# Patient Record
Sex: Female | Born: 1960
Health system: Southern US, Community
[De-identification: ages and names within clinical notes are randomized; demographics above are authoritative.]

## PROBLEM LIST (undated history)

## (undated) DIAGNOSIS — Z800 Family history of malignant neoplasm of digestive organs: Secondary | ICD-10-CM

## (undated) DIAGNOSIS — Z86018 Personal history of other benign neoplasm: Secondary | ICD-10-CM

## (undated) DIAGNOSIS — Z860100 Personal history of colon polyps, unspecified: Secondary | ICD-10-CM

## (undated) DIAGNOSIS — Z803 Family history of malignant neoplasm of breast: Secondary | ICD-10-CM

## (undated) DIAGNOSIS — E039 Hypothyroidism, unspecified: Secondary | ICD-10-CM

## (undated) DIAGNOSIS — E042 Nontoxic multinodular goiter: Secondary | ICD-10-CM

## (undated) DIAGNOSIS — S83206A Unspecified tear of unspecified meniscus, current injury, right knee, initial encounter: Secondary | ICD-10-CM

## (undated) DIAGNOSIS — Z9289 Personal history of other medical treatment: Secondary | ICD-10-CM

## (undated) DIAGNOSIS — E785 Hyperlipidemia, unspecified: Secondary | ICD-10-CM

## (undated) DIAGNOSIS — Z872 Personal history of diseases of the skin and subcutaneous tissue: Secondary | ICD-10-CM

## (undated) DIAGNOSIS — M1990 Unspecified osteoarthritis, unspecified site: Secondary | ICD-10-CM

## (undated) DIAGNOSIS — Z889 Allergy status to unspecified drugs, medicaments and biological substances status: Secondary | ICD-10-CM

## (undated) HISTORY — DX: Family history of malignant neoplasm of digestive organs: Z80.0

## (undated) HISTORY — DX: Unspecified osteoarthritis, unspecified site: M19.90

## (undated) HISTORY — PX: TONSILLECTOMY: SUR1361

## (undated) HISTORY — DX: Personal history of colonic polyps: Z86.010

## (undated) HISTORY — PX: KNEE SURGERY: SHX244

## (undated) HISTORY — PX: WISDOM TOOTH EXTRACTION: SHX21

## (undated) HISTORY — PX: KNEE ARTHROSCOPY: SUR90

## (undated) HISTORY — DX: Personal history of other medical treatment: Z92.89

## (undated) HISTORY — DX: Family history of malignant neoplasm of breast: Z80.3

## (undated) HISTORY — PX: COLONOSCOPY: SHX5424

## (undated) HISTORY — DX: Personal history of colon polyps, unspecified: Z86.0100

## (undated) HISTORY — DX: Nontoxic multinodular goiter: E04.2

---

## 1998-10-19 ENCOUNTER — Ambulatory Visit (HOSPITAL_COMMUNITY): Admission: RE | Admit: 1998-10-19 | Discharge: 1998-10-19 | Payer: Self-pay | Admitting: Gastroenterology

## 1998-12-24 ENCOUNTER — Ambulatory Visit (HOSPITAL_BASED_OUTPATIENT_CLINIC_OR_DEPARTMENT_OTHER): Admission: RE | Admit: 1998-12-24 | Discharge: 1998-12-24 | Payer: Self-pay | Admitting: Orthopedic Surgery

## 1999-11-04 ENCOUNTER — Other Ambulatory Visit: Admission: RE | Admit: 1999-11-04 | Discharge: 1999-11-04 | Payer: Self-pay | Admitting: Family Medicine

## 1999-11-12 ENCOUNTER — Encounter: Payer: Self-pay | Admitting: Family Medicine

## 1999-11-12 ENCOUNTER — Ambulatory Visit (HOSPITAL_COMMUNITY): Admission: RE | Admit: 1999-11-12 | Discharge: 1999-11-12 | Payer: Self-pay | Admitting: Family Medicine

## 2000-09-12 ENCOUNTER — Ambulatory Visit (HOSPITAL_COMMUNITY): Admission: RE | Admit: 2000-09-12 | Discharge: 2000-09-12 | Payer: Self-pay | Admitting: Endocrinology

## 2000-09-12 ENCOUNTER — Encounter: Payer: Self-pay | Admitting: Endocrinology

## 2000-11-03 ENCOUNTER — Other Ambulatory Visit: Admission: RE | Admit: 2000-11-03 | Discharge: 2000-11-03 | Payer: Self-pay | Admitting: Family Medicine

## 2000-12-02 ENCOUNTER — Inpatient Hospital Stay (HOSPITAL_COMMUNITY): Admission: AD | Admit: 2000-12-02 | Discharge: 2000-12-07 | Payer: Self-pay | Admitting: Thoracic Surgery

## 2000-12-04 HISTORY — PX: OTHER SURGICAL HISTORY: SHX169

## 2000-12-19 ENCOUNTER — Ambulatory Visit (HOSPITAL_COMMUNITY): Admission: RE | Admit: 2000-12-19 | Discharge: 2000-12-19 | Payer: Self-pay | Admitting: Gastroenterology

## 2001-11-08 ENCOUNTER — Encounter: Payer: Self-pay | Admitting: Endocrinology

## 2001-11-08 ENCOUNTER — Ambulatory Visit (HOSPITAL_COMMUNITY): Admission: RE | Admit: 2001-11-08 | Discharge: 2001-11-08 | Payer: Self-pay | Admitting: Endocrinology

## 2001-11-13 ENCOUNTER — Other Ambulatory Visit: Admission: RE | Admit: 2001-11-13 | Discharge: 2001-11-13 | Payer: Self-pay | Admitting: Family Medicine

## 2002-07-10 ENCOUNTER — Encounter: Payer: Self-pay | Admitting: Endocrinology

## 2002-07-10 ENCOUNTER — Ambulatory Visit (HOSPITAL_COMMUNITY): Admission: RE | Admit: 2002-07-10 | Discharge: 2002-07-10 | Payer: Self-pay | Admitting: Endocrinology

## 2002-11-15 ENCOUNTER — Other Ambulatory Visit: Admission: RE | Admit: 2002-11-15 | Discharge: 2002-11-15 | Payer: Self-pay | Admitting: Family Medicine

## 2002-12-23 ENCOUNTER — Ambulatory Visit (HOSPITAL_COMMUNITY): Admission: RE | Admit: 2002-12-23 | Discharge: 2002-12-23 | Payer: Self-pay | Admitting: Gastroenterology

## 2003-11-17 ENCOUNTER — Other Ambulatory Visit: Admission: RE | Admit: 2003-11-17 | Discharge: 2003-11-17 | Payer: Self-pay | Admitting: Family Medicine

## 2004-07-05 ENCOUNTER — Ambulatory Visit (HOSPITAL_COMMUNITY): Admission: RE | Admit: 2004-07-05 | Discharge: 2004-07-05 | Payer: Self-pay | Admitting: Endocrinology

## 2005-03-04 ENCOUNTER — Other Ambulatory Visit: Admission: RE | Admit: 2005-03-04 | Discharge: 2005-03-04 | Payer: Self-pay | Admitting: Family Medicine

## 2005-08-19 ENCOUNTER — Ambulatory Visit (HOSPITAL_COMMUNITY): Admission: RE | Admit: 2005-08-19 | Discharge: 2005-08-19 | Payer: Self-pay | Admitting: Gastroenterology

## 2005-09-09 ENCOUNTER — Ambulatory Visit (HOSPITAL_COMMUNITY): Admission: RE | Admit: 2005-09-09 | Discharge: 2005-09-09 | Payer: Self-pay | Admitting: Endocrinology

## 2005-10-27 ENCOUNTER — Ambulatory Visit (HOSPITAL_COMMUNITY): Admission: RE | Admit: 2005-10-27 | Discharge: 2005-10-27 | Payer: Self-pay | Admitting: Family Medicine

## 2006-02-02 ENCOUNTER — Encounter: Payer: Self-pay | Admitting: Orthopedic Surgery

## 2006-02-02 ENCOUNTER — Ambulatory Visit (HOSPITAL_COMMUNITY): Admission: RE | Admit: 2006-02-02 | Discharge: 2006-02-02 | Payer: Self-pay | Admitting: Orthopedic Surgery

## 2006-03-09 ENCOUNTER — Other Ambulatory Visit: Admission: RE | Admit: 2006-03-09 | Discharge: 2006-03-09 | Payer: Self-pay | Admitting: Family Medicine

## 2006-03-14 ENCOUNTER — Ambulatory Visit: Payer: Self-pay | Admitting: Sports Medicine

## 2006-04-06 ENCOUNTER — Ambulatory Visit: Payer: Self-pay | Admitting: Sports Medicine

## 2006-06-20 ENCOUNTER — Ambulatory Visit: Payer: Self-pay | Admitting: Family Medicine

## 2006-06-28 ENCOUNTER — Ambulatory Visit: Payer: Self-pay | Admitting: Sports Medicine

## 2007-02-08 ENCOUNTER — Encounter (INDEPENDENT_AMBULATORY_CARE_PROVIDER_SITE_OTHER): Payer: Self-pay | Admitting: Plastic Surgery

## 2007-02-08 ENCOUNTER — Ambulatory Visit (HOSPITAL_BASED_OUTPATIENT_CLINIC_OR_DEPARTMENT_OTHER): Admission: RE | Admit: 2007-02-08 | Discharge: 2007-02-09 | Payer: Self-pay | Admitting: Plastic Surgery

## 2007-02-08 HISTORY — PX: BREAST REDUCTION SURGERY: SHX8

## 2007-10-16 ENCOUNTER — Ambulatory Visit (HOSPITAL_COMMUNITY): Admission: RE | Admit: 2007-10-16 | Discharge: 2007-10-16 | Payer: Self-pay | Admitting: Gastroenterology

## 2007-10-18 ENCOUNTER — Other Ambulatory Visit: Admission: RE | Admit: 2007-10-18 | Discharge: 2007-10-18 | Payer: Self-pay | Admitting: Family Medicine

## 2007-11-28 ENCOUNTER — Ambulatory Visit (HOSPITAL_COMMUNITY): Admission: RE | Admit: 2007-11-28 | Discharge: 2007-11-28 | Payer: Self-pay | Admitting: Endocrinology

## 2008-01-15 ENCOUNTER — Emergency Department (HOSPITAL_COMMUNITY): Admission: EM | Admit: 2008-01-15 | Discharge: 2008-01-15 | Payer: Self-pay | Admitting: Family Medicine

## 2008-01-31 ENCOUNTER — Emergency Department (HOSPITAL_COMMUNITY): Admission: EM | Admit: 2008-01-31 | Discharge: 2008-01-31 | Payer: Self-pay | Admitting: Family Medicine

## 2008-04-16 ENCOUNTER — Ambulatory Visit: Payer: Self-pay | Admitting: Vascular Surgery

## 2008-12-10 ENCOUNTER — Encounter: Admission: RE | Admit: 2008-12-10 | Discharge: 2008-12-10 | Payer: Self-pay | Admitting: Endocrinology

## 2009-10-13 ENCOUNTER — Ambulatory Visit (HOSPITAL_COMMUNITY): Admission: RE | Admit: 2009-10-13 | Discharge: 2009-10-13 | Payer: Self-pay | Admitting: Orthopedic Surgery

## 2010-09-12 ENCOUNTER — Encounter: Payer: Self-pay | Admitting: Orthopedic Surgery

## 2010-12-31 ENCOUNTER — Other Ambulatory Visit: Payer: Self-pay | Admitting: Obstetrics and Gynecology

## 2011-01-04 NOTE — Op Note (Signed)
Erin [name], Erin [name]                ACCOUNT NO.:  [id number]   MEDICAL RECORD NO.:  [id number]          PATIENT TYPE:  AMB   LOCATION:  DSC                          FACILITY:  [facility]   PHYSICIAN:  Etter Sjogren, M.D.     DATE OF BIRTH:  09/20/1960   DATE OF PROCEDURE:  02/08/2007  DATE OF DISCHARGE:                               OPERATIVE REPORT   PREOPERATIVE DIAGNOSIS:  Bilateral macromastia with upper back, neck,  and shoulder pain.   POSTOPERATIVE DIAGNOSIS:  Bilateral macromastia with upper back, neck,  and shoulder pain.   PROCEDURE PERFORMED:  Bilateral breast reduction surgery.   SURGEON:  Etter Sjogren, M.D.   ASSISTANT SURGEON:  Pleas Patricia, M.D.   ANESTHESIA:  General.   ESTIMATED BLOOD LOSS:  225 mL.   CLINICAL NOTE:  A 50 year old woman who desires breast reduction.  She  complains of upper back, neck, and shoulder pain, breast and shoulder  grooving.  The procedure was discussed with her in detail.  She wished  to be a C cup but it was doubtful we could measure quite that small.  However, she was told that we would make her as small as we could.  Her  left breast was slightly larger than the right.  She understood the  initial procedure and the risks.  The informed consent form was  discussed with her in detail.  She understood the risks and possible  complications and wished to proceed.   DESCRIPTION OF PROCEDURE:  The patient was placed in a full standing  position in the holding area and marked for the breast reduction.  She  was taken to the operating room and placed supine after successful  induction of general anesthesia, prepped with Betadine and draped with  sterile drapes.  The 8-cm wide inferior nipple-areolar pedicles were  designed.  A 33-mm marker marked the new nipple-areolar complex.  An  incision was made.  The inferior nipple-areolar pedicle was de-  epithelialized.  The pedicles were then isolated from the surrounding  tissue, beveling in  an outward direction medially and laterally in order  to insure a broader detachment at the level of the chest wall that was  present at the level of the skin.  Resections were performed from medial  to central to lateral.  A total of 654 grams from the smaller right side  and 692 grams from the larger left side.  Thorough irrigation with  saline and meticulous hemostasis with electrocautery.  Excellent  hemostasis having been confirmed at the closure with a 2-0 Vicryl  inverted deep suture in the inverted T position, a 3-0 Monocryl  interrupted inverted deep dermal sutures, and running 4-0 Monocryl  subcuticular suture.  A measurement was then taken 5-cm up from the  inframammary crease.  A 38 mL marker used to mark the nipple-areolar  site.  The tissue was resected.  The nipple-areolar complex was brought  through this opening.  They were inspected.  They had an excellent color  and bright red bleeding around the periphery, consistent with viability.  Nipple-areolar end-settings 3-0 Monocryl interrupted  inverted deep  sutures and 4-0 Monocryl running subcuticular suture.  Steri-Strips, a  dry sterile dressing and circumferential splint applied.   She was transferred to the recovery room stable, having tolerated the  procedure well.   DISPOSITION:  She will be observed in the RCC overnight.      Etter Sjogren, M.D.  Electronically Signed     DB/MEDQ  D:  02/08/2007  T:  02/09/2007  Job:  [id number]

## 2011-01-04 NOTE — Op Note (Signed)
[name], [name]                ACCOUNT NO.:  [id number]   MEDICAL RECORD NO.:  [id number]          PATIENT TYPE:  AMB   LOCATION:  ENDO                         FACILITY:  [facility]   PHYSICIAN:  John C. Madilyn Fireman, M.D.    DATE OF BIRTH:  04/07/1961   DATE OF PROCEDURE:  10/16/2007  DATE OF DISCHARGE:                               OPERATIVE REPORT   PROCEDURE:  Colonoscopy.   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   PROCEDURE:  The patient was placed in the left lateral decubitus  position and placed on pulse monitor, with continuous low-flow oxygen  delivered by nasal cannula.  She was sedated with 100 mcg IV fentanyl  and 8 mg IV Versed.  The Olympus video colonoscope was inserted into the  rectum and advanced to the cecum, confirmed by transillumination at  McBurney's point visualization of the ileocecal valve and the  appendiceal orifice.  The prep was excellent.  The cecum, ascending,  transverse, descending, sigmoid, and rectum all appeared normal, with no  masses, polyps, diverticula, or other mucosal abnormalities.  The scope  was then withdrawn and the patient returned to the recovery room in  stable condition.  She tolerated the procedure well, and there were no  immediate complications.   IMPRESSION:  Normal study.   PLAN:  Will proceed with repeat colonoscopy in 2 years.           ______________________________  Everardo All Madilyn Fireman, M.D.     JCH/MEDQ  D:  10/16/2007  T:  10/16/2007  Job:  [id number]   cc:   Caryn Bee L. Little, M.D.  Fax: [phone]

## 2011-01-04 NOTE — Procedures (Signed)
DUPLEX DEEP VENOUS EXAM - LOWER EXTREMITY   INDICATION:  Left leg varicose veins.   HISTORY:  Edema:  Swelling since recent trauma.  Trauma/Surgery:  Trauma to the left leg in May, 2009.  The patient had  two left knee arthroscopies over five years ago.  Pain:  Ache in left leg.  PE:  No.  Previous DVT:  No.  Anticoagulants:  No.  Other:  Small spider veins in left leg.   DUPLEX EXAM:                CFV   SFV   PopV  PTV    GSV                R  L  R  L  R  L  R   L  R  L  Thrombosis    o  o     o     o      o     o  Spontaneous   +  +     +     +      +     +  Phasic        +  +     +     +      +     +  Augmentation  +  +     +     +      +     +  Compressible  +  +     +     +      +     +  Competent     +  +     +     +      +     +   Legend:  + - yes  o - no  p - partial  D - decreased   IMPRESSION:  No evidence of left leg deep venous thrombosis, superficial  vein thrombus, baker's cyst, or venous reflux.    _____________________________  Larina Earthly, M.D.   MC/MEDQ  D:  04/16/2008  T:  04/16/2008  Job:  [id number]

## 2011-01-04 NOTE — Consult Note (Signed)
NEW PATIENT CONSULTATION   [name], Erin  DOB:  1961-06-20                                       04/16/2008  CHART#:[id number]   The patient presents today for evaluation of left leg discomfort.  She  reports striking her pretibial area on a tube in May and since that time  has had difficulty with pain and swelling over this area.  She describes  this as a generalized ache over the pretibial area.  She does not have  any open wounds at this area.  She has had no fracture from x-ray.  She  has had prior surgeries, arthroscopies to her left knee and also at age  31 had growth plate surgery to her left knee.  She has not been  experiencing significant swelling.  She does have concern regarding mild  spider vein telangiectasia.   PHYSICAL EXAMINATION:  On physical exam a well-developed, well-nourished  female appearing stated age of 50.  Her right dorsalis pedis pulse is 2+  as is her popliteal pulse and femoral pulse.  On the left she has a  palpable femoral pulse and I do not palpate popliteal or pedal pulses.  The foot is well-perfused and warm.  She does not have any swelling and  no varicose veins.  She does have some mild scattered telangiectasia.   She underwent noninvasive vascular laboratory studies in our office.  Her venous study revealed no evidence of deep venous thrombosis and no  evidence of valvular reflux with totally normal venous study.  On her  arterial exam she did have slightly lower pulses in her left leg than  right but her ankle arm index was normal at 1.0.  She did have biphasic  and triphasic signals at the posterior tibia and dorsalis pedis  respectfully.  I discussed this with the patient.  I am unclear as to  the etiology of her leg discomfort but do not see any evidence of  significant arterial or venous pathology.  She was reassured with this  discussion and will see [place] again on an as-needed basis.   Larina Earthly, M.D.  Electronically Signed   TFE/MEDQ  D:  04/16/2008  T:  04/17/2008  Job:  [id number]   cc:   Caryn Bee L. Little, M.D.

## 2011-01-07 NOTE — Procedures (Signed)
[facility]  Patient:    Erin Hall, Erin Hall                         MRN: [id number] Proc. Date: 12/19/00 Adm. Date:  [id number] Attending:  Louie Bun CC:         Anna Genre. Little, M.D.   Procedure Report  PROCEDURE:  Colonoscopy.  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  DESCRIPTION OF PROCEDURE:  The patient was placed in the left lateral decubitus position and placed on the pulse monitor with continuous low-flow oxygen delivered by nasal cannula.  She was sedated with 70 mg IV Demerol and 9 mg IV Versed.  The Olympus video colonoscope was inserted into the rectum and advanced to the cecum, confirmed by transillumination at McBurneys point and visualization of the ileocecal valve and appendiceal orifice.  The prep was fairly good.  There was a moderate amount of semisolid and liquid stool, but this was adequately lavaged and suctioned until an adequate view could be achieved.  The cecum, ascending, transverse, descending, sigmoid, and rectum all appeared normal with no masses, polyps, diverticula, or other mucosal abnormalities.  Retroflex view of the anus did reveal some small internal hemorrhoids with no stigma of hemorrhage.  The colonoscope was then withdrawn and the patient returned to the recovery room in stable condition.  She tolerated the procedure well, and there were no immediate complications.  IMPRESSION:  Small internal hemorrhoids, otherwise normal colonoscopy.  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

## 2011-01-07 NOTE — Op Note (Signed)
   NAMERAGHAD, Erin                            ACCOUNT NO.:  [id number]   MEDICAL RECORD NO.:  [id number]                   PATIENT TYPE:  AMB   LOCATION:  ENDO                                 FACILITY:  [facility]   PHYSICIAN:  John C. Madilyn Fireman, M.D.                 DATE OF BIRTH:  Jan 14, 1961   DATE OF PROCEDURE:  12/23/2002  DATE OF DISCHARGE:                                 OPERATIVE REPORT   PROCEDURE:  Colonoscopy.   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   DESCRIPTION OF PROCEDURE:  The patient was placed in the left lateral  decubitus position then placed on the pulse monitor with continuous low flow  oxygen delivered by nasal cannula. She was sedated with 100 mcg IV fentanyl  and 8 mg IV Versed. The Olympus video colonoscope was inserted into the  rectum and advanced to the cecum, confirmed by transillumination at  McBurney's point and visualization of the ileocecal valve and appendiceal  orifice. The prep was excellent. The cecum, ascending, transverse,  descending and sigmoid colon all appeared normal with no masses, polyps,  diverticula or other mucosal abnormalities. The rectum likewise appeared  normal and retroflexed view of the anus revealed no obvious internal  hemorrhoids. The colonoscope was then withdrawn and the patient returned to  the recovery room in stable condition. The patient tolerated the procedure  well and there were no immediate complications.   IMPRESSION:  Normal colonoscopy.   PLAN:  Based on her family history will repeat colonoscopy in two years.                                               John C. Madilyn Fireman, M.D.    JCH/MEDQ  D:  12/23/2002  T:  12/23/2002  Job:  [id number]   cc:   Caryn Bee L. Little, M.D.  [address]  [place]  [place] [address]  Fax: [phone]

## 2011-01-07 NOTE — Op Note (Signed)
Erin Hall, Erin Hall                ACCOUNT NO.:  [id number]   MEDICAL RECORD NO.:  [id number]          PATIENT TYPE:  AMB   LOCATION:  ENDO                         FACILITY:  [facility]   PHYSICIAN:  John C. Madilyn Fireman, M.D.    DATE OF BIRTH:  [birth year]   DATE OF PROCEDURE:  08/19/2005  DATE OF DISCHARGE:                                 OPERATIVE REPORT   PROCEDURE:  Colonoscopy.   INDICATIONS FOR PROCEDURE:  Family history of colon cancer meeting [place]  criteria for Lynch syndrome.   DESCRIPTION OF PROCEDURE:  The patient was placed in the left lateral  decubitus position and placed on the pulse monitor with continuous low-flow  oxygen delivered by nasal cannula. She was sedated with 100 mcg IV fentanyl  and 10 mg IV Versed. The Olympus video colonoscope was inserted into the  rectum and advanced to the cecum, confirmed by transillumination at  McBurney's point and visualization of the ileocecal valve and appendiceal  orifice. The prep was excellent. The cecum, ascending, transverse descending  and sigmoid colon all appeared normal with  no masses, polyps, diverticula  or other mucosal abnormalities. The rectum likewise appeared normal and  retroflexed view of the anus revealed small internal hemorrhoids. The scope  was then withdrawn and the patient returned to the recovery room in stable  condition. She tolerated the procedure well. There were no immediate  complications.   IMPRESSION:  Normal colonoscopy.   PLAN:  Repeat study in 2 years.           ______________________________  Everardo All Madilyn Fireman, M.D.     JCH/MEDQ  D:  08/19/2005  T:  08/19/2005  Job:  [id number]   cc:   Caryn Bee L. Little, M.D.  Fax: [phone]

## 2011-01-07 NOTE — H&P (Signed)
[place]. [facility]  Patient:    Erin Hall, Erin Hall                         MRN: [id number] Adm. Date:  [id number] Attending:  Cameron Proud CC:         Anna Genre. Little, M.D.   History and Physical  PREOPERATIVE DIAGNOSIS:  Right thigh abscess secondary to dog bite.  POSTOPERATIVE DIAGNOSIS:  Right thigh abscess secondary to dog bite.  OPERATION:  Incision and drainage of right thigh abscess.  SURGEON:  D. Karle Plumber, M.D.  ANESTHESIA:  General.  DESCRIPTION OF PROCEDURE:  After general anesthesia, the right medial thigh was prepped and draped in the usual sterile manner.  The patient sustained a dog bite three weeks ago and, despite antibiotics, it had started increasing in size and was now abscessed.  It was prepped and draped in the usual sterile manner, and then an incision was made over the abscess of approximately 2 cm and dissection was carried down in the subcutaneous tissue. Approximately 20-30 cc of pus was removed.  This was sent for aerobic and anaerobic cultures.  The wound was then packed with iodoform, and then a dry and sterile dressing was applied.  The patient returned to the recovery room in stable condition. DD:  12/04/00 TD:  12/04/00 Job: [id number]

## 2011-01-07 NOTE — Discharge Summary (Signed)
[facility]  Patient:    Erin Hall, Erin Hall                         MRN: [id number] Adm. Date:  [id number] Disc. Date: [id number] Attending:  Cameron Proud Dictator:   Sherrie George, P.A. CC:         Fransisco Hertz, M.D.  Lacretia Leigh. Ninetta Lights, M.D.  Anna Genre Little, M.D.   Discharge Summary  DATE OF BIRTH:  1960-10-18  ADMISSION DIAGNOSES: 1. Cellulitis right thigh secondary to dog bite. 2. Hypothyroidism. 3. History of osteomalacia of the knees.  DISCHARGE DIAGNOSES: 1. Cellulitis right thigh secondary to dog bite. 2. Hypothyroidism. 3. History of osteomalacia of the knees.  PROCEDURES:  Incision and drainage right thigh abscess December 08, 2000.  HISTORY OF PRESENT ILLNESS:  The patient is a 50 year old black female, otherwise healthy, who presented with a three-week history of a dog bite of the right thigh.  At the time the patient had not been on oral antibiotics; however, she cleaned the area and noticed a day later that the area was slightly raised.  It became more erythematous and developed more edema.  She was evaluated by Dr. Edwyna Shell and started on Augmentin and was subsequently admitted for IV antibiotics on December 02, 2000.  PAST MEDICAL HISTORY: 1. Osteomalacia of the left knee. 2. Hypothyroidism. 3. Past history of arthroscopic knee surgery of the left leg by Dr. Madelon Lips.  ADMISSION MEDICATIONS: 1. Synthroid 80 mcg q.d. 2. Ortho-Novum birth control pills 1 q.d. 3. Multivitamin 1 q.d.  ALLERGIES:  None known.  For further history and physical, please see the dictated note.  HOSPITAL COURSE:  The patient was admitted.  She had a rounded area in the midportion of the right thigh which was raised and discolored.  It also felt warmer.  There was no drainage in the area.  The patient was placed on IV Zosyn 4.5 g IV q.6h.  It did not become any worse, but by December 05, 2000, it was Dr. Remo Lipps opinion that it would  require incision and drainage.  The patient was also seen in consultation by Dr. Darlina Sicilian of the infectious disease service.  He was concerned that it could be a ______ , and he followed the patient during the hospital course and agreed with the current treatment.  The patient underwent I&D of the incision on December 04, 2000.  The patient tolerated the procedure well and returned to the floor.  The area was packed open.  Cultures obtained at the time of the I&D have shown no growth so far.  The wounds continue to do well, and infectious disease saw the patient again on December 06, 2000.  It was Dr. Tyrone Apple opinion that she could go home in the a.m. on Augmentin for two weeks.  This was discussed with Dr. Edwyna Shell, who agreed, and the patient was subsequently scheduled for discharge home in the a.m. on December 07, 2000.  DISCHARGE MEDICATIONS:  Will be her preadmission medicines which included the Synthroid, Ortho-Novum, and a multivitamin.  She will also be given: 1. Percocet 1-2 p.o. q.4h. p.r.n. 2. Augmentin 875 mg p.o. q.12h.  FOLLOW-UP:  She will return to see Dr. Edwyna Shell on Tuesday, December 12, 2000, at 3:30 p.m., with further workup and treatment as indicated. DD:  12/06/00 TD:  12/07/00 Job: [id number]

## 2011-01-07 NOTE — H&P (Signed)
[place]. [facility]  Patient:    Erin Hall, Erin Hall                         MRN: [id number] Adm. Date:  12/02/00 Attending:  D. Karle Plumber, M.D. Dictator:   Durenda Age, P.A.-C. CC:         Anna Genre. Little, M.D.   History and Physical  DATE OF BIRTH:  May 25, 1961  CHIEF COMPLAINT:  Right thigh cellulitis.  HISTORY OF PRESENT ILLNESS:  [name] is a pleasant 50 year old black female, otherwise healthy, who presented with a three-week history of dog bite in the right thigh.  At the time, the patient did not take any oral antibiotics. However, she cleaned the area and noticed a day later that that area was slightly raised.  She scratched that area with her nails.  Shortly after that, that area became more erythematous, and was accompanied by edema.  Symptoms did not resolve and the patient sought medical attention.  She was started on Augmentin on November 27, 2000, but apparently, this medication did not produce any significant response.  This area became more raised, and had a surrounding border, erythematous, as well.  Because of these clinical signs, Dr. Edwyna Shell upon evaluation recommended that she be admitted for IV antibiotics and IV fluids in order to resolve the symptoms.  The patient agreed.  PAST MEDICAL HISTORY: 1. Previous history of osteomalacia in the left knee. 2. Hypothyroidism.  PAST SURGICAL HISTORY:  Status post arthroscopic surgery in the left leg secondary to osteomalacia by Dr. Madelon Lips.  MEDICATIONS: 1. Synthroid 80 mcg q.d. 2. Ortho-Novum birth control pills q.d. 3. Multivitamins q.d.  ALLERGIES:  No known drug allergies.  REVIEW OF SYSTEMS:  See HPI and past medical history for significant positives.  No diabetes, kidney disease, asthma, COPD, TIA, CVA, syncope, presyncope, amaurosis fugax, CAD, angina, arrhythmia, MI, PE, DVT, GI bleed, dysuria, or hematuria.  No GERD symptoms.  No CHF or hypertension.  She denies any  fever, chills.  She occasionally experiences nausea, but no vomiting.  No diarrhea.  No GI bleed.  FAMILY HISTORY:  Mother alive, breast cancer.  Father alive, colon cancer.  SOCIAL HISTORY:  Single, no children.  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  She denies any tobacco or alcohol intake.  PHYSICAL EXAMINATION:  GENERAL:  A well-developed, well-nourished 50 year old black female in no acute distress, alert and oriented x 3.  VITAL SIGNS:  Blood pressure 110/80, pulse 88, respirations 18.  HEENT:  Normocephalic, atraumatic.  PERRLA, EOMI.  Funduscopic exam within normal limits.  NECK:  Supple, no JVD, bruits, thyromegaly, or lymphadenopathy.  CHEST:  Symmetrical on inspirations.  No wheezes, rhonchi, or rales. No axillary or supraclavicular lymphadenopathy.  Respirations nonlabored.  CARDIOVASCULAR:  Regular rate and rhythm.  No murmurs, rubs, or gallops.  ABDOMEN:  Soft, nontender.  Bowel sounds x 4.  No hepatosplenomegaly, masses palpable, or abdominal bruits.  GU/RECTAL:  Deferred.  EXTREMITIES:  No clubbing, cyanosis, or edema.  SKIN:  No ulcerations, but in the right thigh, there is a 3 to 4 cm raised cellulitic area, tender to palpation, with elevated temperature, no drainage, and with "target-like" surrounding borders.  These borders extend about 7 to 8 cm from the centrally raised cellulitic area.  Peripheral pulses:  Carotid 2+ bilaterally, femoral, popliteal, dorsalis pedis, and posterior tibialis 2+ bilaterally.  Neurologic grossly normal.  Normal gait.  DTRs 2+ bilaterally. Muscle strength 5/5.  ASSESSMENT AND PLAN:  A 50 year old black female with cellulitic right thigh after a dog bite, admitted for IV Zosyn 4.5 g q.4-6h., and IV fluids. Dr. Edwyna Shell has seen and evaluated this patient prior to the admission and has explained the risks and benefits involving the procedure and the patient has agreed to continue. DD:  12/01/00 TD:   12/01/00 Job: [id number]

## 2011-01-24 ENCOUNTER — Other Ambulatory Visit: Payer: Self-pay | Admitting: Endocrinology

## 2011-01-24 DIAGNOSIS — E049 Nontoxic goiter, unspecified: Secondary | ICD-10-CM

## 2011-01-26 ENCOUNTER — Other Ambulatory Visit (HOSPITAL_COMMUNITY): Payer: Self-pay | Admitting: Endocrinology

## 2011-01-26 DIAGNOSIS — E049 Nontoxic goiter, unspecified: Secondary | ICD-10-CM

## 2011-01-31 ENCOUNTER — Ambulatory Visit (HOSPITAL_COMMUNITY)
Admission: RE | Admit: 2011-01-31 | Discharge: 2011-01-31 | Disposition: A | Discharge status: Home / Self Care | Payer: Commercial Managed Care - PPO | Source: Ambulatory Visit | Attending: Endocrinology | Admitting: Endocrinology

## 2011-01-31 DIAGNOSIS — E042 Nontoxic multinodular goiter: Secondary | ICD-10-CM | POA: Insufficient documentation

## 2011-01-31 DIAGNOSIS — E049 Nontoxic goiter, unspecified: Secondary | ICD-10-CM

## 2011-02-01 ENCOUNTER — Other Ambulatory Visit (HOSPITAL_COMMUNITY): Payer: Self-pay

## 2011-02-02 ENCOUNTER — Other Ambulatory Visit (HOSPITAL_COMMUNITY): Payer: Self-pay

## 2011-02-11 ENCOUNTER — Other Ambulatory Visit (HOSPITAL_COMMUNITY): Payer: Self-pay | Admitting: Endocrinology

## 2011-02-11 ENCOUNTER — Other Ambulatory Visit: Payer: Self-pay | Admitting: Endocrinology

## 2011-02-11 DIAGNOSIS — E041 Nontoxic single thyroid nodule: Secondary | ICD-10-CM

## 2011-02-18 ENCOUNTER — Other Ambulatory Visit: Payer: Self-pay | Admitting: Diagnostic Radiology

## 2011-02-18 ENCOUNTER — Ambulatory Visit (HOSPITAL_COMMUNITY)
Admission: RE | Admit: 2011-02-18 | Discharge: 2011-02-18 | Disposition: A | Discharge status: Home / Self Care | Payer: 59 | Source: Ambulatory Visit | Attending: Endocrinology | Admitting: Endocrinology

## 2011-02-18 DIAGNOSIS — E042 Nontoxic multinodular goiter: Secondary | ICD-10-CM | POA: Insufficient documentation

## 2011-02-18 DIAGNOSIS — E041 Nontoxic single thyroid nodule: Secondary | ICD-10-CM

## 2011-05-19 LAB — POCT RAPID STREP A: Streptococcus, Group A Screen (Direct): NEGATIVE

## 2011-09-27 ENCOUNTER — Other Ambulatory Visit: Payer: Self-pay | Admitting: Endodontics

## 2011-12-19 ENCOUNTER — Other Ambulatory Visit (HOSPITAL_COMMUNITY): Payer: Self-pay | Admitting: Orthopedic Surgery

## 2011-12-19 DIAGNOSIS — M25562 Pain in left knee: Secondary | ICD-10-CM

## 2011-12-22 ENCOUNTER — Ambulatory Visit (HOSPITAL_COMMUNITY)
Admission: RE | Admit: 2011-12-22 | Discharge: 2011-12-22 | Disposition: A | Discharge status: Home / Self Care | Payer: 59 | Source: Ambulatory Visit | Attending: Orthopedic Surgery | Admitting: Orthopedic Surgery

## 2011-12-22 DIAGNOSIS — M7120 Synovial cyst of popliteal space [Baker], unspecified knee: Secondary | ICD-10-CM | POA: Insufficient documentation

## 2011-12-22 DIAGNOSIS — M23329 Other meniscus derangements, posterior horn of medial meniscus, unspecified knee: Secondary | ICD-10-CM | POA: Insufficient documentation

## 2011-12-22 DIAGNOSIS — M1710 Unilateral primary osteoarthritis, unspecified knee: Secondary | ICD-10-CM | POA: Insufficient documentation

## 2011-12-22 DIAGNOSIS — M25562 Pain in left knee: Secondary | ICD-10-CM

## 2012-01-12 ENCOUNTER — Other Ambulatory Visit (HOSPITAL_COMMUNITY): Payer: Self-pay | Admitting: Obstetrics and Gynecology

## 2012-01-12 DIAGNOSIS — D259 Leiomyoma of uterus, unspecified: Secondary | ICD-10-CM

## 2012-01-12 DIAGNOSIS — R140 Abdominal distension (gaseous): Secondary | ICD-10-CM

## 2012-01-18 ENCOUNTER — Ambulatory Visit (HOSPITAL_COMMUNITY)
Admission: RE | Admit: 2012-01-18 | Discharge: 2012-01-18 | Disposition: A | Discharge status: Home / Self Care | Payer: 59 | Source: Ambulatory Visit | Attending: Obstetrics and Gynecology | Admitting: Obstetrics and Gynecology

## 2012-01-18 DIAGNOSIS — R141 Gas pain: Secondary | ICD-10-CM | POA: Insufficient documentation

## 2012-01-18 DIAGNOSIS — R142 Eructation: Secondary | ICD-10-CM | POA: Insufficient documentation

## 2012-01-18 DIAGNOSIS — R140 Abdominal distension (gaseous): Secondary | ICD-10-CM

## 2012-01-18 DIAGNOSIS — D259 Leiomyoma of uterus, unspecified: Secondary | ICD-10-CM | POA: Insufficient documentation

## 2012-01-18 MED ORDER — GADOBENATE DIMEGLUMINE 529 MG/ML IV SOLN
15.0000 mL | Freq: Once | INTRAVENOUS | Status: AC | PRN
  Administered 2012-01-18: 15 mL via INTRAVENOUS

## 2012-01-26 ENCOUNTER — Other Ambulatory Visit (HOSPITAL_COMMUNITY): Payer: Self-pay | Admitting: Endocrinology

## 2012-01-26 ENCOUNTER — Other Ambulatory Visit: Payer: Self-pay | Admitting: Obstetrics and Gynecology

## 2012-01-26 DIAGNOSIS — D219 Benign neoplasm of connective and other soft tissue, unspecified: Secondary | ICD-10-CM

## 2012-01-26 DIAGNOSIS — E049 Nontoxic goiter, unspecified: Secondary | ICD-10-CM

## 2012-02-02 ENCOUNTER — Other Ambulatory Visit (HOSPITAL_COMMUNITY): Payer: 59

## 2012-02-03 ENCOUNTER — Other Ambulatory Visit (HOSPITAL_COMMUNITY): Payer: 59

## 2012-02-15 ENCOUNTER — Ambulatory Visit (HOSPITAL_COMMUNITY)
Admission: RE | Admit: 2012-02-15 | Discharge: 2012-02-15 | Disposition: A | Discharge status: Home / Self Care | Payer: 59 | Source: Ambulatory Visit | Attending: Endocrinology | Admitting: Endocrinology

## 2012-02-15 DIAGNOSIS — E049 Nontoxic goiter, unspecified: Secondary | ICD-10-CM | POA: Insufficient documentation

## 2012-02-22 ENCOUNTER — Ambulatory Visit
Admission: RE | Admit: 2012-02-22 | Discharge: 2012-02-22 | Disposition: A | Discharge status: Home / Self Care | Payer: 59 | Source: Ambulatory Visit | Attending: Obstetrics and Gynecology | Admitting: Obstetrics and Gynecology

## 2012-02-22 DIAGNOSIS — D219 Benign neoplasm of connective and other soft tissue, unspecified: Secondary | ICD-10-CM

## 2012-03-08 ENCOUNTER — Other Ambulatory Visit (HOSPITAL_COMMUNITY): Payer: Self-pay | Admitting: Interventional Radiology

## 2012-03-08 DIAGNOSIS — D219 Benign neoplasm of connective and other soft tissue, unspecified: Secondary | ICD-10-CM

## 2012-04-02 ENCOUNTER — Encounter (HOSPITAL_COMMUNITY): Payer: Self-pay | Admitting: Pharmacy Technician

## 2012-04-13 ENCOUNTER — Other Ambulatory Visit: Payer: Self-pay | Admitting: Radiology

## 2012-04-17 ENCOUNTER — Ambulatory Visit (HOSPITAL_COMMUNITY)
Admission: RE | Admit: 2012-04-17 | Discharge: 2012-04-18 | Disposition: A | Discharge status: Home / Self Care | Payer: 59 | Source: Ambulatory Visit | Attending: Interventional Radiology | Admitting: Interventional Radiology

## 2012-04-17 ENCOUNTER — Encounter (HOSPITAL_COMMUNITY): Payer: Self-pay

## 2012-04-17 VITALS — BP 93/47 | HR 111 | Temp 99.3°F | Resp 17 | Ht 63.0 in | Wt 173.7 lb

## 2012-04-17 DIAGNOSIS — D219 Benign neoplasm of connective and other soft tissue, unspecified: Secondary | ICD-10-CM

## 2012-04-17 DIAGNOSIS — R112 Nausea with vomiting, unspecified: Secondary | ICD-10-CM | POA: Insufficient documentation

## 2012-04-17 DIAGNOSIS — Z79899 Other long term (current) drug therapy: Secondary | ICD-10-CM | POA: Insufficient documentation

## 2012-04-17 DIAGNOSIS — E785 Hyperlipidemia, unspecified: Secondary | ICD-10-CM | POA: Insufficient documentation

## 2012-04-17 DIAGNOSIS — D259 Leiomyoma of uterus, unspecified: Secondary | ICD-10-CM | POA: Diagnosis present

## 2012-04-17 DIAGNOSIS — E039 Hypothyroidism, unspecified: Secondary | ICD-10-CM | POA: Insufficient documentation

## 2012-04-17 HISTORY — DX: Allergy status to unspecified drugs, medicaments and biological substances: Z88.9

## 2012-04-17 HISTORY — DX: Hypothyroidism, unspecified: E03.9

## 2012-04-17 HISTORY — DX: Hyperlipidemia, unspecified: E78.5

## 2012-04-17 HISTORY — PX: UTERINE ARTERY EMBOLIZATION: SHX2629

## 2012-04-17 LAB — CBC WITH DIFFERENTIAL/PLATELET
Basophils Absolute: 0 10*3/uL (ref 0.0–0.1)
Basophils Relative: 0 % (ref 0–1)
Eosinophils Absolute: 0.2 10*3/uL (ref 0.0–0.7)
Eosinophils Relative: 3 % (ref 0–5)
HCT: 42.3 % (ref 36.0–46.0)
MCH: 29.5 pg (ref 26.0–34.0)
MCHC: 35 g/dL (ref 30.0–36.0)
Monocytes Absolute: 0.4 10*3/uL (ref 0.1–1.0)
Neutro Abs: 2.5 10*3/uL (ref 1.7–7.7)
RDW: 12.5 % (ref 11.5–15.5)

## 2012-04-17 LAB — BASIC METABOLIC PANEL
BUN: 10 mg/dL (ref 6–23)
Chloride: 100 mEq/L (ref 96–112)
Creatinine, Ser: 0.69 mg/dL (ref 0.50–1.10)
GFR calc Af Amer: >90 mL/min (ref >90)

## 2012-04-17 MED ORDER — SODIUM CHLORIDE 0.9 % IV SOLN
Freq: Once | INTRAVENOUS | Status: AC
  Administered 2012-04-17: 10:00:00 via INTRAVENOUS

## 2012-04-17 MED ORDER — ONDANSETRON HCL 4 MG/2ML IJ SOLN
4.0000 mg | INTRAMUSCULAR | Status: DC | PRN
  Administered 2012-04-17: 4 mg via INTRAVENOUS
  Filled 2012-04-17: qty 2

## 2012-04-17 MED ORDER — FENTANYL CITRATE 0.05 MG/ML IJ SOLN
INTRAMUSCULAR | Status: AC
  Administered 2012-04-17: 10:00:00
  Filled 2012-04-17: qty 8

## 2012-04-17 MED ORDER — ONDANSETRON HCL 4 MG/2ML IJ SOLN: 4.0000 mg | Freq: Four times a day (QID) | INTRAMUSCULAR | Status: DC | PRN

## 2012-04-17 MED ORDER — SODIUM CHLORIDE 0.9 % IV SOLN: 250.0000 mL | INTRAVENOUS | Status: DC | PRN

## 2012-04-17 MED ORDER — MIDAZOLAM HCL 2 MG/2ML IJ SOLN
INTRAMUSCULAR | Status: AC
  Administered 2012-04-17: 10:00:00
  Filled 2012-04-17: qty 8

## 2012-04-17 MED ORDER — HYDROMORPHONE HCL PF 2 MG/ML IJ SOLN
INTRAMUSCULAR | Status: AC
  Administered 2012-04-17: 10:00:00
  Filled 2012-04-17: qty 1

## 2012-04-17 MED ORDER — PROMETHAZINE HCL 25 MG PO TABS
25.0000 mg | ORAL_TABLET | Freq: Three times a day (TID) | ORAL | Status: DC | PRN
  Administered 2012-04-17: 25 mg via ORAL
  Filled 2012-04-17: qty 1

## 2012-04-17 MED ORDER — DIPHENHYDRAMINE HCL 50 MG/ML IJ SOLN
12.5000 mg | Freq: Four times a day (QID) | INTRAMUSCULAR | Status: DC | PRN
  Administered 2012-04-17: 12.5 mg via INTRAVENOUS
  Filled 2012-04-17: qty 1

## 2012-04-17 MED ORDER — MIDAZOLAM HCL 5 MG/5ML IJ SOLN
INTRAMUSCULAR | Status: AC | PRN
Start: 2012-04-17 — End: 2012-04-17
  Administered 2012-04-17 (×4): 2 mg via INTRAVENOUS

## 2012-04-17 MED ORDER — CEFAZOLIN SODIUM 1-5 GM-% IV SOLN
1.0000 g | Freq: Once | INTRAVENOUS | Status: AC
  Administered 2012-04-17: 1 g via INTRAVENOUS
  Filled 2012-04-17: qty 50

## 2012-04-17 MED ORDER — SODIUM CHLORIDE 0.9 % IJ SOLN: 3.0000 mL | INTRAMUSCULAR | Status: DC | PRN

## 2012-04-17 MED ORDER — FENTANYL CITRATE 0.05 MG/ML IJ SOLN
INTRAMUSCULAR | Status: AC | PRN
  Administered 2012-04-17 (×3): 100 ug via INTRAVENOUS

## 2012-04-17 MED ORDER — KETOROLAC TROMETHAMINE 30 MG/ML IJ SOLN
30.0000 mg | INTRAMUSCULAR | Status: AC
  Administered 2012-04-17: 30 mg via INTRAVENOUS
  Filled 2012-04-17: qty 1

## 2012-04-17 MED ORDER — HYDROCODONE-ACETAMINOPHEN 5-325 MG PO TABS
1.0000 | ORAL_TABLET | ORAL | Status: DC | PRN
  Administered 2012-04-18: 2 via ORAL
  Filled 2012-04-17: qty 2

## 2012-04-17 MED ORDER — SODIUM CHLORIDE 0.9 % IJ SOLN: 9.0000 mL | INTRAMUSCULAR | Status: DC | PRN

## 2012-04-17 MED ORDER — ONDANSETRON HCL 4 MG/2ML IJ SOLN
4.0000 mg | Freq: Four times a day (QID) | INTRAMUSCULAR | Status: DC | PRN
  Administered 2012-04-17: 4 mg via INTRAVENOUS
  Filled 2012-04-17: qty 2

## 2012-04-17 MED ORDER — NALOXONE HCL 0.4 MG/ML IJ SOLN: 0.4000 mg | INTRAMUSCULAR | Status: DC | PRN

## 2012-04-17 MED ORDER — LIDOCAINE HCL 1 % IJ SOLN
INTRAMUSCULAR | Status: AC
  Administered 2012-04-17: 10:00:00
  Filled 2012-04-17: qty 20

## 2012-04-17 MED ORDER — HYDROMORPHONE HCL PF 1 MG/ML IJ SOLN
INTRAMUSCULAR | Status: AC | PRN
  Administered 2012-04-17: 2 mg via INTRAVENOUS

## 2012-04-17 MED ORDER — KETOROLAC TROMETHAMINE 30 MG/ML IJ SOLN
30.0000 mg | Freq: Four times a day (QID) | INTRAMUSCULAR | Status: DC
  Administered 2012-04-17 – 2012-04-18 (×5): 30 mg via INTRAVENOUS
  Filled 2012-04-17 (×8): qty 1

## 2012-04-17 MED ORDER — IOHEXOL 300 MG/ML  SOLN
150.0000 mL | Freq: Once | INTRAMUSCULAR | Status: AC | PRN
  Administered 2012-04-17: 100 mL via INTRA_ARTERIAL

## 2012-04-17 MED ORDER — DIPHENHYDRAMINE HCL 12.5 MG/5ML PO ELIX
12.5000 mg | ORAL_SOLUTION | Freq: Four times a day (QID) | ORAL | Status: DC | PRN
  Filled 2012-04-17: qty 5

## 2012-04-17 MED ORDER — DOCUSATE SODIUM 100 MG PO CAPS
100.0000 mg | ORAL_CAPSULE | Freq: Two times a day (BID) | ORAL | Status: DC
  Administered 2012-04-17 – 2012-04-18 (×3): 100 mg via ORAL
  Filled 2012-04-17 (×4): qty 1

## 2012-04-17 MED ORDER — NITROGLYCERIN 1 MG/10 ML FOR IR/CATH LAB
300.0000 ug | INTRA_ARTERIAL | Status: AC
  Administered 2012-04-17: 300 ug via INTRA_ARTERIAL
  Filled 2012-04-17: qty 10

## 2012-04-17 MED ORDER — ONDANSETRON HCL 4 MG/2ML IJ SOLN: 4.0000 mg | INTRAMUSCULAR | Status: DC | PRN

## 2012-04-17 MED ORDER — PROMETHAZINE HCL 25 MG RE SUPP: 25.0000 mg | Freq: Three times a day (TID) | RECTAL | Status: DC | PRN

## 2012-04-17 MED ORDER — SODIUM CHLORIDE 0.9 % IJ SOLN: 3.0000 mL | Freq: Two times a day (BID) | INTRAMUSCULAR | Status: DC

## 2012-04-17 MED ORDER — HYDROMORPHONE 0.3 MG/ML IV SOLN
INTRAVENOUS | Status: DC
  Administered 2012-04-17: 4.2 mg via INTRAVENOUS
  Administered 2012-04-17: 3 mg via INTRAVENOUS
  Administered 2012-04-17: 0.6 mg via INTRAVENOUS
  Administered 2012-04-17: 11:00:00 via INTRAVENOUS
  Administered 2012-04-18: 2.1 mg via INTRAVENOUS
  Administered 2012-04-18: 1.5 mg via INTRAVENOUS
  Filled 2012-04-17 (×2): qty 25

## 2012-04-17 NOTE — Procedures (Signed)
Successful bilateral [place]  No comp Stable Overnight recovery Full report in pacs

## 2012-04-17 NOTE — H&P (Signed)
Chief Complaint: Uterine fibroids  HPI: Erin Hall is an 51 y.o. female who was seen in consultation by Dr. Miles Costain for symptomatic uterine fibroids. After long discussion, they have agreed to schedule uterine fibroid embolization as treatment. She has been well and no new changes to her medical history or medications since being seen in clinic.  Past Medical History:  Past Medical History  Diagnosis Date  . Hypothyroidism   . Hyperlipidemia   . History of seasonal allergies     Past Surgical History:  Past Surgical History  Procedure Date  . Breast reduction surgery   . Knee surgery     Family History: History reviewed. No pertinent family history.  Social History:  reports that she has never smoked. She has never used smokeless tobacco. She reports that she does not drink alcohol or use illicit drugs.  Allergies: No Known Allergies  Medications: celecoxib (CELEBREX) 200 MG capsule (Taking) Sig - Route: Take 200 mg by mouth every evening. - Oral Class: Historical Med cetirizine (ZYRTEC) 10 MG tablet (Taking) Sig - Route: Take 10 mg by mouth every evening. - Oral Class: Historical Med cholecalciferol (VITAMIN D) 1000 UNITS tablet (Taking) Sig - Route: Take 1,000 Units by mouth every evening. - Oral Class: Historical Med fish oil-omega-3 fatty acids 1000 MG capsule (Taking) Sig - Route: Take 1 g by mouth every evening. - Oral Class: Historical Med levothyroxine (SYNTHROID, LEVOTHROID) 88 MCG tablet (Taking) Sig - Route: Take 88 mcg by mouth daily before breakfast. - Oral Class: Historical Med Multiple Vitamin (MULTIVITAMIN WITH MINERALS) TABS (Taking) Sig - Route: Take 1 tablet by mouth every evening. - Oral Class: Historical Med simvastatin (ZOCOR) 40 MG tablet (Taking) Sig - Route: Take 40 mg by mouth every evening. - Oral Class: Historical Med vitamin C (ASCORBIC ACID) 500 MG tablet (Taking) Sig - Route: Take 500 mg by mouth every evening   Please HPI for pertinent positives,  otherwise complete 10 system ROS negative.  Physical Exam: Last menstrual period 03/17/2012. There is no height or weight on file to calculate BMI.   General Appearance:  Alert, cooperative, no distress, appears stated age  Head:  Normocephalic, without obvious abnormality, atraumatic  ENT: Unremarkable  Neck: Supple, symmetrical, trachea midline, no adenopathy, thyroid: not enlarged, symmetric, no tenderness/mass/nodules  Lungs:   Clear to auscultation bilaterally, no w/r/r, respirations unlabored without use of accessory muscles.  Chest Wall:  No tenderness or deformity  Heart:  Regular rate and rhythm, S1, S2 normal, no murmur, rub or gallop. Carotids 2+ without bruit.  Abdomen:   Soft, non-tender, non distended. Bowel sounds active all four quadrants,  no masses, no organomegaly.  Pulses: 2+ and symmetric femoral and pedal.  Neurologic: Normal affect, no gross deficits.   Results for orders placed during the hospital encounter of 04/17/12 (from the past 48 hour(s))  CBC WITH DIFFERENTIAL     Status: Normal   Collection Time   04/17/12  8:20 AM      Component Value Range Comment   WBC 5.3  4.0 - 10.5 K/uL    RBC 5.01  3.87 - 5.11 MIL/uL    Hemoglobin 14.8  12.0 - 15.0 g/dL    HCT 16.1  09.6 - 04.5 %    MCV 84.4  78.0 - 100.0 fL    MCH 29.5  26.0 - 34.0 pg    MCHC 35.0  30.0 - 36.0 g/dL    RDW 40.9  81.1 - 91.4 %    Platelets  300  150 - 400 K/uL    Neutrophils Relative 48  43 - 77 %    Neutro Abs 2.5  1.7 - 7.7 K/uL    Lymphocytes Relative 41  12 - 46 %    Lymphs Abs 2.1  0.7 - 4.0 K/uL    Monocytes Relative 8  3 - 12 %    Monocytes Absolute 0.4  0.1 - 1.0 K/uL    Eosinophils Relative 3  0 - 5 %    Eosinophils Absolute 0.2  0.0 - 0.7 K/uL    Basophils Relative 0  0 - 1 %    Basophils Absolute 0.0  0.0 - 0.1 K/uL    No results found.  Assessment/Plan Symptomatic uterine fibroids. For UFE today, reviewed procedure with pt including risks and complications. Labs  reviewed/pending Plan for 23hr observation admission. Consent signed in chart  Brayton El PA-C 04/17/2012, 8:59 AM

## 2012-04-17 NOTE — Progress Notes (Signed)
Patient ID: Erin Hall, female   DOB: 07/29/1961, 51 y.o.   MRN: [id number]  Doing well this afternoon, post successful UFE. Mild expected pelvic cramping Min vaginal spotting noted this afternoon Femoral sites look good and pedal pulses intact. No comp Expect DC in AM

## 2012-04-18 DIAGNOSIS — D259 Leiomyoma of uterus, unspecified: Secondary | ICD-10-CM | POA: Diagnosis present

## 2012-04-18 MED ORDER — DSS 100 MG PO CAPS: 100.0000 mg | ORAL_CAPSULE | Freq: Two times a day (BID) | ORAL | Status: AC

## 2012-04-18 MED ORDER — IBUPROFEN 200 MG PO TABS: 600.0000 mg | ORAL_TABLET | Freq: Three times a day (TID) | ORAL | Status: AC

## 2012-04-18 MED ORDER — PROMETHAZINE HCL 25 MG PO TABS: 25.0000 mg | ORAL_TABLET | Freq: Three times a day (TID) | ORAL | Status: DC | PRN

## 2012-04-18 MED ORDER — HYDROCODONE-ACETAMINOPHEN 5-325 MG PO TABS: 1.0000 | ORAL_TABLET | ORAL | Status: AC | PRN

## 2012-04-18 NOTE — Progress Notes (Signed)
Subjective: Pt reports N/V last pm. Not able to tolerate reg diet. Also had to have I/O cath x 1 as could not void. Has now voided on own, but only 30cc. Reports crampy pain is stable and tolerable. Has been OOB walking halls  Objective: Physical Exam: BP 93/47  Pulse 111  Temp 99.3 F (37.4 C) (Oral)  Resp 17  Ht 5\' 3"  (1.6 m)  Wt 173 lb 11.6 oz (78.8 kg)  BMI 30.77 kg/m2  SpO2 97%  LMP 03/17/2012 (B)femoral groin stick sites clean, no hematoma. Excellent pulses Legs warm, normal distal cap refill. Lungs: CTA Heart: REg Abdomen: soft, ND, mildly tender suprapubic region.  Labs: CBC  Basename 04/17/12 0820  WBC 5.3  HGB 14.8  HCT 42.3  PLT 300   BMET  Basename 04/17/12 0820  NA 137  K 3.6  CL 100  CO2 26  GLUCOSE 90  BUN 10  CREATININE 0.69  CALCIUM 10.3   LFT No results found for this basename: PROT,ALBUMIN,AST,ALT,ALKPHOS,BILITOT,BILIDIR,IBILI,LIPASE in the last 72 hours PT/INR No results found for this basename: LABPROT:2,INR:2 in the last 72 hours   Studies/Results: Ir Uterine Fibroid Embolization Mod Sed  04/17/2012  *RADIOLOGY REPORT*  Clinical Data: Uterine fibroids, pelvic pain, bloating, urinary frequency  UTERINE FIBROID EMBOLIZATION  Date:  04/17/2012 09:30:00  Radiologist:  [facility]  Medications:  1 gram ancefadminstered within one hour of the procedure,500 mcg intra-arterial nitroglycerin, 30 mg Toradol IV, Versed Fentanyl for conscious sedation  Guidance:  Ultrasound fluoroscopic  Fluoroscopy time:  13.7 minutes  Sedation time:  120 minutes  Contrast volume:  100 ml Omnipaque-300  Complications:  No immediate  PROCEDURE/FINDINGS:  Informed consent was obtained from the patient following explanation of the procedure, risks, benefits and alternatives. The patient understands, agrees and consents for the procedure. All questions were addressed.  A time out was performed.  Maximal barrier sterile technique utilized including caps, mask, sterile gowns,  sterile gloves, large sterile drape, hand hygiene, and betadine prep.  Under sterile conditions and local anesthesia, rightcommon femoral artery access was performed with a micropuncture needle. Ultrasound was utilized for access.  Images were obtained for documentation.  A guide wire was advanced followed by a 5-French sheath.  A 5-French C2 catheter was utilized to select the contralateral left internal iliac artery.  Selective left internal iliac angiogram was performed.  The tortuous left uterine artery was identified.  Selective catheterization was performed of the left uterine artery with a microcatheter and micro guide wire.  A selective left uterine angiogram was performed.  This demonstrated patency of the left uterine artery.  Mild diffuse hypervascularity of the enlarged fibroid uterus.  Access was adequate for embolization.  In a similar fashion under sterile conditions and local anesthesia, left common femoral artery access was performed with a micropuncture needle.  Ultrasound was utilized for access.  Images obtained for documentation.  Guide wire advanced followed by a 5- French sheath.  A second 5-French C2 catheter was utilized to select the contralateral right internal iliac artery.  Selective right internal iliac angiogram performed.  Tortuous right uterine artery identified.  Micro catheterization performed of the right uterine artery as well.  Selective right uterine angiogram demonstrates wide patency of the right uterine artery with diffuse hypervascularity of the enlarged fibroid uterus.  Access was adequate for embolization.   For bilateral embolization, 1.5 vials of 500 - Embospheres and one half of a vialof 700-900 micron Embospheres were injected into each uterine artery.  Post embolization  angiogram confirms complete stasis of the bilateral uterine vascular territory.  Microcatheters and C2 catheters were removed.  Injection of the rightcommon femoral artery sheath confirms  access is adequate for Exoseal . Normal femoral anatomy.  Hemostasis was obtained with a 6-French the Exoseal device.  No immediate complication.  Peripheral pedal pulses are normal +2.  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  SFA remains patent proximally.  Internal iliac obturator collateral vessels are noted in the proximal thigh communicating with the deep femoral branches.  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 This correlates with the pretreatment MRI.  Negative for vascular complication, dissection, or occlusive process.  No significant stenosis.  Because of these findings, hemostasis was obtained with 15 minutes manual compression without difficulty.  Normal peripheral pedal pulses preserved.  The patient tolerated the procedure well.  No immediate complication.  IMPRESSION: Successful bilateral uterine artery embolization (U F E)   Original Report Authenticated By: Judie Petit. Ruel Favors, M.D.     Assessment/Plan: Symptomatic uterine fibroids S/p [place] 8/27 DC PCA Plan for DC later today if pt tol diet and voiding well.    LOS: 1 day    Brayton El PA-C 04/18/2012 9:13 AM

## 2012-04-18 NOTE — Progress Notes (Signed)
Attempting to void, unable to. Ambulated in hall with assistance. Using PCA for discomfort. Will cont to monitor.

## 2012-04-18 NOTE — Progress Notes (Signed)
Pt has been discharged home. Discharge instructions have been reviewed with patient and all questions have been answered. Pt refused wheelchair assistance to go downstairs and prefers to ambulate.

## 2012-04-18 NOTE — Discharge Summary (Signed)
Physician Discharge Summary  Patient ID: Erin Hall MRN: [id number] DOB/AGE: 11-12-1960 51 y.o.  Admit date: 04/17/2012 Discharge date: 04/18/2012  Admission Diagnoses: Principal Problem:  *Leiomyoma of body of uterus  Discharge Diagnoses:  Principal Problem:  *Leiomyoma of body of uterus    Procedures: Uterine artery embolization 8/27  Discharged Condition: good  Hospital Course: HPI: Erin Hall is an 51 y.o. female who was seen in consultation by Dr. Miles Costain for symptomatic uterine fibroids. After long discussion, they have agreed to schedule uterine fibroid embolization as treatment.  She was brought to the IR suite and underwent successful uterine artery embolization with good result. She had no immediate intra-op complications. She was admitted to the floor in stable condition. She did have some N/V that subsided once Dilaudid PCA was removed. She has been able to void on her own. Her pain is controlled with po meds. Dr. Lowella Dandy has seen the pt and feels she is stable for discharge today. We have reviewed all instructions and follow up plans as well as her discharge medications. We stressed the importance of taking the Ibuprofen as the majority of her discomfort will come from the post procedure inflammatory response. She is will follow up in approximately 3 weeks with Dr. Miles Costain.  Consults: None  Discharge Exam: Blood pressure 93/47, pulse 111, temperature 99.3 F (37.4 C), temperature source Oral, resp. rate 17, height 5\' 3"  (1.6 m), weight 173 lb 11.6 oz (78.8 kg), last menstrual period 03/17/2012, SpO2 97.00%. (B)femoral groin stick sites clean, no hematoma. Excellent pulses  Legs warm, normal distal cap refill.  Lungs: CTA  Heart: REg Abdomen: soft, ND, mildly tender suprapubic region.   Disposition:   Discharge to Home  Discharge Orders    Future Orders Please Complete By Expires   Diet general      Increase activity slowly      May shower / Bathe      Lifting  restrictions      Comments:   Avoid heavy lifting for 7-10 days.   Remove dressing in 24 hours      Call MD for:  temperature >100.4      Call MD for:  persistant nausea and vomiting      Call MD for:  severe uncontrolled pain      Call MD for:  redness, tenderness, or signs of infection (pain, swelling, redness, odor or green/yellow discharge around incision site)        Medication List  As of 04/18/2012 11:10 AM   TAKE these medications         celecoxib 200 MG capsule   Commonly known as: CELEBREX   Take 200 mg by mouth every evening.      cetirizine 10 MG tablet   Commonly known as: ZYRTEC   Take 10 mg by mouth every evening.      cholecalciferol 1000 UNITS tablet   Commonly known as: VITAMIN D   Take 1,000 Units by mouth every evening.      DSS 100 MG Caps   Take 100 mg by mouth 2 (two) times daily.      fish oil-omega-3 fatty acids 1000 MG capsule   Take 1 g by mouth every evening.      HYDROcodone-acetaminophen 5-325 MG per tablet   Commonly known as: NORCO/VICODIN   Take 1-2 tablets by mouth every 4 (four) hours as needed.      ibuprofen 200 MG tablet   Commonly known as: ADVIL,MOTRIN   Take 3  tablets (600 mg total) by mouth 3 (three) times daily.      levothyroxine 88 MCG tablet   Commonly known as: SYNTHROID, LEVOTHROID   Take 88 mcg by mouth daily before breakfast.      multivitamin with minerals Tabs   Take 1 tablet by mouth every evening.      promethazine 25 MG tablet   Commonly known as: PHENERGAN   Take 1 tablet (25 mg total) by mouth every 8 (eight) hours as needed for nausea.      simvastatin 40 MG tablet   Commonly known as: ZOCOR   Take 40 mg by mouth every evening.      vitamin C 500 MG tablet   Commonly known as: ASCORBIC ACID   Take 500 mg by mouth every evening.           Follow-up Information    Follow up with Sigurd Sos., MD. (Office will call with appointment date/time, about 3 weeks)    Contact information:   [facility]  Hospital-radiology [address], [address] [place] [address] [phone]          Signed: Brayton El PA-C 04/18/2012, 11:10 AM

## 2012-04-18 NOTE — Progress Notes (Signed)
Pt's bladder scanned, 185 ml noted. Pt was in and out cathed with 300 mls obtained.

## 2012-04-19 ENCOUNTER — Other Ambulatory Visit: Payer: Self-pay | Admitting: Interventional Radiology

## 2012-04-19 DIAGNOSIS — D259 Leiomyoma of uterus, unspecified: Secondary | ICD-10-CM

## 2012-04-20 ENCOUNTER — Telehealth: Payer: Self-pay | Admitting: Emergency Medicine

## 2012-04-20 NOTE — Telephone Encounter (Signed)
PT. CALLED W/ C/C OF BAD CRAMPS, BACK ACHE, HUGE AIR BUBBLE IN ABD REGION- SHE IS TAKING HER COLACE AND PASSING GAS.  SHE STARTED HER PERIOD.  TOLD PT THIS WAS NORMAL, BUT  WILL HAVE PA-C OR DR SHICK TO S/W PT.  PAGED, KEVIN ALLRED- 9:41AM KEVIN TO CALL PT.

## 2012-05-17 ENCOUNTER — Ambulatory Visit
Admission: RE | Admit: 2012-05-17 | Discharge: 2012-05-17 | Disposition: A | Discharge status: Home / Self Care | Payer: 59 | Source: Ambulatory Visit | Attending: Interventional Radiology | Admitting: Interventional Radiology

## 2012-05-17 DIAGNOSIS — D259 Leiomyoma of uterus, unspecified: Secondary | ICD-10-CM

## 2012-05-17 NOTE — Progress Notes (Signed)
Patient states that she is experiencing occasional cramping, and occasional spotting.    Afebrile.   No foul odor w/ discharge.    Energy improving.  Symptoms improving:  Decreased bloating.  Improvement re: urinary urgency, urinary frequency.

## 2012-07-25 ENCOUNTER — Telehealth: Payer: Self-pay | Admitting: Radiology

## 2012-07-25 NOTE — Telephone Encounter (Signed)
Left message:  Requested patient call with update.

## 2012-09-19 ENCOUNTER — Other Ambulatory Visit: Payer: Self-pay | Admitting: Interventional Radiology

## 2012-09-19 DIAGNOSIS — D219 Benign neoplasm of connective and other soft tissue, unspecified: Secondary | ICD-10-CM

## 2012-10-09 ENCOUNTER — Ambulatory Visit (HOSPITAL_COMMUNITY)
Admission: RE | Admit: 2012-10-09 | Discharge: 2012-10-09 | Disposition: A | Discharge status: Home / Self Care | Payer: 59 | Source: Ambulatory Visit | Attending: Interventional Radiology | Admitting: Interventional Radiology

## 2012-10-09 ENCOUNTER — Ambulatory Visit
Admission: RE | Admit: 2012-10-09 | Discharge: 2012-10-09 | Disposition: A | Discharge status: Home / Self Care | Payer: 59 | Source: Ambulatory Visit | Attending: Interventional Radiology | Admitting: Interventional Radiology

## 2012-10-09 DIAGNOSIS — D259 Leiomyoma of uterus, unspecified: Secondary | ICD-10-CM | POA: Insufficient documentation

## 2012-10-09 DIAGNOSIS — D219 Benign neoplasm of connective and other soft tissue, unspecified: Secondary | ICD-10-CM

## 2012-10-09 MED ORDER — GADOBENATE DIMEGLUMINE 529 MG/ML IV SOLN
15.0000 mL | Freq: Once | INTRAVENOUS | Status: AC | PRN
  Administered 2012-10-09: 14 mL via INTRAVENOUS

## 2012-10-30 ENCOUNTER — Other Ambulatory Visit (HOSPITAL_COMMUNITY): Payer: Self-pay | Admitting: Endocrinology

## 2012-10-30 DIAGNOSIS — E049 Nontoxic goiter, unspecified: Secondary | ICD-10-CM

## 2013-03-03 ENCOUNTER — Emergency Department (HOSPITAL_COMMUNITY)
Admission: EM | Admit: 2013-03-03 | Discharge: 2013-03-03 | Disposition: A | Discharge status: Home / Self Care | Payer: 59 | Source: Home / Self Care

## 2013-03-03 ENCOUNTER — Encounter (HOSPITAL_COMMUNITY): Payer: Self-pay | Admitting: *Deleted

## 2013-03-03 DIAGNOSIS — J309 Allergic rhinitis, unspecified: Secondary | ICD-10-CM

## 2013-03-03 DIAGNOSIS — J029 Acute pharyngitis, unspecified: Secondary | ICD-10-CM

## 2013-03-03 LAB — POCT RAPID STREP A: Streptococcus, Group A Screen (Direct): NEGATIVE

## 2013-03-03 MED ORDER — FLUTICASONE PROPIONATE 50 MCG/ACT NA SUSP: 2.0000 | Freq: Every day | NASAL | Status: DC

## 2013-03-03 MED ORDER — TRAMADOL HCL 50 MG PO TABS: 50.0000 mg | ORAL_TABLET | Freq: Four times a day (QID) | ORAL | Status: DC | PRN

## 2013-03-03 MED ORDER — METHYLPREDNISOLONE 4 MG PO KIT: PACK | ORAL | Status: DC

## 2013-03-03 NOTE — ED Notes (Signed)
Patient complains of sore throat x 3 days; some fever chills; sinus pressure and headache. States some nausea; denies vomiting.

## 2013-03-03 NOTE — ED Notes (Signed)
Throat Culture sent to lab with manual request.  

## 2013-03-03 NOTE — ED Provider Notes (Signed)
History    CSN: [id number] Arrival date & time 03/03/13  1114  First MD Initiated Contact with Patient 03/03/13 1134     Chief Complaint  Patient presents with  . Sore Throat   (Consider location/radiation/quality/duration/timing/severity/associated sxs/prior Treatment) HPI Comments: 52 year old female patient with a history of seasonal allergies takes Zyrtec on a regular basis. She presents today with complaints of sore throat and sinus pain primarily to the fore head and maxillary sinuses. He became worse approximately 4-5 days ago. She also has a runny nose and PND. Denies fevers.  Past Medical History  Diagnosis Date  . Hypothyroidism   . Hyperlipidemia   . History of seasonal allergies    Past Surgical History  Procedure Laterality Date  . Breast reduction surgery    . Knee surgery    . Uterine artery embolization  04/17/2012   No family history on file. History  Substance Use Topics  . Smoking status: Never Smoker   . Smokeless tobacco: Never Used  . Alcohol Use: No   OB History   Grav Para Term Preterm Abortions TAB SAB Ect Mult Living                 Review of Systems  Constitutional: Negative for fever, chills, activity change, appetite change and fatigue.  HENT: Positive for congestion, sore throat, rhinorrhea, postnasal drip and sinus pressure. Negative for facial swelling, neck pain and neck stiffness.   Eyes: Negative.   Respiratory: Negative.  Negative for chest tightness and shortness of breath.   Cardiovascular: Negative.   Gastrointestinal: Negative.   Skin: Negative for pallor and rash.  Neurological: Negative.     Allergies  Review of patient's allergies indicates no known allergies.  Home Medications   Current Outpatient Rx  Name  Route  Sig  Dispense  Refill  . celecoxib (CELEBREX) 200 MG capsule   Oral   Take 200 mg by mouth every evening.         . cetirizine (ZYRTEC) 10 MG tablet   Oral   Take 10 mg by mouth every evening.         . cholecalciferol (VITAMIN D) 1000 UNITS tablet   Oral   Take 1,000 Units by mouth every evening.         . fish oil-omega-3 fatty acids 1000 MG capsule   Oral   Take 1 g by mouth every evening.         Marland Kitchen levothyroxine (SYNTHROID, LEVOTHROID) 88 MCG tablet   Oral   Take 88 mcg by mouth daily before breakfast.         . vitamin C (ASCORBIC ACID) 500 MG tablet   Oral   Take 500 mg by mouth every evening.         . fluticasone (FLONASE) 50 MCG/ACT nasal spray   Nasal   Place 2 sprays into the nose daily.   16 g   2   . methylPREDNISolone (MEDROL DOSEPAK) 4 MG tablet      follow package directions   21 tablet   0   . Multiple Vitamin (MULTIVITAMIN WITH MINERALS) TABS   Oral   Take 1 tablet by mouth every evening.         Marland Kitchen EXPIRED: promethazine (PHENERGAN) 25 MG tablet   Oral   Take 1 tablet (25 mg total) by mouth every 8 (eight) hours as needed for nausea.   30 tablet      . simvastatin (ZOCOR) 40 MG tablet  Oral   Take 40 mg by mouth every evening.         . traMADol (ULTRAM) 50 MG tablet   Oral   Take 1 tablet (50 mg total) by mouth every 6 (six) hours as needed for pain.   15 tablet   0    BP 126/90  Pulse 82  Temp(Src) 98.7 F (37.1 C) (Oral)  Resp 20  SpO2 100%  LMP 03/01/2013 Physical Exam  Nursing note and vitals reviewed. Constitutional: She is oriented to person, place, and time. She appears well-developed and well-nourished. No distress.  HENT:  Bilateral TMs are normal. No retraction, erythema or bulging. Oropharynx with minor erythema, cobblestoning and clear PND. No exudates or tonsillar swelling.  Eyes: Conjunctivae and EOM are normal.  Neck: Normal range of motion. Neck supple.  Cardiovascular: Normal rate, regular rhythm and normal heart sounds.   Pulmonary/Chest: Effort normal and breath sounds normal. No respiratory distress. She has no wheezes. She has no rales.  Musculoskeletal: Normal range of motion. She exhibits  no edema.  Lymphadenopathy:    She has no cervical adenopathy.  Neurological: She is alert and oriented to person, place, and time.  Skin: Skin is warm and dry. No rash noted.  Psychiatric: She has a normal mood and affect.    ED Course  Procedures (including critical care time) Labs Reviewed  CULTURE, GROUP A STREP  POCT RAPID STREP A ([facility]RG CARE ONLY)   No results found. 1. Allergic rhinitis   2. Allergic sinusitis   3. Pharyngitis     MDM  Medrol dose pack Fluticasone NS Start saline nasal spray Continue Mucinex D Continue Zyrtec Tramadol for pain  Culture throat  Hayden Rasmussen, NP 03/03/13 1226

## 2013-03-04 NOTE — ED Provider Notes (Signed)
Medical screening examination/treatment/procedure(s) were performed by resident physician or non-physician practitioner and as supervising physician I was immediately available for consultation/collaboration.   Bettina Warn DOUGLAS MD.   Erin Therien D Adron Geisel, MD 03/04/13 1342 

## 2013-03-05 LAB — CULTURE, GROUP A STREP

## 2013-05-05 ENCOUNTER — Encounter (HOSPITAL_COMMUNITY): Payer: Self-pay | Admitting: Emergency Medicine

## 2013-05-05 ENCOUNTER — Emergency Department (HOSPITAL_COMMUNITY)
Admission: EM | Admit: 2013-05-05 | Discharge: 2013-05-05 | Disposition: A | Discharge status: Home / Self Care | Payer: 59 | Attending: Emergency Medicine | Admitting: Emergency Medicine

## 2013-05-05 DIAGNOSIS — E785 Hyperlipidemia, unspecified: Secondary | ICD-10-CM | POA: Insufficient documentation

## 2013-05-05 DIAGNOSIS — Y92410 Unspecified street and highway as the place of occurrence of the external cause: Secondary | ICD-10-CM | POA: Insufficient documentation

## 2013-05-05 DIAGNOSIS — S298XXA Other specified injuries of thorax, initial encounter: Secondary | ICD-10-CM | POA: Insufficient documentation

## 2013-05-05 DIAGNOSIS — Y9389 Activity, other specified: Secondary | ICD-10-CM | POA: Insufficient documentation

## 2013-05-05 DIAGNOSIS — S3981XA Other specified injuries of abdomen, initial encounter: Secondary | ICD-10-CM | POA: Insufficient documentation

## 2013-05-05 DIAGNOSIS — E039 Hypothyroidism, unspecified: Secondary | ICD-10-CM | POA: Insufficient documentation

## 2013-05-05 DIAGNOSIS — Z79899 Other long term (current) drug therapy: Secondary | ICD-10-CM | POA: Insufficient documentation

## 2013-05-05 MED ORDER — METHOCARBAMOL 500 MG PO TABS: 500.0000 mg | ORAL_TABLET | Freq: Two times a day (BID) | ORAL | Status: DC
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
## 2013-05-05 NOTE — ED Provider Notes (Signed)
Medical screening examination/treatment/procedure(s) were performed by non-physician practitioner and as supervising physician I was immediately available for consultation/collaboration.   Celene Kras, MD 05/05/13 [phone]
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
## 2013-07-22 ENCOUNTER — Ambulatory Visit (HOSPITAL_COMMUNITY): Payer: 59

## 2013-10-28 ENCOUNTER — Other Ambulatory Visit (HOSPITAL_COMMUNITY): Payer: Self-pay | Admitting: Endocrinology

## 2013-10-28 DIAGNOSIS — E049 Nontoxic goiter, unspecified: Secondary | ICD-10-CM

## 2013-11-29 ENCOUNTER — Other Ambulatory Visit: Payer: Self-pay | Admitting: Obstetrics and Gynecology

## 2013-11-29 ENCOUNTER — Encounter (HOSPITAL_COMMUNITY): Payer: Self-pay | Admitting: Pharmacist

## 2013-12-09 ENCOUNTER — Other Ambulatory Visit: Payer: Self-pay | Admitting: Obstetrics and Gynecology

## 2013-12-09 ENCOUNTER — Encounter (HOSPITAL_COMMUNITY): Payer: Self-pay

## 2013-12-09 ENCOUNTER — Encounter (HOSPITAL_COMMUNITY)
Admission: RE | Admit: 2013-12-09 | Discharge: 2013-12-09 | Disposition: A | Discharge status: Home / Self Care | Payer: 59 | Source: Ambulatory Visit | Attending: Obstetrics and Gynecology | Admitting: Obstetrics and Gynecology

## 2013-12-09 LAB — CBC
HCT: 41.2 % (ref 36.0–46.0)
HEMOGLOBIN: 14.4 g/dL (ref 12.0–15.0)
MCH: 29.8 pg (ref 26.0–34.0)
MCHC: 35 g/dL (ref 30.0–36.0)
MCV: 85.3 fL (ref 78.0–100.0)
Platelets: 258 10*3/uL (ref 150–400)
RBC: 4.83 MIL/uL (ref 3.87–5.11)
RDW: 13.1 % (ref 11.5–15.5)
WBC: 5.9 10*3/uL (ref 4.0–10.5)

## 2013-12-09 LAB — BASIC METABOLIC PANEL
BUN: 15 mg/dL (ref 6–23)
CHLORIDE: 101 meq/L (ref 96–112)
CO2: 28 mEq/L (ref 19–32)
Calcium: 9.5 mg/dL (ref 8.4–10.5)
Creatinine, Ser: 0.69 mg/dL (ref 0.50–1.10)
GFR calc Af Amer: >90 mL/min (ref >90)
GLUCOSE: 105 mg/dL — AB (ref 70–99)
POTASSIUM: 4.1 meq/L (ref 3.7–5.3)
SODIUM: 140 meq/L (ref 137–147)

## 2013-12-09 NOTE — Patient Instructions (Signed)
[place]  12/09/2013   Your procedure is scheduled on:  12/12/13  Enter through the Main Entrance of [facility] [place] up the phone at the desk and dial [phone].   Call this number if you have problems the morning of surgery: [phone]   Remember:   Do not eat food:After Midnight.  Do not drink clear liquids: After Midnight.  Take these medicines the morning of surgery with A SIP OF WATER: Synthroid   Do not wear jewelry, make-up or nail polish.  Do not wear lotions, powders, or perfumes. You may wear deodorant.  Do not shave 48 hours prior to surgery.  Do not bring valuables to the hospital.  [facility] is not   responsible for any belongings or valuables brought to the hospital.  Contacts, dentures or bridgework may not be worn into surgery.  Leave suitcase in the car. After surgery it may be brought to your room.  For patients admitted to the hospital, checkout time is 11:00 AM the day of              discharge.   Patients discharged the day of surgery will not be allowed to drive             home.  Name and phone number of your driver: NA  Special Instructions:      Please read over the following fact sheets that you were given:   Surgical Site Infection Prevention

## 2013-12-12 ENCOUNTER — Encounter (HOSPITAL_COMMUNITY): Payer: Self-pay

## 2013-12-12 ENCOUNTER — Ambulatory Visit (HOSPITAL_COMMUNITY): Payer: 59 | Admitting: Anesthesiology

## 2013-12-12 ENCOUNTER — Encounter (HOSPITAL_COMMUNITY): Payer: 59 | Admitting: Anesthesiology

## 2013-12-12 ENCOUNTER — Ambulatory Visit (HOSPITAL_COMMUNITY)
Admission: RE | Admit: 2013-12-12 | Discharge: 2013-12-13 | Disposition: A | Discharge status: Home / Self Care | Payer: 59 | Source: Ambulatory Visit | Attending: Obstetrics and Gynecology | Admitting: Obstetrics and Gynecology

## 2013-12-12 ENCOUNTER — Encounter (HOSPITAL_COMMUNITY)
Admission: RE | Disposition: A | Discharge status: Home / Self Care | Payer: Self-pay | Source: Ambulatory Visit | Attending: Obstetrics and Gynecology

## 2013-12-12 DIAGNOSIS — N949 Unspecified condition associated with female genital organs and menstrual cycle: Secondary | ICD-10-CM | POA: Insufficient documentation

## 2013-12-12 DIAGNOSIS — D250 Submucous leiomyoma of uterus: Secondary | ICD-10-CM | POA: Insufficient documentation

## 2013-12-12 DIAGNOSIS — N8000 Endometriosis of the uterus, unspecified: Secondary | ICD-10-CM | POA: Insufficient documentation

## 2013-12-12 DIAGNOSIS — Z90710 Acquired absence of both cervix and uterus: Secondary | ICD-10-CM | POA: Diagnosis present

## 2013-12-12 DIAGNOSIS — D252 Subserosal leiomyoma of uterus: Secondary | ICD-10-CM | POA: Insufficient documentation

## 2013-12-12 DIAGNOSIS — D251 Intramural leiomyoma of uterus: Secondary | ICD-10-CM | POA: Insufficient documentation

## 2013-12-12 HISTORY — PX: ROBOTIC ASSISTED TOTAL HYSTERECTOMY: SHX6085

## 2013-12-12 HISTORY — PX: BILATERAL SALPINGECTOMY: SHX5743

## 2013-12-12 SURGERY — ROBOTIC ASSISTED TOTAL HYSTERECTOMY
Anesthesia: General | Site: Abdomen

## 2013-12-12 MED ORDER — KETOROLAC TROMETHAMINE 30 MG/ML IJ SOLN
30.0000 mg | Freq: Four times a day (QID) | INTRAMUSCULAR | Status: DC
  Administered 2013-12-12: 30 mg via INTRAVENOUS
  Filled 2013-12-12: qty 1

## 2013-12-12 MED ORDER — METHYLENE BLUE 1 % INJ SOLN
INTRAMUSCULAR | Status: AC
  Filled 2013-12-12: qty 10

## 2013-12-12 MED ORDER — CEFAZOLIN SODIUM-DEXTROSE 2-3 GM-% IV SOLR
INTRAVENOUS | Status: AC
  Filled 2013-12-12: qty 50

## 2013-12-12 MED ORDER — HYDROMORPHONE HCL PF 1 MG/ML IJ SOLN
INTRAMUSCULAR | Status: DC | PRN
Start: 2013-12-12 — End: 2013-12-12
  Administered 2013-12-12: 1 mg via INTRAVENOUS

## 2013-12-12 MED ORDER — DEXAMETHASONE SODIUM PHOSPHATE 10 MG/ML IJ SOLN
INTRAMUSCULAR | Status: AC
  Filled 2013-12-12: qty 1

## 2013-12-12 MED ORDER — ARTIFICIAL TEARS OP OINT
TOPICAL_OINTMENT | OPHTHALMIC | Status: AC
  Filled 2013-12-12: qty 3.5

## 2013-12-12 MED ORDER — LACTATED RINGERS IV SOLN
INTRAVENOUS | Status: DC
  Administered 2013-12-12: 09:00:00 via INTRAVENOUS

## 2013-12-12 MED ORDER — HYDROMORPHONE HCL PF 1 MG/ML IJ SOLN
INTRAMUSCULAR | Status: AC
  Filled 2013-12-12: qty 1

## 2013-12-12 MED ORDER — PROPOFOL 10 MG/ML IV EMUL
INTRAVENOUS | Status: AC
  Filled 2013-12-12: qty 20

## 2013-12-12 MED ORDER — NEOSTIGMINE METHYLSULFATE 1 MG/ML IJ SOLN
INTRAMUSCULAR | Status: DC | PRN
  Administered 2013-12-12: 3 mg via INTRAVENOUS

## 2013-12-12 MED ORDER — SODIUM CHLORIDE 0.9 % IJ SOLN
INTRAMUSCULAR | Status: AC
  Filled 2013-12-12: qty 50

## 2013-12-12 MED ORDER — LACTATED RINGERS IV SOLN
INTRAVENOUS | Status: DC | PRN
  Administered 2013-12-12 (×2): via INTRAVENOUS

## 2013-12-12 MED ORDER — PROPOFOL 10 MG/ML IV BOLUS
INTRAVENOUS | Status: DC | PRN
  Administered 2013-12-12: 50 mg via INTRAVENOUS
  Administered 2013-12-12: 200 mg via INTRAVENOUS
  Administered 2013-12-12: 50 mg via INTRAVENOUS

## 2013-12-12 MED ORDER — LIDOCAINE HCL (CARDIAC) 20 MG/ML IV SOLN
INTRAVENOUS | Status: AC
  Filled 2013-12-12: qty 5

## 2013-12-12 MED ORDER — ONDANSETRON HCL 4 MG PO TABS: 4.0000 mg | ORAL_TABLET | Freq: Four times a day (QID) | ORAL | Status: DC | PRN

## 2013-12-12 MED ORDER — ROCURONIUM BROMIDE 100 MG/10ML IV SOLN
INTRAVENOUS | Status: AC
  Filled 2013-12-12: qty 1

## 2013-12-12 MED ORDER — MENTHOL 3 MG MT LOZG: 1.0000 | LOZENGE | OROMUCOSAL | Status: DC | PRN

## 2013-12-12 MED ORDER — CEFAZOLIN SODIUM-DEXTROSE 2-3 GM-% IV SOLR
2.0000 g | INTRAVENOUS | Status: AC
  Administered 2013-12-12: 2 g via INTRAVENOUS

## 2013-12-12 MED ORDER — ONDANSETRON HCL 4 MG/2ML IJ SOLN
INTRAMUSCULAR | Status: DC | PRN
  Administered 2013-12-12: 4 mg via INTRAVENOUS

## 2013-12-12 MED ORDER — FENTANYL CITRATE 0.05 MG/ML IJ SOLN
INTRAMUSCULAR | Status: DC | PRN
  Administered 2013-12-12: 50 ug via INTRAVENOUS
  Administered 2013-12-12 (×2): 100 ug via INTRAVENOUS

## 2013-12-12 MED ORDER — PANTOPRAZOLE SODIUM 40 MG PO TBEC
40.0000 mg | DELAYED_RELEASE_TABLET | Freq: Every day | ORAL | Status: DC
  Administered 2013-12-12 – 2013-12-13 (×2): 40 mg via ORAL
  Filled 2013-12-12 (×2): qty 1

## 2013-12-12 MED ORDER — LEVOTHYROXINE SODIUM 88 MCG PO TABS
88.0000 ug | ORAL_TABLET | Freq: Every day | ORAL | Status: DC
  Administered 2013-12-13: 88 ug via ORAL
  Filled 2013-12-12: qty 1

## 2013-12-12 MED ORDER — SODIUM CHLORIDE 0.9 % IJ SOLN
INTRAMUSCULAR | Status: AC
Start: 2013-12-12 — End: 2013-12-12
  Filled 2013-12-12: qty 10

## 2013-12-12 MED ORDER — ROPIVACAINE HCL 5 MG/ML IJ SOLN
INTRAMUSCULAR | Status: DC | PRN
  Administered 2013-12-12: 60 mL

## 2013-12-12 MED ORDER — HYDROMORPHONE HCL PF 1 MG/ML IJ SOLN
0.2000 mg | INTRAMUSCULAR | Status: DC | PRN
  Administered 2013-12-12: 0.6 mg via INTRAVENOUS
  Filled 2013-12-12: qty 1

## 2013-12-12 MED ORDER — KETOROLAC TROMETHAMINE 30 MG/ML IJ SOLN: 30.0000 mg | Freq: Four times a day (QID) | INTRAMUSCULAR | Status: DC

## 2013-12-12 MED ORDER — ARTIFICIAL TEARS OP OINT
TOPICAL_OINTMENT | OPHTHALMIC | Status: DC | PRN
  Administered 2013-12-12: 1 via OPHTHALMIC

## 2013-12-12 MED ORDER — ONDANSETRON HCL 4 MG/2ML IJ SOLN: 4.0000 mg | Freq: Four times a day (QID) | INTRAMUSCULAR | Status: DC | PRN

## 2013-12-12 MED ORDER — SODIUM CHLORIDE 0.9 % IJ SOLN
INTRAMUSCULAR | Status: AC
  Filled 2013-12-12: qty 10

## 2013-12-12 MED ORDER — DEXTROSE IN LACTATED RINGERS 5 % IV SOLN
INTRAVENOUS | Status: DC
  Administered 2013-12-12: 20:00:00 via INTRAVENOUS

## 2013-12-12 MED ORDER — BUPIVACAINE HCL (PF) 0.25 % IJ SOLN
INTRAMUSCULAR | Status: AC
  Filled 2013-12-12: qty 30

## 2013-12-12 MED ORDER — FENTANYL CITRATE 0.05 MG/ML IJ SOLN: 25.0000 ug | INTRAMUSCULAR | Status: DC | PRN

## 2013-12-12 MED ORDER — DEXAMETHASONE SODIUM PHOSPHATE 10 MG/ML IJ SOLN
INTRAMUSCULAR | Status: DC | PRN
  Administered 2013-12-12: 5 mg via INTRAVENOUS

## 2013-12-12 MED ORDER — GLYCOPYRROLATE 0.2 MG/ML IJ SOLN
INTRAMUSCULAR | Status: DC | PRN
  Administered 2013-12-12: .4 mg via INTRAVENOUS

## 2013-12-12 MED ORDER — OXYCODONE-ACETAMINOPHEN 5-325 MG PO TABS
1.0000 | ORAL_TABLET | ORAL | Status: DC | PRN
  Administered 2013-12-13 (×2): 2 via ORAL
  Filled 2013-12-12 (×2): qty 2

## 2013-12-12 MED ORDER — MIDAZOLAM HCL 2 MG/2ML IJ SOLN
INTRAMUSCULAR | Status: AC
  Filled 2013-12-12: qty 2

## 2013-12-12 MED ORDER — MIDAZOLAM HCL 2 MG/2ML IJ SOLN: 0.5000 mg | Freq: Once | INTRAMUSCULAR | Status: DC | PRN

## 2013-12-12 MED ORDER — ROCURONIUM BROMIDE 100 MG/10ML IV SOLN
INTRAVENOUS | Status: DC | PRN
  Administered 2013-12-12: 50 mg via INTRAVENOUS
  Administered 2013-12-12 (×3): 10 mg via INTRAVENOUS
  Administered 2013-12-12: 20 mg via INTRAVENOUS

## 2013-12-12 MED ORDER — LIDOCAINE HCL (CARDIAC) 20 MG/ML IV SOLN
INTRAVENOUS | Status: DC | PRN
  Administered 2013-12-12: 80 mg via INTRAVENOUS

## 2013-12-12 MED ORDER — BUPIVACAINE HCL (PF) 0.25 % IJ SOLN
INTRAMUSCULAR | Status: DC | PRN
  Administered 2013-12-12: 8 mL

## 2013-12-12 MED ORDER — KETOROLAC TROMETHAMINE 30 MG/ML IJ SOLN: 15.0000 mg | Freq: Once | INTRAMUSCULAR | Status: DC | PRN

## 2013-12-12 MED ORDER — FENTANYL CITRATE 0.05 MG/ML IJ SOLN
INTRAMUSCULAR | Status: AC
  Filled 2013-12-12: qty 5

## 2013-12-12 MED ORDER — SODIUM CHLORIDE 0.9 % IJ SOLN
INTRAMUSCULAR | Status: DC | PRN
  Administered 2013-12-12: 60 mL via INTRAVENOUS

## 2013-12-12 MED ORDER — GLYCOPYRROLATE 0.2 MG/ML IJ SOLN
INTRAMUSCULAR | Status: AC
  Filled 2013-12-12: qty 2

## 2013-12-12 MED ORDER — MIDAZOLAM HCL 2 MG/2ML IJ SOLN
INTRAMUSCULAR | Status: DC | PRN
  Administered 2013-12-12: 2 mg via INTRAVENOUS

## 2013-12-12 MED ORDER — MEPERIDINE HCL 25 MG/ML IJ SOLN: 6.2500 mg | INTRAMUSCULAR | Status: DC | PRN

## 2013-12-12 MED ORDER — ESTRADIOL 0.1 MG/GM VA CREA
TOPICAL_CREAM | VAGINAL | Status: AC
  Filled 2013-12-12: qty 42.5

## 2013-12-12 MED ORDER — LACTATED RINGERS IR SOLN
Status: DC | PRN
  Administered 2013-12-12: 3000 mL

## 2013-12-12 MED ORDER — NEOSTIGMINE METHYLSULFATE 1 MG/ML IJ SOLN
INTRAMUSCULAR | Status: AC
  Filled 2013-12-12: qty 1

## 2013-12-12 MED ORDER — ROPIVACAINE HCL 5 MG/ML IJ SOLN
INTRAMUSCULAR | Status: AC
  Filled 2013-12-12: qty 60

## 2013-12-12 MED ORDER — PROMETHAZINE HCL 25 MG/ML IJ SOLN: 6.2500 mg | INTRAMUSCULAR | Status: DC | PRN

## 2013-12-12 MED ORDER — IBUPROFEN 800 MG PO TABS
800.0000 mg | ORAL_TABLET | Freq: Three times a day (TID) | ORAL | Status: DC | PRN
Start: 2013-12-12 — End: 2013-12-13

## 2013-12-12 MED ORDER — ONDANSETRON HCL 4 MG/2ML IJ SOLN
INTRAMUSCULAR | Status: AC
  Filled 2013-12-12: qty 2

## 2013-12-12 MED ORDER — KETOROLAC TROMETHAMINE 30 MG/ML IJ SOLN
INTRAMUSCULAR | Status: DC | PRN
  Administered 2013-12-12: 30 mg via INTRAMUSCULAR
  Administered 2013-12-12: 30 mg via INTRAVENOUS

## 2013-12-12 SURGICAL SUPPLY — 87 items
ADH SKN CLS APL DERMABOND .7 (GAUZE/BANDAGES/DRESSINGS) ×2
APPLICATOR COTTON TIP 6IN STRL (MISCELLANEOUS) ×3 IMPLANT
BAG URINE DRAINAGE (UROLOGICAL SUPPLIES) ×3 IMPLANT
BARRIER ADHS 3X4 INTERCEED (GAUZE/BANDAGES/DRESSINGS) ×3 IMPLANT
BRR ADH 4X3 ABS CNTRL BYND (GAUZE/BANDAGES/DRESSINGS) ×2
CABLE HIGH FREQUENCY MONO STRZ (ELECTRODE) IMPLANT
CATH FOLEY 3WAY  5CC 16FR (CATHETERS) ×1
CATH FOLEY 3WAY 5CC 16FR (CATHETERS) ×2 IMPLANT
CATH ROBINSON RED A/P 16FR (CATHETERS) IMPLANT
CHLORAPREP W/TINT 26ML (MISCELLANEOUS) ×3 IMPLANT
CLOTH BEACON ORANGE TIMEOUT ST (SAFETY) ×3 IMPLANT
CONT PATH 16OZ SNAP LID 3702 (MISCELLANEOUS) ×3 IMPLANT
COVER MAYO STAND STRL (DRAPES) ×3 IMPLANT
COVER TABLE BACK 60X90 (DRAPES) ×6 IMPLANT
COVER TIP SHEARS 8 DVNC (MISCELLANEOUS) ×2 IMPLANT
COVER TIP SHEARS 8MM DA VINCI (MISCELLANEOUS) ×1
DECANTER SPIKE VIAL GLASS SM (MISCELLANEOUS) ×3 IMPLANT
DERMABOND ADVANCED (GAUZE/BANDAGES/DRESSINGS) ×1
DERMABOND ADVANCED .7 DNX12 (GAUZE/BANDAGES/DRESSINGS) ×2 IMPLANT
DEVICE TROCAR PUNCTURE CLOSURE (ENDOMECHANICALS) IMPLANT
DRAPE HUG U DISPOSABLE (DRAPE) ×3 IMPLANT
DRAPE LG THREE QUARTER DISP (DRAPES) ×6 IMPLANT
DRAPE WARM FLUID 44X44 (DRAPE) ×3 IMPLANT
ELECT LIGASURE LONG (ELECTRODE) ×3 IMPLANT
ELECT REM PT RETURN 9FT ADLT (ELECTROSURGICAL) ×3
ELECTRODE REM PT RTRN 9FT ADLT (ELECTROSURGICAL) ×2 IMPLANT
EVACUATOR SMOKE 8.L (FILTER) ×3 IMPLANT
FORCEPS CUTTING 33CM 5MM (CUTTING FORCEPS) IMPLANT
GAUZE PACKING 1 X5 YD ST (GAUZE/BANDAGES/DRESSINGS) IMPLANT
GAUZE VASELINE 3X9 (GAUZE/BANDAGES/DRESSINGS) IMPLANT
GLOVE BIOGEL PI IND STRL 6.5 (GLOVE) ×2 IMPLANT
GLOVE BIOGEL PI IND STRL 7.0 (GLOVE) ×4 IMPLANT
GLOVE BIOGEL PI INDICATOR 6.5 (GLOVE) ×1
GLOVE BIOGEL PI INDICATOR 7.0 (GLOVE) ×2
GLOVE ECLIPSE 6.5 STRL STRAW (GLOVE) ×3 IMPLANT
GOWN STRL REUS W/TWL LRG LVL3 (GOWN DISPOSABLE) ×18 IMPLANT
GYRUS RUMI II 2.5CM BLUE (DISPOSABLE) ×3
KIT ACCESSORY DA VINCI DISP (KITS) ×1
KIT ACCESSORY DVNC DISP (KITS) ×2 IMPLANT
LEGGING LITHOTOMY PAIR STRL (DRAPES) ×3 IMPLANT
NEEDLE INSUFFLATION 120MM (ENDOMECHANICALS) ×3 IMPLANT
NEEDLE INSUFFLATION 150MM (ENDOMECHANICALS) ×3 IMPLANT
NS IRRIG 1000ML POUR BTL (IV SOLUTION) ×3 IMPLANT
OCCLUDER COLPOPNEUMO (BALLOONS) ×2 IMPLANT
PACK LAVH (CUSTOM PROCEDURE TRAY) ×3 IMPLANT
PAD PREP 24X48 CUFFED NSTRL (MISCELLANEOUS) ×6 IMPLANT
PLUG CATH AND CAP STER (CATHETERS) ×3 IMPLANT
PROTECTOR NERVE ULNAR (MISCELLANEOUS) ×6 IMPLANT
RUMI II GYRUS 2.5CM BLUE (DISPOSABLE) ×1 IMPLANT
SCISSORS LAP 5X35 DISP (ENDOMECHANICALS) IMPLANT
SET CYSTO W/LG BORE CLAMP LF (SET/KITS/TRAYS/PACK) IMPLANT
SET IRRIG TUBING LAPAROSCOPIC (IRRIGATION / IRRIGATOR) ×3 IMPLANT
SOLUTION ELECTROLUBE (MISCELLANEOUS) ×3 IMPLANT
SUT VIC AB 0 CT1 18XCR BRD8 (SUTURE) ×4 IMPLANT
SUT VIC AB 0 CT1 27 (SUTURE) ×6
SUT VIC AB 0 CT1 27XBRD ANBCTR (SUTURE) ×2 IMPLANT
SUT VIC AB 0 CT1 36 (SUTURE) ×11 IMPLANT
SUT VIC AB 0 CT1 8-18 (SUTURE) ×6
SUT VIC AB 3-0 SH 27 (SUTURE)
SUT VIC AB 3-0 SH 27X BRD (SUTURE) IMPLANT
SUT VICRYL 0 TIES 12 18 (SUTURE) ×3 IMPLANT
SUT VICRYL 0 UR6 27IN ABS (SUTURE) ×3 IMPLANT
SUT VICRYL 4-0 PS2 18IN ABS (SUTURE) ×6 IMPLANT
SUT VLOC 180 0 6IN GS21 (SUTURE) ×2 IMPLANT
SUT VLOC 180 0 9IN  GS21 (SUTURE) ×2
SUT VLOC 180 0 9IN GS21 (SUTURE) ×3 IMPLANT
SYR 50ML LL SCALE MARK (SYRINGE) ×3 IMPLANT
SYRINGE 10CC LL (SYRINGE) ×3 IMPLANT
TIP RUMI ORANGE 6.7MMX12CM (TIP) IMPLANT
TIP UTERINE 5.1X6CM LAV DISP (MISCELLANEOUS) IMPLANT
TIP UTERINE 6.7X10CM GRN DISP (MISCELLANEOUS) ×2 IMPLANT
TIP UTERINE 6.7X6CM WHT DISP (MISCELLANEOUS) IMPLANT
TIP UTERINE 6.7X8CM BLUE DISP (MISCELLANEOUS) ×2 IMPLANT
TOWEL OR 17X24 6PK STRL BLUE (TOWEL DISPOSABLE) ×9 IMPLANT
TRAY FOLEY CATH 14FR (SET/KITS/TRAYS/PACK) ×3 IMPLANT
TROCAR 12M 150ML BLUNT (TROCAR) IMPLANT
TROCAR DISP BLADELESS 8 DVNC (TROCAR) IMPLANT
TROCAR DISP BLADELESS 8MM (TROCAR)
TROCAR OPTI BLUNT TIP 12M 100M (ENDOMECHANICALS) ×2 IMPLANT
TROCAR OPTI TIP 5M 100M (ENDOMECHANICALS) ×6 IMPLANT
TROCAR XCEL 12X100 BLDLESS (ENDOMECHANICALS) ×3 IMPLANT
TROCAR XCEL DIL TIP R 11M (ENDOMECHANICALS) ×3 IMPLANT
TROCAR XCEL NON-BLD 5MMX100MML (ENDOMECHANICALS) ×6 IMPLANT
TROCAR Z-THREAD 12X150 (TROCAR) ×3 IMPLANT
TUBING FILTER THERMOFLATOR (ELECTROSURGICAL) ×3 IMPLANT
WARMER LAPAROSCOPE (MISCELLANEOUS) ×3 IMPLANT
WATER STERILE IRR 1000ML POUR (IV SOLUTION) ×9 IMPLANT

## 2013-12-12 NOTE — Transfer of Care (Signed)
Immediate Anesthesia Transfer of Care Note  Patient: Erin Hall  Procedure(s) Performed: Procedure(s): POSSIBLE ROBOTIC ASSISTED TOTAL HYSTERECTOMY WITH BILATERAL SALPINGECTOMY (N/A)  Patient Location: PACU  Anesthesia Type:General  Level of Consciousness: awake, alert  and oriented  Airway & Oxygen Therapy: Patient Spontanous Breathing and Patient connected to nasal cannula oxygen  Post-op Assessment: Report given to PACU RN and Post -op Vital signs reviewed and stable  Post vital signs: Reviewed and stable  Complications: No apparent anesthesia complications

## 2013-12-12 NOTE — Brief Op Note (Signed)
12/12/2013  2:12 PM  PATIENT:  Erin Hall  53 y.o. female  PRE-OPERATIVE DIAGNOSIS:  Symptomatic Uterine Fibroid  POST-OPERATIVE DIAGNOSIS:  Symptomatic Uterine Fibroid  PROCEDURE:  Procedure(s): ROBOTIC ASSISTED TOTAL HYSTERECTOMY (N/A) BILATERAL SALPINGECTOMY (N/A)  SURGEON:  Surgeon(s) and Role:    * Fedrick Cefalu Clint Bolder, MD - Primary    * Elveria Royals, MD - Assisting  PHYSICIAN ASSISTANT:   ASSISTANTS:  Azucena Fallen, MD   ANESTHESIA:   general Findings: large fibroid uterus, nl tubes and ovaries, some pelvic adhesions EBL:  Total I/O In: 2000 [I.V.:2000] Out: 300 [Urine:225; Blood:75]  BLOOD ADMINISTERED:none  DRAINS: none   LOCAL MEDICATIONS USED:  BUPIVICAINE   SPECIMEN:  Source of Specimen:  uterus w/ cervix, tubes  DISPOSITION OF SPECIMEN:  PATHOLOGY  COUNTS:  YES  TOURNIQUET:  * No tourniquets in log *  DICTATION: .Other Dictation: Dictation Number [id number]  PLAN OF CARE: Admit for overnight observation  PATIENT DISPOSITION:  PACU - hemodynamically stable.   Delay start of Pharmacological VTE agent (>24hrs) due to surgical blood loss or risk of bleeding: no

## 2013-12-12 NOTE — Progress Notes (Signed)
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  She welcomed visit, noting that she is doing well after surgery, has good support from family, and is optimistic that she will need only three weeks for recovery.  She is in good spirits and is aware of ongoing chaplain availability.   [place], [place]

## 2013-12-12 NOTE — Anesthesia Preprocedure Evaluation (Signed)
Anesthesia Evaluation  Patient identified by MRN, date of birth, ID band Patient awake    Reviewed: Allergy & Precautions, H&P , Patient's Chart, lab work & pertinent test results, reviewed documented beta blocker date and time   History of Anesthesia Complications Negative for: history of anesthetic complications  Airway Mallampati: II TM Distance: >3 FB Neck ROM: full    Dental   Pulmonary  breath sounds clear to auscultation        Cardiovascular Exercise Tolerance: Good Rhythm:regular Rate:Normal     Neuro/Psych    GI/Hepatic   Endo/Other  Hypothyroidism   Renal/GU      Musculoskeletal   Abdominal   Peds  Hematology   Anesthesia Other Findings   Reproductive/Obstetrics                           Anesthesia Physical Anesthesia Plan  ASA: II  Anesthesia Plan: General ETT   Post-op Pain Management:    Induction:   Airway Management Planned:   Additional Equipment:   Intra-op Plan:   Post-operative Plan:   Informed Consent: I have reviewed the patients History and Physical, chart, labs and discussed the procedure including the risks, benefits and alternatives for the proposed anesthesia with the patient or authorized representative who has indicated his/her understanding and acceptance.   Dental Advisory Given  Plan Discussed with: CRNA and Surgeon  Anesthesia Plan Comments:         Anesthesia Quick Evaluation

## 2013-12-12 NOTE — Anesthesia Postprocedure Evaluation (Signed)
Anesthesia Post Note  Patient: Erin Hall  Procedure(s) Performed: Procedure(s) (LRB): ROBOTIC ASSISTED TOTAL HYSTERECTOMY (N/A) BILATERAL SALPINGECTOMY (N/A)  Anesthesia type: General  Patient location: PACU  Post pain: Pain level controlled  Post assessment: Post-op Vital signs reviewed  Last Vitals:  Filed Vitals:   12/12/13 1415  BP: 120/69  Pulse: 86  Temp:   Resp: 17    Post vital signs: Reviewed  Level of consciousness: sedated  Complications: No apparent anesthesia complications

## 2013-12-12 NOTE — Addendum Note (Signed)
Addendum created 12/12/13 1423 by Lyn Hollingshead, MD   Modules edited: Notes Section   Notes Section:  File: [id number]; [place]: [phone]

## 2013-12-12 NOTE — Progress Notes (Signed)
Pt. Stated that " I am wet." . Pt. Underwear was saturated. Foley catheter is draining clear amber urine. Three way stop Roni Bread was also drain amber colored urine. Dr Garwin Brothers notified of same. Foley catheter was discontinued. As ordered.

## 2013-12-12 NOTE — Anesthesia Postprocedure Evaluation (Deleted)
Anesthesia Post Note  Patient: Erin Hall  Procedure(s) Performed: Procedure(s) (LRB): ROBOTIC ASSISTED TOTAL HYSTERECTOMY (N/A) BILATERAL SALPINGECTOMY (N/A)  Anesthesia type: Spinal  Patient location: PACU  Post pain: Pain level controlled  Post assessment: Post-op Vital signs reviewed  Last Vitals:  Filed Vitals:   12/12/13 1415  BP: 120/69  Pulse: 86  Temp:   Resp: 17    Post vital signs: Reviewed  Level of consciousness: awake  Complications: No apparent anesthesia complications

## 2013-12-13 ENCOUNTER — Encounter (HOSPITAL_COMMUNITY): Payer: Self-pay | Admitting: Obstetrics and Gynecology

## 2013-12-13 LAB — CBC
HEMATOCRIT: 37.6 % (ref 36.0–46.0)
HEMOGLOBIN: 12.7 g/dL (ref 12.0–15.0)
MCH: 29 pg (ref 26.0–34.0)
MCHC: 33.8 g/dL (ref 30.0–36.0)
MCV: 85.8 fL (ref 78.0–100.0)
Platelets: 228 10*3/uL (ref 150–400)
RBC: 4.38 MIL/uL (ref 3.87–5.11)
RDW: 13.1 % (ref 11.5–15.5)
WBC: 10 10*3/uL (ref 4.0–10.5)

## 2013-12-13 LAB — BASIC METABOLIC PANEL
BUN: 11 mg/dL (ref 6–23)
CO2: 26 mEq/L (ref 19–32)
Calcium: 8.7 mg/dL (ref 8.4–10.5)
Chloride: 104 mEq/L (ref 96–112)
Creatinine, Ser: 0.73 mg/dL (ref 0.50–1.10)
Glucose, Bld: 102 mg/dL — ABNORMAL HIGH (ref 70–99)
POTASSIUM: 4.7 meq/L (ref 3.7–5.3)
SODIUM: 140 meq/L (ref 137–147)

## 2013-12-13 MED ORDER — IBUPROFEN 800 MG PO TABS: 800.0000 mg | ORAL_TABLET | Freq: Three times a day (TID) | ORAL | Status: DC | PRN

## 2013-12-13 MED ORDER — OXYCODONE-ACETAMINOPHEN 5-325 MG PO TABS: 1.0000 | ORAL_TABLET | ORAL | Status: DC | PRN

## 2013-12-13 NOTE — Anesthesia Postprocedure Evaluation (Signed)
  Anesthesia Post-op Note  Patient: Erin Hall  Procedure(s) Performed: Procedure(s): ROBOTIC ASSISTED TOTAL HYSTERECTOMY (N/A) BILATERAL SALPINGECTOMY (N/A)  Patient Location: Women's Unit  Anesthesia Type:General  Level of Consciousness: awake  Airway and Oxygen Therapy: Patient Spontanous Breathing  Post-op Pain: none  Post-op Assessment: Patient's Cardiovascular Status Stable, Respiratory Function Stable, Patent Airway, No signs of Nausea or vomiting, Adequate PO intake and Pain level controlled  Post-op Vital Signs: Reviewed and stable  Last Vitals:  Filed Vitals:   12/13/13 0559  BP: 107/69  Pulse: 88  Temp: 36.8 C  Resp: 18    Complications: No apparent anesthesia complications

## 2013-12-13 NOTE — Discharge Instructions (Signed)
Call if temperature greater than equal to 100.4, nothing per vagina for 4-6 weeks or severe nausea vomiting, increased incisional pain , drainage or redness in the incision site, no straining with bowel movements, showers no bath °

## 2013-12-13 NOTE — Op Note (Signed)
NAMETARON, CONREY                ACCOUNT NO.:  [id number]  MEDICAL RECORD NO.:  [id number]  LOCATION:  2440                          FACILITY:  [facility]  PHYSICIAN:  Servando Salina, M.D.DATE OF BIRTH:  1961-06-03  DATE OF PROCEDURE:  12/12/2013 DATE OF DISCHARGE:                              OPERATIVE REPORT   PREOPERATIVE DIAGNOSES:  Fibroid uterus, pelvic pressure, and pelvic pain.  PROCEDURE:  Da Vinci robotic total hysterectomy, bilateral salpingectomy.  POSTOPERATIVE DIAGNOSES:  Multiple subserosal, intramural, and submucosal fibroids; pelvic pressure, and pelvic pain.  ANESTHESIA:  General.  SURGEON:  Servando Salina, MD.  ASSISTANTVilma Prader, MD  DESCRIPTION OF PROCEDURE:  Under adequate general anesthesia, the patient was placed in the dorsal lithotomy position.  She was positioned for robotic surgery.  The patient was initially consented for laparoscopic-assisted vaginal hysterectomy.  The patient is a nulligravid.  On examination under anesthesia, uterus is about 10-12 week size, markedly irregular with a palpable right posterior fibroid. Vagina was narrow with an elongated cervix.  Based on the lack of descent and the narrowness of the vagina, decision was made to proceed with the robotic approach.  The patient was therefore sterilely prepped and draped in usual fashion.  A 3-way Foley catheter was sterilely placed.  A weighted speculum was placed in the vagina.  Sims retractor was placed anteriorly.  The cervix was slightly friable.  The 0 Vicryl, figure-of-eight suture was placed on the anterior and posterior lip of the cervix.  The uterus was initially sounded to 8 cm after the dilatation.  Using a RUMI manipulator with an #8 uterine manipulator and a small KOH cup, several attempts of inserting the tip of the balloon into the uterine cavity was unsuccessful with and without the retractors.  It was limited by sidewall as well as overall narrowness  of the vagina.  After over a half an hour manipulation, the uterus was re-dilated, sounded to 9, decision was made to use a #10 uterine manipulator as the initial previous RUMI apparatus did not fit while the distal portion of the apparatus would reach the cervix, the remaining cup portion to cover the cervicovaginal junction would not reach.  Again, the trial of the already self made RUMI retractor with a smaller ring, the 2.5 instead of the 3 was attempted in the same process was found. Subsequently, overall success was achieved with a #10 intrauterine balloon applicator and medium-sized RUMI cuff being placed.  Once this was done, attention was then turned to the abdomen.  0.25% Marcaine was injected infraumbilically.  Infraumbilical incision was made.  Bleeding was encountered.  Cauterization was occurred.  Veress needle was inserted in about 2 or 3 occasions and ultimately was positioned, tested with normal saline.  With insufflation, the machine did not appear to be giving any  evidence of insulation of the gas, but the abdomen was being distended.  After  the abdomen appeared to be well distended, the Veress needle was removed.  The #12 mm disposable trocar with sleeve was introduced into the abdomen and the robotic camera port was placed to that site.  On entry, there was no trauma.  Upper abdomen was noted  to have normal liver edge and gallbladder.  There were lots of preperitoneal fat was noted.  The patient's Trendelenburg position was achieved.  The uterus was then noted to be markedly distorted by the fibroids and a large right posterior fibroid was noted.  Manipulation revealed that there were some bowel adhesions in the left pelvic side wall and no evidence of endometriosis, but the posterior cul-de-sac was very limited in visualization.  Decision was continued with respect to high flow of carbon dioxide was achieved.  Right and left lower quadrant incisions were made after  0.25% Marcaine and 8-mm robotic port sites were placed under direct visualization and a 5-mm assistant port was placed between the camera site and the right lower quadrant under direct visualization as well.  The robot was then docked to the patient's bedside on the left and in arm #2, was the PK dissector and arm #1 was the monopolar scissors.  I then went to the surgical console.  At the surgical console, just overall inspection of the pelvis was reviewed. The adhesions of the bowels on the left were removed as they were in the way of the left adnexa.  The fallopian tube was normal.  Ovaries normal. The retroperitoneal space on the left was opened.  Some bleeding was encountered.  The dissection was continued down more distally and ultimately, the ureter which was identified peristalsing and going under the bridge of the left uterine vessel was encountered.  At that point, a window in the posterior leaf was done and the left uteroovarian ligament was serially clamped, cauterized, and then cut.  The posterior leaf was then carried back further, however, there was limited visibility because of the fibroid.  The round ligament was clamped, cauterized, and cut. The anterior vesicouterine peritoneum was opened transversely and the bladder was then sharply dissected off the RUMI cup and displaced inferiorly.  Small bleeders were cauterized in the interim time.  The uterine vessels were noted.  The left fallopian tube which was still attached.  Mesosalpinx was serially clamped, cauterized, and then cut until that the left tube was removed.  On the right, the ureter was identified.  The right retroperitoneal space was opened.  Dissection was carried down, however, the ureter was not initially seen.  The clear window in the retroperitoneal space on the right was done.  The fallopian tube was grasped, cauterized in its mesosalpinx and serially clamped and cut away from the right ovary.  This  allowed further right uteroovarian ligament to be noted.  Both sides were shortened and were clamped, cauterized, and then cut.  The skeletonization on the uterine vessels were done.  Further dissection of the retroperitoneal space again found the ureter peristalsing and actually diving in the tunnel under which the ureter traversed with the right uterine vessels being noted.  At that point, the small portion of the posterior peritoneum was then opened, a little bit on the right and small amount on the left.  The vaginal insufflator was then started.  The circumferential incision of the cervicovaginal junction at the superior aspect of the RUMI cup was done anteriorly and carried down around to the right.  The uterine vessels, which were isolated was easily clamped, cauterized, and then cut.  The circumferential incision was then carried down posteriorly a little bit on the right, carried back to the opposite side.  The posterior aspect of the cervicovaginal junction was opened little bit on the left side, and then isolating the uterine vessels,  which were then serially clamped, cauterized, and then cut.  With shifting, the cervicovaginal junction was completely separated.  At that point, keeping traction on this uterus and having a grounded to the pelvis, the fibroid uterus using a scissors was wedged and bivalved not incompletely, but portion of the fibroid uterus was taken off as specimen allowing for half of it need to be removed vaginally and the other piece that was present was then brought down into the vagina and removed as well as the left free fallopian tube.  The right tube had been still attached to the uterine fundus.  Once the specimen was out from the bleeding, then the vaginal cuff was cauterized.  During the process of separating the mesosalpinx, there was evidence of a small superficial blanching what was considered burn on the bowel from the PK scissors.  The covering  of the robotic instrument was then exchanged out as we were suspicious that there was a rent in it.  The procedure was continued.  The bowel was watched carefully and then subsequently, that area was re-perfused without seeing evidence of any long term issue.  The instruments were then exchanged with a long-tip forceps and a large Mega needle driver. O V Loc 9 inch  Suture was placed and the vaginal cuff was closed partially in a running stitch and then another V-Loc was then done from the opposite side with final closure of the vaginal cuff.  The ureters were once again found and noted to be peristalsing underneath the bridge of the uterine vessels.  The abdomen was then irrigated and suctioned.  Good hemostasis noted.  Both pedicles were inspected and at that point, the procedure was terminated from the robotic standpoint with the instruments being removed.  The robot was undocked.  The needles were removed.  The vaginal cuff was digitally palpated x2 with good approximation noted.  The abdomen was deflated.  Care was taken to bring out the robotic instruments under direct visualization prior to deflating the abdomen.  The incisions were then closed with 4-0 Vicryl subcuticular stitches, the infraumbilical fascial stitch with 0 Vicryl,and the skin approximated with 4-0 Vicryl suture.  SPECIMEN: uterus with cervix and tube weighing over 300 g.  ESTIMATED BLOOD LOSS:  75 mL.  INTRAOPERATIVE FLUID:  2300 mL.  URINE OUTPUT:  225 mL.  Sponge and instrument counts x2 was correct.  COMPLICATIONS:  None.  The patient tolerated the procedure well, was transferred to recovery room in stable condition.     Servando Salina, M.D.     [place]/MEDQ  D:  12/12/2013  T:  12/13/2013  Job:  [id number]

## 2013-12-13 NOTE — Discharge Summary (Signed)
Physician Discharge Summary  Patient ID: Erin Hall MRN: [id number] DOB/AGE: 1961-07-26 53 y.o.  Admit date: 12/12/2013 Discharge date: 12/13/2013  Admission Diagnoses: symptomatic uterine fibroids  Discharge Diagnoses: symptomatic uterine fibroids Active Problems:   S/P hysterectomy hypothyroidism  Discharged Condition: stable  Hospital Course: Pt was admitted to [facility] where she underwent daVinci robotic total hysterectomy, bilateral salpingectomy. Uncomplicated postop course  Consults: None  Significant Diagnostic Studies: labs:  CBC    Component Value Date/Time   WBC 10.0 12/13/2013 0540   RBC 4.38 12/13/2013 0540   HGB 12.7 12/13/2013 0540   HCT 37.6 12/13/2013 0540   PLT 228 12/13/2013 0540   MCV 85.8 12/13/2013 0540   MCH 29.0 12/13/2013 0540   MCHC 33.8 12/13/2013 0540   RDW 13.1 12/13/2013 0540   LYMPHSABS 2.1 04/17/2012 0820   MONOABS 0.4 04/17/2012 0820   EOSABS 0.2 04/17/2012 0820   BASOSABS 0.0 04/17/2012 0820      Treatments: surgery: Da Vinci robotic total hysterectomy, bilateral salpingectomy  Discharge Exam: Blood pressure 107/69, pulse 88, temperature 98.2 F (36.8 C), temperature source Oral, resp. rate 18, height 5\' 4"  (1.626 m), weight 72.576 kg (160 lb), SpO2 98.00%. General appearance: alert, cooperative and no distress Back: no tenderness to percussion or palpation Resp: clear to auscultation bilaterally Breasts: surgical absent Pelvic: deferred Incision/Wound: sl erythema @ umb site, well approximated  Disposition: 01-Home or Self Care  Discharge Orders   Future Appointments Provider Department Dept Phone   06/30/2014 1:30 PM [facility] [phone]   Future Orders Complete By Expires   Diet general  As directed    Increase activity slowly  As directed    May walk up steps  As directed    No wound care  As directed        Medication List    STOP taking these medications       celecoxib 200 MG capsule   Commonly known as:  CELEBREX      TAKE these medications       cetirizine 10 MG tablet  Commonly known as:  ZYRTEC  Take 10 mg by mouth every evening.     cholecalciferol 1000 UNITS tablet  Commonly known as:  VITAMIN D  Take 1,000 Units by mouth every evening.     fish oil-omega-3 fatty acids 1000 MG capsule  Take 1 g by mouth every evening.     ibuprofen 800 MG tablet  Commonly known as:  ADVIL,MOTRIN  Take 1 tablet (800 mg total) by mouth every 8 (eight) hours as needed (mild pain).     levothyroxine 88 MCG tablet  Commonly known as:  SYNTHROID, LEVOTHROID  Take 88 mcg by mouth daily before breakfast.     LOESTRIN 24 FE 1-20 MG-MCG(24) tablet  Generic drug:  Norethindrone Acetate-Ethinyl Estrad-FE  Take 1 tablet by mouth daily.     multivitamin with minerals Tabs tablet  Take 1 tablet by mouth every evening.     oxyCODONE-acetaminophen 5-325 MG per tablet  Commonly known as:  PERCOCET/ROXICET  Take 1-2 tablets by mouth every 4 (four) hours as needed for severe pain (moderate to severe pain (when tolerating fluids)).     vitamin C 500 MG tablet  Commonly known as:  ASCORBIC ACID  Take 500 mg by mouth every evening.           Follow-up Information   Follow up with Cybele Maule A, MD In 2 weeks.   Specialty:  Obstetrics and  Gynecology   Contact information:   [address] [place] [address] [phone]       Signed: Alanda Slim A Rosell Khouri 12/13/2013, 7:45 AM

## 2013-12-13 NOTE — Progress Notes (Signed)
Subjective: Patient reports tolerating PO and no problems voiding.    Objective: I have reviewed patient's vital signs.  vital signs, intake and output and labs. Filed Vitals:   12/13/13 0559  BP: 107/69  Pulse: 88  Temp: 98.2 F (36.8 C)  Resp: 18   I/O last 3 completed shifts: In: 2836.5 [P.O.:240; I.V.:2596.5] Out: 59 [Urine:850; Blood:75]    Lab Results  Component Value Date   WBC 10.0 12/13/2013   HGB 12.7 12/13/2013   HCT 37.6 12/13/2013   MCV 85.8 12/13/2013   PLT 228 12/13/2013   Lab Results  Component Value Date   CREATININE 0.73 12/13/2013    EXAM General: alert, cooperative and no distress Resp: clear to auscultation bilaterally Cardio: regular rate and rhythm, S1, S2 normal, no murmur, click, rub or gallop GI: soft, non-tender; bowel sounds normal; no masses,  no organomegaly and incision: clean, dry and intact Extremities: extremities normal, atraumatic, no cyanosis or edema Vaginal Bleeding: none Back: no CVAT Assessment: s/p Procedure(s): ROBOTIC ASSISTED TOTAL HYSTERECTOMY BILATERAL SALPINGECTOMY: stable, progressing well and tolerating diet  Plan: Advance diet Encourage ambulation Discontinue IV fluids Discharge home  LOS: 1 day    Marvene Staff, MD 12/13/2013 7:45 AM    12/13/2013, 7:45 AM

## 2013-12-13 NOTE — Progress Notes (Signed)
Understands a;; instr Pt is discharged in the care of Mother with [facility]. Denies any pain or discomfort. Spirits Narogood States she understands all instructions well. Questions were asked and answered.. Lapsites are clean and dry no equipment needed for home.

## 2013-12-13 NOTE — Addendum Note (Signed)
Addendum created 12/13/13 0846 by Tobin Chad, CRNA   Modules edited: Notes Section   Notes Section:  File: [id number]

## 2014-06-30 ENCOUNTER — Ambulatory Visit (HOSPITAL_COMMUNITY): Admission: RE | Admit: 2014-06-30 | Payer: 59 | Source: Ambulatory Visit

## 2014-07-30 ENCOUNTER — Emergency Department (HOSPITAL_COMMUNITY): Payer: 59

## 2014-07-30 ENCOUNTER — Emergency Department (HOSPITAL_COMMUNITY)
Admission: EM | Admit: 2014-07-30 | Discharge: 2014-07-30 | Disposition: A | Discharge status: Home / Self Care | Payer: 59 | Attending: Emergency Medicine | Admitting: Emergency Medicine

## 2014-07-30 ENCOUNTER — Encounter (HOSPITAL_COMMUNITY): Payer: Self-pay | Admitting: Adult Health

## 2014-07-30 DIAGNOSIS — Z791 Long term (current) use of non-steroidal anti-inflammatories (NSAID): Secondary | ICD-10-CM | POA: Insufficient documentation

## 2014-07-30 DIAGNOSIS — Z79899 Other long term (current) drug therapy: Secondary | ICD-10-CM | POA: Insufficient documentation

## 2014-07-30 DIAGNOSIS — N83202 Unspecified ovarian cyst, left side: Secondary | ICD-10-CM

## 2014-07-30 DIAGNOSIS — M545 Low back pain: Secondary | ICD-10-CM | POA: Insufficient documentation

## 2014-07-30 DIAGNOSIS — R630 Anorexia: Secondary | ICD-10-CM | POA: Insufficient documentation

## 2014-07-30 DIAGNOSIS — N830 Follicular cyst of ovary: Secondary | ICD-10-CM | POA: Diagnosis not present

## 2014-07-30 DIAGNOSIS — Z90710 Acquired absence of both cervix and uterus: Secondary | ICD-10-CM | POA: Diagnosis not present

## 2014-07-30 DIAGNOSIS — R1032 Left lower quadrant pain: Secondary | ICD-10-CM | POA: Insufficient documentation

## 2014-07-30 DIAGNOSIS — E039 Hypothyroidism, unspecified: Secondary | ICD-10-CM | POA: Insufficient documentation

## 2014-07-30 DIAGNOSIS — R109 Unspecified abdominal pain: Secondary | ICD-10-CM | POA: Diagnosis present

## 2014-07-30 LAB — CBC WITH DIFFERENTIAL/PLATELET
BASOS ABS: 0 10*3/uL (ref 0.0–0.1)
Basophils Relative: 0 % (ref 0–1)
EOS PCT: 4 % (ref 0–5)
Eosinophils Absolute: 0.3 10*3/uL (ref 0.0–0.7)
HEMATOCRIT: 41 % (ref 36.0–46.0)
Hemoglobin: 14 g/dL (ref 12.0–15.0)
LYMPHS ABS: 2.5 10*3/uL (ref 0.7–4.0)
Lymphocytes Relative: 39 % (ref 12–46)
MCH: 28.7 pg (ref 26.0–34.0)
MCHC: 34.1 g/dL (ref 30.0–36.0)
MCV: 84.2 fL (ref 78.0–100.0)
MONO ABS: 0.7 10*3/uL (ref 0.1–1.0)
Monocytes Relative: 10 % (ref 3–12)
Neutro Abs: 3.1 10*3/uL (ref 1.7–7.7)
Neutrophils Relative %: 47 % (ref 43–77)
Platelets: 319 10*3/uL (ref 150–400)
RBC: 4.87 MIL/uL (ref 3.87–5.11)
RDW: 12.8 % (ref 11.5–15.5)
WBC: 6.6 10*3/uL (ref 4.0–10.5)

## 2014-07-30 LAB — URINALYSIS, ROUTINE W REFLEX MICROSCOPIC
Bilirubin Urine: NEGATIVE
GLUCOSE, UA: NEGATIVE mg/dL
HGB URINE DIPSTICK: NEGATIVE
Ketones, ur: NEGATIVE mg/dL
LEUKOCYTES UA: NEGATIVE
Nitrite: NEGATIVE
PH: 6 (ref 5.0–8.0)
Protein, ur: NEGATIVE mg/dL
SPECIFIC GRAVITY, URINE: 1.009 (ref 1.005–1.030)
Urobilinogen, UA: 0.2 mg/dL (ref 0.0–1.0)

## 2014-07-30 LAB — I-STAT CHEM 8, ED
BUN: 13 mg/dL (ref 6–23)
Calcium, Ion: 1.15 mmol/L (ref 1.12–1.23)
Chloride: 102 mEq/L (ref 96–112)
Creatinine, Ser: 0.7 mg/dL (ref 0.50–1.10)
Glucose, Bld: 92 mg/dL (ref 70–99)
HCT: 45 % (ref 36.0–46.0)
Hemoglobin: 15.3 g/dL — ABNORMAL HIGH (ref 12.0–15.0)
Potassium: 3.7 mEq/L (ref 3.7–5.3)
SODIUM: 138 meq/L (ref 137–147)
TCO2: 21 mmol/L (ref 0–100)

## 2014-07-30 MED ORDER — OXYCODONE-ACETAMINOPHEN 5-325 MG PO TABS: 1.0000 | ORAL_TABLET | ORAL | Status: DC | PRN

## 2014-07-30 MED ORDER — SODIUM CHLORIDE 0.9 % IV BOLUS (SEPSIS): 500.0000 mL | Freq: Once | INTRAVENOUS | Status: DC

## 2014-07-30 MED ORDER — IOHEXOL 300 MG/ML  SOLN
100.0000 mL | Freq: Once | INTRAMUSCULAR | Status: AC | PRN
  Administered 2014-07-30: 100 mL via INTRAVENOUS

## 2014-07-30 NOTE — ED Provider Notes (Signed)
CSN: [id number]     Arrival date & time 07/30/14  1859 History   First MD Initiated Contact with Patient 07/30/14 1922     Chief Complaint  Patient presents with  . Abdominal Pain     (Consider location/radiation/quality/duration/timing/severity/associated sxs/prior Treatment) Patient is a 53 y.o. female presenting with abdominal pain. The history is provided by the patient.  Abdominal Pain Associated symptoms: no chest pain, no constipation, no diarrhea, no nausea, no shortness of breath and no vomiting    patient presents with left lower quadrant abdominal pain for the last couple days. It is dull. She states there is no dysuria. She's had a mildly decreased appetite. She has had some feeling of bloating. No fevers. No chills. She's had a previous hysterectomy. No history of diverticulitis. No trauma. The pain is dull and has some radiation to the back.  Past Medical History  Diagnosis Date  . Hypothyroidism   . Hyperlipidemia   . History of seasonal allergies    Past Surgical History  Procedure Laterality Date  . Breast reduction surgery    . Knee surgery    . Uterine artery embolization  04/17/2012  . Robotic assisted total hysterectomy N/A 12/12/2013    Procedure: ROBOTIC ASSISTED TOTAL HYSTERECTOMY;  Surgeon: Marvene Staff, MD;  Location: [facility];  Service: Gynecology;  Laterality: N/A;  . Bilateral salpingectomy N/A 12/12/2013    Procedure: BILATERAL SALPINGECTOMY;  Surgeon: Marvene Staff, MD;  Location: [facility];  Service: Gynecology;  Laterality: N/A;   History reviewed. No pertinent family history. History  Substance Use Topics  . Smoking status: Never Smoker   . Smokeless tobacco: Never Used  . Alcohol Use: No   OB History    No data available     Review of Systems  Constitutional: Positive for appetite change. Negative for activity change.  Eyes: Negative for pain.  Respiratory: Negative for chest tightness and shortness of breath.   Cardiovascular:  Negative for chest pain and leg swelling.  Gastrointestinal: Positive for abdominal pain. Negative for nausea, vomiting, diarrhea and constipation.  Genitourinary: Negative for flank pain.  Musculoskeletal: Positive for back pain. Negative for neck stiffness.  Skin: Negative for rash.  Neurological: Negative for weakness, numbness and headaches.  Psychiatric/Behavioral: Negative for behavioral problems.      Allergies  Review of patient's allergies indicates no known allergies.  Home Medications   Prior to Admission medications   Medication Sig Start Date End Date Taking? Authorizing Provider  celecoxib (CELEBREX) 200 MG capsule Take 200 mg by mouth daily.   Yes Historical Provider, MD  cetirizine (ZYRTEC) 10 MG tablet Take 10 mg by mouth every evening.   Yes Historical Provider, MD  cholecalciferol (VITAMIN D) 1000 UNITS tablet Take 1,000 Units by mouth every evening.   Yes Historical Provider, MD  fish oil-omega-3 fatty acids 1000 MG capsule Take 1 g by mouth every evening.   Yes Historical Provider, MD  levothyroxine (SYNTHROID, LEVOTHROID) 75 MCG tablet Take 75 mcg by mouth daily before breakfast.  05/09/14  Yes Historical Provider, MD  MINIVELLE 0.1 MG/24HR patch Place 1 patch onto the skin 2 (two) times a week.  05/09/14  Yes Historical Provider, MD  Multiple Vitamin (MULTIVITAMIN WITH MINERALS) TABS Take 1 tablet by mouth every evening.   Yes Historical Provider, MD  vitamin C (ASCORBIC ACID) 500 MG tablet Take 500 mg by mouth every evening.   Yes Historical Provider, MD  ibuprofen (ADVIL,MOTRIN) 800 MG tablet Take 1 tablet (800  mg total) by mouth every 8 (eight) hours as needed (mild pain). Patient not taking: Reported on 07/30/2014 12/13/13   Marvene Staff, MD  oxyCODONE-acetaminophen (PERCOCET/ROXICET) 5-325 MG per tablet Take 1-2 tablets by mouth every 4 (four) hours as needed for moderate pain. 07/30/14   Jasper Riling. Lyan Holck, MD   BP 126/65 mmHg  Pulse 78  Temp(Src) 98  F (36.7 C)  Resp 17  Wt 165 lb 5 oz (74.985 kg)  SpO2 100%  LMP 02/27/2014 Physical Exam  Constitutional: She is oriented to person, place, and time. She appears well-developed and well-nourished.  HENT:  Head: Normocephalic and atraumatic.  Eyes: EOM are normal. Pupils are equal, round, and reactive to light.  Neck: Normal range of motion. Neck supple.  Cardiovascular: Normal rate, regular rhythm and normal heart sounds.   No murmur heard. Pulmonary/Chest: Effort normal and breath sounds normal. No respiratory distress. She has no wheezes. She has no rales.  Abdominal: Soft. Bowel sounds are normal. She exhibits no distension. There is tenderness. There is no rebound and no guarding.  Mild left lower quadrant tenderness without rebound or guarding. No rash.  Genitourinary:  No CVA tenderness  Musculoskeletal: Normal range of motion.  Neurological: She is alert and oriented to person, place, and time. No cranial nerve deficit.  Skin: Skin is warm and dry.  Psychiatric: She has a normal mood and affect. Her speech is normal.  Nursing note and vitals reviewed.   ED Course  Procedures (including critical care time) Labs Review Labs Reviewed  URINALYSIS, ROUTINE W REFLEX MICROSCOPIC - Abnormal; Notable for the following:    APPearance CLOUDY (*)    All other components within normal limits  I-STAT CHEM 8, ED - Abnormal; Notable for the following:    Hemoglobin 15.3 (*)    All other components within normal limits  CBC WITH DIFFERENTIAL    Imaging Review No results found.   EKG Interpretation None      MDM   Final diagnoses:  LLQ abdominal pain  Cyst of left ovary     patient with left lower quadrant abdominal pain. Has ovarian cyst. Lab work reassuring. Will discharge home.    Jasper Riling. Alvino Chapel, MD 08/02/14 [phone]

## 2014-07-30 NOTE — ED Notes (Signed)
Resents with left lower abdominal pain began 2 days ago and has progressively worsened-denies vaginal discharge, denies urinary symtpoms-pain is described as dull ache, denies nausea, vomiting-Hysterectomy 6 months ago. Tenderness to touch-endorses abdominal distention and bloating.

## 2014-07-30 NOTE — Discharge Instructions (Signed)
Ovarian Cyst An ovarian cyst is a fluid-filled sac that forms on an ovary. The ovaries are small organs that produce eggs in women. Various types of cysts can form on the ovaries. Most are not cancerous. Many do not cause problems, and they often go away on their own. Some may cause symptoms and require treatment. Common types of ovarian cysts include:  Functional cysts--These cysts may occur every month during the menstrual cycle. This is normal. The cysts usually go away with the next menstrual cycle if the woman does not get pregnant. Usually, there are no symptoms with a functional cyst.  Endometrioma cysts--These cysts form from the tissue that lines the uterus. They are also called "chocolate cysts" because they become filled with blood that turns brown. This type of cyst can cause pain in the lower abdomen during intercourse and with your menstrual period.  Cystadenoma cysts--This type develops from the cells on the outside of the ovary. These cysts can get very big and cause lower abdomen pain and pain with intercourse. This type of cyst can twist on itself, cut off its blood supply, and cause severe pain. It can also easily rupture and cause a lot of pain.  Dermoid cysts--This type of cyst is sometimes found in both ovaries. These cysts may contain different kinds of body tissue, such as skin, teeth, hair, or cartilage. They usually do not cause symptoms unless they get very big.  Theca lutein cysts--These cysts occur when too much of a certain hormone (human chorionic gonadotropin) is produced and overstimulates the ovaries to produce an egg. This is most common after procedures used to assist with the conception of a baby (in vitro fertilization). CAUSES   Fertility drugs can cause a condition in which multiple large cysts are formed on the ovaries. This is called ovarian hyperstimulation syndrome.  A condition called polycystic ovary syndrome can cause hormonal imbalances that can lead to  nonfunctional ovarian cysts. SIGNS AND SYMPTOMS  Many ovarian cysts do not cause symptoms. If symptoms are present, they may include:  Pelvic pain or pressure.  Pain in the lower abdomen.  Pain during sexual intercourse.  Increasing girth (swelling) of the abdomen.  Abnormal menstrual periods.  Increasing pain with menstrual periods.  Stopping having menstrual periods without being pregnant. DIAGNOSIS  These cysts are commonly found during a routine or annual pelvic exam. Tests may be ordered to find out more about the cyst. These tests may include:  Ultrasound.  X-ray of the pelvis.  CT scan.  MRI.  Blood tests. TREATMENT  Many ovarian cysts go away on their own without treatment. Your health care provider may want to check your cyst regularly for 2-3 months to see if it changes. For women in menopause, it is particularly important to monitor a cyst closely because of the higher rate of ovarian cancer in menopausal women. When treatment is needed, it may include any of the following:  A procedure to drain the cyst (aspiration). This may be done using a long needle and ultrasound. It can also be done through a laparoscopic procedure. This involves using a thin, lighted tube with a tiny camera on the end (laparoscope) inserted through a small incision.  Surgery to remove the whole cyst. This may be done using laparoscopic surgery or an open surgery involving a larger incision in the lower abdomen.  Hormone treatment or birth control pills. These methods are sometimes used to help dissolve a cyst. HOME CARE INSTRUCTIONS   Only take over-the-counter   or prescription medicines as directed by your health care provider.  Follow up with your health care provider as directed.  Get regular pelvic exams and Pap tests. SEEK MEDICAL CARE IF:   Your periods are late, irregular, or painful, or they stop.  Your pelvic pain or abdominal pain does not go away.  Your abdomen becomes  larger or swollen.  You have pressure on your bladder or trouble emptying your bladder completely.  You have pain during sexual intercourse.  You have feelings of fullness, pressure, or discomfort in your stomach.  You lose weight for no apparent reason.  You feel generally ill.  You become constipated.  You lose your appetite.  You develop acne.  You have an increase in body and facial hair.  You are gaining weight, without changing your exercise and eating habits.  You think you are pregnant. SEEK IMMEDIATE MEDICAL CARE IF:   You have increasing abdominal pain.  You feel sick to your stomach (nauseous), and you throw up (vomit).  You develop a fever that comes on suddenly.  You have abdominal pain during a bowel movement.  Your menstrual periods become heavier than usual. MAKE SURE YOU:  Understand these instructions.  Will watch your condition.  Will get help right away if you are not doing well or get worse. Document Released: 08/08/2005 Document Revised: 08/13/2013 Document Reviewed: 04/15/2013 Exit[employer] Patient Information 2015 [employer]. This information is not intended to replace advice given to you by your health care provider. Make sure you discuss any questions you have with your health care provider.  

## 2014-11-06 ENCOUNTER — Telehealth: Payer: Self-pay | Admitting: Vascular Surgery

## 2014-11-06 ENCOUNTER — Other Ambulatory Visit (HOSPITAL_COMMUNITY): Payer: Self-pay | Admitting: Vascular Surgery

## 2014-11-06 ENCOUNTER — Ambulatory Visit (HOSPITAL_COMMUNITY)
Admission: RE | Admit: 2014-11-06 | Discharge: 2014-11-06 | Disposition: A | Discharge status: Home / Self Care | Payer: 59 | Source: Ambulatory Visit | Attending: Family Medicine | Admitting: Family Medicine

## 2014-11-06 DIAGNOSIS — M25561 Pain in right knee: Secondary | ICD-10-CM

## 2014-11-06 DIAGNOSIS — I82401 Acute embolism and thrombosis of unspecified deep veins of right lower extremity: Secondary | ICD-10-CM

## 2014-11-06 DIAGNOSIS — M7989 Other specified soft tissue disorders: Secondary | ICD-10-CM | POA: Diagnosis not present

## 2014-11-06 NOTE — Progress Notes (Signed)
VASCULAR LAB PRELIMINARY  PRELIMINARY  PRELIMINARY  PRELIMINARY  Right lower extremity venous duplex completed.    Preliminary report:  Right:  No evidence of DVT, superficial thrombosis, or Baker's cyst.  Erin Hall, RVT 11/06/2014, 1:13 PM

## 2014-11-06 NOTE — Telephone Encounter (Signed)
Patient called this morning regarding right leg swelling. Had seen [place] in the remote past. Works as a Marine scientist at [employer].  Reported right leg swelling and was concerned regarding possibility of DVT with the recent flight. Explained that this is typically taken care of by her primary care physician but that I could coordinate her obtaining a noninvasive vascular lab this morning to rule out DVT. I communicated this with the vascular lab at [facility] a Right Leg Venous Duplex. They Will Notify Me of the Results. If This Is Positive Will Be Directed to Seek Assistance from Her Primary Care Physician.

## 2014-12-18 ENCOUNTER — Other Ambulatory Visit: Payer: Self-pay | Admitting: Endodontics

## 2015-03-19 ENCOUNTER — Other Ambulatory Visit: Payer: Self-pay | Admitting: Physician Assistant

## 2015-03-19 DIAGNOSIS — G8929 Other chronic pain: Secondary | ICD-10-CM

## 2015-03-19 DIAGNOSIS — M25561 Pain in right knee: Principal | ICD-10-CM

## 2015-03-19 DIAGNOSIS — M25562 Pain in left knee: Secondary | ICD-10-CM

## 2015-03-23 ENCOUNTER — Other Ambulatory Visit (HOSPITAL_COMMUNITY): Payer: Self-pay | Admitting: Physician Assistant

## 2015-03-23 ENCOUNTER — Ambulatory Visit (HOSPITAL_COMMUNITY)
Admission: RE | Admit: 2015-03-23 | Discharge: 2015-03-23 | Disposition: A | Discharge status: Home / Self Care | Payer: 59 | Source: Ambulatory Visit | Attending: Physician Assistant | Admitting: Physician Assistant

## 2015-03-23 DIAGNOSIS — M179 Osteoarthritis of knee, unspecified: Secondary | ICD-10-CM | POA: Diagnosis not present

## 2015-03-23 DIAGNOSIS — M7121 Synovial cyst of popliteal space [Baker], right knee: Secondary | ICD-10-CM | POA: Diagnosis not present

## 2015-03-23 DIAGNOSIS — M25561 Pain in right knee: Secondary | ICD-10-CM

## 2015-03-23 DIAGNOSIS — G8929 Other chronic pain: Secondary | ICD-10-CM

## 2015-03-23 DIAGNOSIS — M659 Synovitis and tenosynovitis, unspecified: Secondary | ICD-10-CM | POA: Insufficient documentation

## 2015-03-23 DIAGNOSIS — M25461 Effusion, right knee: Secondary | ICD-10-CM | POA: Diagnosis not present

## 2015-05-07 ENCOUNTER — Ambulatory Visit: Payer: 59 | Attending: Family Medicine | Admitting: Physical Therapy

## 2015-05-07 DIAGNOSIS — M25562 Pain in left knee: Secondary | ICD-10-CM | POA: Insufficient documentation

## 2015-05-07 DIAGNOSIS — M24661 Ankylosis, right knee: Secondary | ICD-10-CM | POA: Insufficient documentation

## 2015-05-07 DIAGNOSIS — M24662 Ankylosis, left knee: Secondary | ICD-10-CM | POA: Diagnosis present

## 2015-05-07 DIAGNOSIS — M25461 Effusion, right knee: Secondary | ICD-10-CM | POA: Diagnosis present

## 2015-05-07 DIAGNOSIS — R269 Unspecified abnormalities of gait and mobility: Secondary | ICD-10-CM | POA: Insufficient documentation

## 2015-05-07 DIAGNOSIS — M25561 Pain in right knee: Secondary | ICD-10-CM | POA: Diagnosis present

## 2015-05-07 NOTE — Patient Instructions (Signed)
   Kristoffer Leamon PT, DPT, LAT, ATC  [facility] Phone: [phone]     

## 2015-05-07 NOTE — Therapy (Signed)
[place], [place], [address] Phone: [phone]   Fax:  [phone]  Physical Therapy Evaluation  Patient Details  Name: Erin Hall MRN: [id number] Date of Birth: [birth year] Referring Provider:  [name], MD  Encounter Date: 05/07/2015      PT End of Session - 05/07/15 1756    Visit Number 1   Number of Visits 12   Date for PT Re-Evaluation 06/18/15   PT Start Time 1500   PT Stop Time 1545   PT Time Calculation (min) 45 min   Activity Tolerance Patient tolerated treatment well   Behavior During Therapy [facility] for tasks assessed/performed      Past Medical History  Diagnosis Date  . Hypothyroidism   . Hyperlipidemia   . History of seasonal allergies     Past Surgical History  Procedure Laterality Date  . Breast reduction surgery    . Knee surgery    . Uterine artery embolization  04/17/2012  . Robotic assisted total hysterectomy N/A 12/12/2013    Procedure: ROBOTIC ASSISTED TOTAL HYSTERECTOMY;  Surgeon: Marvene Staff, MD;  Location: [facility];  Service: Gynecology;  Laterality: N/A;  . Bilateral salpingectomy N/A 12/12/2013    Procedure: BILATERAL SALPINGECTOMY;  Surgeon: Marvene Staff, MD;  Location: [facility];  Service: Gynecology;  Laterality: N/A;    There were no vitals filed for this visit.  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  pt reports that had a lot of fluid removed from her left knee on 04/09/2015 ago and had a hisotry of hylgaln injections which she reports that the hasn't helped.    Limitations Walking;Standing;House hold activities   How long can you sit comfortably? 1 hour   How long can you stand comfortably? unlimited   How long can you walk comfortably? unlimited   Diagnostic tests 04/02/2015 MRI bakers cyst, and meniscus tear   Patient Stated Goals to be pain free   Currently in Pain? Yes   Pain Score 0-No pain  with steps 8/10   Pain Location Knee   Pain Orientation Right;Left   Pain Descriptors / Indicators Aching   Pain Type Chronic pain   Pain Onset More than a month ago   Pain Frequency Constant   Aggravating Factors  stairs, prolonged standing, getting up from sitting   Pain Relieving Factors sitting and resting, celebrex            [facility] PT Assessment - 05/07/15 1506    Assessment   Medical Diagnosis bil knee pain   Onset Date/Surgical Date --  for a long time   Hand Dominance Right   Next MD Visit to make on PRN   Prior Therapy yes  knee   Precautions   Precautions None   Restrictions   Weight Bearing Restrictions No   Balance Screen  Has the patient fallen in the past 6 months No   Has the patient had a decrease in activity level because of a fear of falling?  No   Is the patient reluctant to leave their home because of a fear of falling?  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   Vocation Requirements prolonged standing, lifitng, carrying, walking   Leisure travelling, zumba, going to the movies,  yard work    Cognition   Overall Cognitive Status Within Functional Limits for tasks assessed   Observation/Other Assessments   Focus on Therapeutic Outcomes (FOTO)  58% limited  predicted 42% limited   Posture/Postural Control   Posture/Postural Control Postural limitations   Postural Limitations Rounded Shoulders;Forward head   ROM / Strength   AROM / PROM / Strength AROM;PROM;Strength   AROM   AROM Assessment Site Knee   Right/Left Knee Right;Left   Right Knee Extension -2   Right Knee Flexion 115   Left Knee Extension -2   Left Knee Flexion 116   PROM   PROM Assessment Site Knee   Right/Left Knee Right;Left   Right Knee Extension 0   Right Knee Flexion 122   Left Knee Extension 0   Left Knee Flexion 125  firm endfel   Strength   Strength Assessment Site Knee   Right/Left Knee Right;Left   Right Knee Flexion 4+/5   Right Knee Extension 4+/5   Left Knee Flexion 4+/5   Left Knee Extension 4+/5   Palpation   Patella mobility R patellar hypermobility, and L patellar hypomobility    Palpation comment tendnerness bil peripatellar    Special Tests    Special Tests Knee Special Tests   Knee Special tests  Step-up/Step Down Test;Patellofemoral Grind Test (Clarke's Sign);Patellofemoral Apprehension Test   Patellofemoral Apprehension Test    Findings Negative   Side  --  bil   Step-up/Step Down    Findings Positive   Side  --  bil   Patellofemoral Grind test (Clark's Sign)   Findings Postive   Side  --  bil                           PT Education - 05/07/15 1755    Education provided Yes   Education Details evaluation findings, POC, Goals, HEp, addressed pt questions   Person(s) Educated Patient   Methods Explanation   Comprehension Verbalized understanding          PT Short Term Goals - 05/07/15 1800    PT SHORT TERM GOAL #1   Title pt will be I with inital HEP (05/28/2015)   Time 3   Period Weeks   Status New   PT SHORT TERM GOAL #2    Title pt will be able to verbalize and demonstrate techniques to reduce bil knee swelling and pain via RICE and HEP (05/28/2015)           PT Long Term Goals - 05/07/15 1802    PT LONG TERM GOAL #1   Title pt will be  I with all HEP given throughout therapy (06/18/2015)   Time 6   Period Weeks   Status New   PT LONG TERM GOAL #2   Title pt will increase bil knee AROM to > 120 - 0 degrees to assist with an efficent gait pattern (06/18/2015)   Time 6   Period Weeks   Status New   PT LONG TERM GOAL #3   Title pt will be able to stand/ walk > 30 minutes with < 3/10 pain  to assist wtih prlonged walking and standing activities with her job (06/18/2015)   Time 6   Period Weeks   Status New   PT LONG TERM GOAL #4   Title pt will be bale to navigate up/down > 12 steps with < 3/10 pain to assist with job requirments and community ambulation (06/18/2015)   Time 6   Period Weeks   Status New   PT LONG TERM GOAL #5   Title pt will increase her FOTO score to > 58 to demonstrate improved function at discharge (06/18/2015)   Time [place] - 05/07/15 1756    Clinical Impression Statement Erin Hall presents to OPPT with CC of chronic bil knee pain with recent exacerbation. 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 she demonstrates mild limitation of AROM/PROM in bil knees with pain and tightness at end ranges. MMT was WNL. palaption revealed tenderness peripatellar with incresaed pain in the medail aspect of the R knee. She would benfit from physical therapy to maximze her function and decrease her pain by addressing the impairments listed.    Pt will benefit from skilled therapeutic intervention in order to improve on the following deficits Hypomobility;Pain;Postural dysfunction;Improper body mechanics;Decreased endurance;Decreased range of motion;Decreased balance;Decreased activity  tolerance;Decreased mobility;Increased muscle spasms;Increased edema   Rehab Potential Good   PT Frequency 2x / week   PT Duration 6 weeks   PT Treatment/Interventions ADLs/Self Care Home Management;Cryotherapy;Electrical Stimulation;Iontophoresis 4mg /ml Dexamethasone;Moist Heat;Ultrasound;Therapeutic activities;Therapeutic exercise;Manual techniques;Taping;Vasopneumatic Device;Dry needling;Passive range of motion;Patient/family education   PT Next Visit Plan assess response to HEP, knee moblity, hip strengthening, balance training, Modalities PRN   PT Home Exercise Plan standing hip extension/ abduction, prone knee bend, hamstring stretch, SLR   Consulted and Agree with Plan of Care Patient         Problem List Patient Active Problem List   Diagnosis Date Noted  . S/P hysterectomy 12/12/2013  . Leiomyoma of body of uterus 04/18/2012   Starr [place] PT, DPT, LAT, ATC  05/07/2015  6:08 PM      [facility] [address] [place], [place], [address] Phone: [phone]   Fax:  [phone]

## 2015-05-12 ENCOUNTER — Ambulatory Visit: Payer: 59 | Admitting: Physical Therapy

## 2015-05-26 ENCOUNTER — Ambulatory Visit: Payer: 59 | Admitting: Physical Therapy

## 2015-05-29 ENCOUNTER — Ambulatory Visit: Payer: 59 | Attending: Family Medicine | Admitting: Physical Therapy

## 2015-05-29 DIAGNOSIS — M24662 Ankylosis, left knee: Secondary | ICD-10-CM | POA: Diagnosis present

## 2015-05-29 DIAGNOSIS — M24661 Ankylosis, right knee: Secondary | ICD-10-CM | POA: Insufficient documentation

## 2015-05-29 DIAGNOSIS — R269 Unspecified abnormalities of gait and mobility: Secondary | ICD-10-CM | POA: Diagnosis present

## 2015-05-29 DIAGNOSIS — M25461 Effusion, right knee: Secondary | ICD-10-CM | POA: Insufficient documentation

## 2015-05-29 DIAGNOSIS — M25561 Pain in right knee: Secondary | ICD-10-CM | POA: Diagnosis present

## 2015-05-29 DIAGNOSIS — M25562 Pain in left knee: Secondary | ICD-10-CM | POA: Diagnosis present

## 2015-05-29 NOTE — Therapy (Signed)
[place], [place], [address] Phone: [phone]   Fax:  [phone]  Physical Therapy Treatment  Patient Details  Name: Erin Hall MRN: [id number] Date of Birth: September 07, 1960 Referring Provider:  [name], MD  Encounter Date: 05/29/2015      PT End of Session - 05/29/15 6803    Visit Number 2   Number of Visits 12   Date for PT Re-Evaluation 06/18/15   PT Start Time 0805   PT Stop Time 0900   PT Time Calculation (min) 55 min      Past Medical History  Diagnosis Date  . Hypothyroidism   . Hyperlipidemia   . History of seasonal allergies     Past Surgical History  Procedure Laterality Date  . Breast reduction surgery    . Knee surgery    . Uterine artery embolization  04/17/2012  . Robotic assisted total hysterectomy N/A 12/12/2013    Procedure: ROBOTIC ASSISTED TOTAL HYSTERECTOMY;  Surgeon: Marvene Staff, MD;  Location: [facility];  Service: Gynecology;  Laterality: N/A;  . Bilateral salpingectomy N/A 12/12/2013    Procedure: BILATERAL SALPINGECTOMY;  Surgeon: Marvene Staff, MD;  Location: [facility];  Service: Gynecology;  Laterality: N/A;    There were no vitals filed for this visit.  Visit Diagnosis:  Bilateral knee pain  Decreased range of knee movement, right  Decreased range of motion of knee, left  Abnormality of gait  Knee swelling, right      Subjective Assessment - 05/29/15 0807    Subjective The right knee is hurting more today and feels more tight, maybe swollen.    Currently in Pain? Yes   Pain Score 3    Pain Location Knee   Pain Orientation Right;Left   Aggravating Factors  stairs, prolonged standing, getting up from sitting prolonged   Pain Relieving Factors sitting, resting, celebrex            [facility] PT Assessment - 05/29/15 0814    AROM   Right Knee Flexion 118   Left Knee Flexion 118                     [facility] Adult PT  Treatment/Exercise - 05/29/15 0817    Knee/Hip Exercises: Stretches   Active Hamstring Stretch 3 reps;30 seconds   Knee: Self-Stretch to increase Flexion 3 reps;30 seconds   Knee: Self-Stretch Limitations prone with strap   Knee/Hip Exercises: Aerobic   Recumbent Bike L1 x 5 min   Knee/Hip Exercises: Seated   Long Arc Quad Strengthening;Both;2 sets;10 reps;Weights   Long Arc Quad Weight 3 lbs.   Knee/Hip Exercises: Supine   Straight Leg Raises Strengthening;Both;2 sets;10 reps   Straight Leg Raises Limitations 1 set with 2 # and cues to keep knee straight    Knee/Hip Exercises: Sidelying   Hip ABduction 2 sets;10 reps   Hip ADduction 2 sets;10 reps   Knee/Hip Exercises: Prone   Hip Extension 2 sets;10 reps   Hip Extension Limitations weakness   Modalities   Modalities Cryotherapy   Cryotherapy   Number Minutes Cryotherapy 15 Minutes   Cryotherapy Location Knee  bilateral   Type of Cryotherapy Ice pack                PT Education - 05/29/15 0902    Education provided Yes   Education Details 4 way SLR supine/sidelying   Methods Explanation;Handout   Comprehension Verbalized understanding  PT Short Term Goals - 05/29/15 0906    PT SHORT TERM GOAL #1   Title pt will be I with inital HEP (05/28/2015)   Time 3   Period Weeks   Status Achieved   PT SHORT TERM GOAL #2   Title pt will be able to verbalize and demonstrate techniques to reduce bil knee swelling and pain via RICE and HEP (05/28/2015)   Time 3   Period Weeks   Status On-going           PT Long Term Goals - 05/07/15 1802    PT LONG TERM GOAL #1   Title pt will be I with all HEP given throughout therapy (06/18/2015)   Time 6   Period Weeks   Status New   PT LONG TERM GOAL #2   Title pt will increase bil knee AROM to > 120 - 0 degrees to assist with an efficent gait pattern (06/18/2015)   Time 6   Period Weeks   Status New   PT LONG TERM GOAL #3   Title pt will be able to stand/ walk  > 30 minutes with < 3/10 pain  to assist wtih prlonged walking and standing activities with her job (06/18/2015)   Time 6   Period Weeks   Status New   PT LONG TERM GOAL #4   Title pt will be bale to navigate up/down > 12 steps with < 3/10 pain to assist with job requirments and community ambulation (06/18/2015)   Time 6   Period Weeks   Status New   PT LONG TERM GOAL #5   Title pt will increase her FOTO score to > 58 to demonstrate improved function at discharge (06/18/2015)   Time 6   Period Weeks   Status New               Plan - 05/29/15 0902    Clinical Impression Statement Review of HEP-pt is independent. SHe has been more sore since beginning HEP. Instructed pt in supine 4 way SLR with pt demonstrating hip weakness and difficulty completing 2 sets of 10 as well as minimal lift in hip extension bilateral. Pt with many questions about gym equipment and icing.    PT Next Visit Plan progress to closed chain as tolerated- 4 way hip with theraband, knee moblity, hip strengthening, balance training, Modalities PRN        Problem List Patient Active Problem List   Diagnosis Date Noted  . S/P hysterectomy 12/12/2013  . Leiomyoma of body of uterus 04/18/2012    Dorene Ar, PTA 05/29/2015, 9:07 AM  [place], [place], [address] Phone: [phone]   Fax:  [phone]

## 2015-05-29 NOTE — Patient Instructions (Signed)
Hip Extension (Prone)   Lift left leg __6__ inches from floor, keeping knee locked. Repeat __10__ times per set. Do _2-3___ sets per session. Do __2__ sessions per day.  http://orth.exer.us/98   Copyright  VHI. All rights reserved.  Hip Adduction: Leg Lift (Eccentric) - Side-Lying   Lie on side with top leg bent, foot flat behind lower leg. Quickly lift lower leg. Slowly lower for 3-5 seconds. __10_ reps per set, _2-3__ sets per session, 2 sessions per day Add ___ lbs when you achieve ___ repetitions.  Copyright  VHI. All rights reserved.  Abduction: Side Leg Lift (Eccentric) - Side-Lying   Lie on side. Lift top leg slightly higher than shoulder level. Keep top leg straight with body, toes pointing forward. Slowly lower for 3-5 seconds. _10__ reps per set, _2-3__ sets per day, __2 sessions per day.   Copyright  VHI. All rights reserved.

## 2015-06-02 ENCOUNTER — Ambulatory Visit: Payer: 59 | Admitting: Physical Therapy

## 2015-06-02 ENCOUNTER — Encounter: Payer: 59 | Admitting: Physical Therapy

## 2015-06-02 DIAGNOSIS — R269 Unspecified abnormalities of gait and mobility: Secondary | ICD-10-CM

## 2015-06-02 DIAGNOSIS — M25561 Pain in right knee: Secondary | ICD-10-CM | POA: Diagnosis not present

## 2015-06-02 DIAGNOSIS — M25562 Pain in left knee: Secondary | ICD-10-CM

## 2015-06-02 DIAGNOSIS — M25461 Effusion, right knee: Secondary | ICD-10-CM

## 2015-06-02 DIAGNOSIS — M24662 Ankylosis, left knee: Secondary | ICD-10-CM

## 2015-06-02 DIAGNOSIS — M24661 Ankylosis, right knee: Secondary | ICD-10-CM

## 2015-06-02 NOTE — Therapy (Signed)
[place], [place], [address] Phone: [phone]   Fax:  [phone]  Physical Therapy Treatment  Patient Details  Name: Erin Hall MRN: [id number] Date of Birth: [birth year] Referring Provider:  [name], MD  Encounter Date: 06/02/2015      PT End of Session - 06/02/15 1021    Visit Number 3   Number of Visits 12   Date for PT Re-Evaluation 06/18/15   PT Start Time 9532   PT Stop Time 1102   PT Time Calculation (min) 47 min      Past Medical History  Diagnosis Date  . Hypothyroidism   . Hyperlipidemia   . History of seasonal allergies     Past Surgical History  Procedure Laterality Date  . Breast reduction surgery    . Knee surgery    . Uterine artery embolization  04/17/2012  . Robotic assisted total hysterectomy N/A 12/12/2013    Procedure: ROBOTIC ASSISTED TOTAL HYSTERECTOMY;  Surgeon: Marvene Staff, MD;  Location: [facility];  Service: Gynecology;  Laterality: N/A;  . Bilateral salpingectomy N/A 12/12/2013    Procedure: BILATERAL SALPINGECTOMY;  Surgeon: Marvene Staff, MD;  Location: [facility];  Service: Gynecology;  Laterality: N/A;    There were no vitals filed for this visit.  Visit Diagnosis:  Decreased range of knee movement, right  Decreased range of motion of knee, left  Abnormality of gait  Knee swelling, right  Bilateral knee pain      Subjective Assessment - 06/02/15 1021    Subjective The right knee is hurting a little more, medial. The right is always hurting some. Left no pain now.    Currently in Pain? Yes   Pain Score 4    Pain Location Knee   Pain Orientation Right   Pain Descriptors / Indicators Aching  sometimes sharp    Pain Frequency Constant            [facility] PT Assessment - 06/02/15 1039    AROM   Right Knee Extension -2   Right Knee Flexion 121   Left Knee Extension -2   Left Knee Flexion 121                      [facility] Adult PT Treatment/Exercise - 06/02/15 1032    Knee/Hip Exercises: Stretches   Active Hamstring Stretch 3 reps;30 seconds   Knee: Self-Stretch to increase Flexion 3 reps;30 seconds   Knee: Self-Stretch Limitations prone with strap   Knee/Hip Exercises: Aerobic   Recumbent Bike L2 x 5 min   Knee/Hip Exercises: Machines for Strengthening   Cybex Knee Extension 1 plate 10x2   Cybex Knee Flexion 3 plates 10 x 2   Knee/Hip Exercises: Standing   Forward Step Up 20 reps;Hand Hold: 0;Step Height: 6"   Other Standing Knee Exercises standing 4 way    Knee/Hip Exercises: Supine   Bridges with Clamshell 10 reps  green band x 3   Modalities   Modalities Iontophoresis   Iontophoresis   Type of Iontophoresis Dexamethasone   Location right medial knee   Dose 1 ml   Time 6 hours                  PT Short Term Goals - 05/29/15 0906    PT SHORT TERM GOAL #1   Title pt will be I with inital HEP (05/28/2015)   Time 3   Period Weeks   Status Achieved  PT SHORT TERM GOAL #2   Title pt will be able to verbalize and demonstrate techniques to reduce bil knee swelling and pain via RICE and HEP (05/28/2015)   Time 3   Period Weeks   Status On-going           PT Long Term Goals - 06/02/15 1450    PT LONG TERM GOAL #1   Title pt will be I with all HEP given throughout therapy (06/18/2015)   Time 6   Period Weeks   Status On-going   PT LONG TERM GOAL #2   Title pt will increase bil knee AROM to > 120 - 0 degrees to assist with an efficent gait pattern (06/18/2015)   Time 6   Period Weeks   Status Partially Met   PT LONG TERM GOAL #3   Title pt will be able to stand/ walk > 30 minutes with < 3/10 pain  to assist wtih prlonged walking and standing activities with her job (06/18/2015)   Time 6   Period Weeks   Status On-going   PT LONG TERM GOAL #4   Title pt will be bale to navigate up/down > 12 steps with < 3/10 pain to assist with job requirments and community ambulation  (06/18/2015)   Time 6   Period Weeks   Status On-going   PT LONG TERM GOAL #5   Title pt will increase her FOTO score to > 58 to demonstrate improved function at discharge (06/18/2015)   Time 6   Period Weeks   Status On-going               Plan - 06/02/15 1420    Clinical Impression Statement Instructed pt in knee strengtheing with gym equipment and progressed hip strengthening to standing with theraband. Iontophoresis to right medial knee today.    PT Next Visit Plan continue ionto, knee and hip strength, ice PRN        Problem List Patient Active Problem List   Diagnosis Date Noted  . S/P hysterectomy 12/12/2013  . Leiomyoma of body of uterus 04/18/2012    Dorene Ar, PTA 06/02/2015, 2:51 PM  [facility] [address] [place], [place], [address] Phone: [phone]   Fax:  [phone]

## 2015-06-02 NOTE — Patient Instructions (Addendum)
  Bridge [employer] small of back into mat, maintain pelvic tilt, roll up one vertebrae at a time. Focus on engaging posterior hip muscles. Hold for _1___ breaths. Repeat _10___ times.  Copyright  VHI. All rights reserved.   Abductor Strength: Bridge Pose (Strap)   Make strap wide enough to brace knees at hip width. Pull strap open x 10   Repeat __3__ times.  Copyright  VHI. All rights reserved.     Bridging: with Straight Leg Raise   With legs bent, lift buttocks __12__ inches from floor. Then slowly extend right knee, keeping stomach tight. Alternate 10 times Repeat __3__ times per set. Do __1__ sets per session. Do _2___ sessions per day.  http://orth.exer.us/1104   Copyright  VHI. All rights reserved.   Knee High  USE THERABAND RED Holding stable object, raise knee to hip level, then lower knee. Repeat with other knee. Complete __10_ repetitions. Do __2__ sessions per day.  ABDUCTION: Standing (Active)   Stand, feet flat. Lift right leg out to side. Use _0__ lbs. Complete __10_ repetitions. Perform __2_ sessions per day.  ADDUCTION: Standing - Stable (Active)   Stand, right leg out to side as far as possible. Draw leg in across midline. Use _0__ lbs. Complete 10_ repetitions. Perform _2__ sessions per day.       EXTENSION: Standing (Active)  Stand, both feet flat. Draw right leg behind body as far as possible. Use 0___ lbs. Complete 10 repetitions. Perform __2_ sessions per day.  Copyright  VHI. All rights reserved.

## 2015-06-04 ENCOUNTER — Ambulatory Visit: Payer: 59 | Admitting: Physical Therapy

## 2015-06-04 DIAGNOSIS — M25461 Effusion, right knee: Secondary | ICD-10-CM

## 2015-06-04 DIAGNOSIS — M25561 Pain in right knee: Secondary | ICD-10-CM | POA: Diagnosis not present

## 2015-06-04 DIAGNOSIS — R269 Unspecified abnormalities of gait and mobility: Secondary | ICD-10-CM

## 2015-06-04 DIAGNOSIS — M24662 Ankylosis, left knee: Secondary | ICD-10-CM

## 2015-06-04 DIAGNOSIS — M24661 Ankylosis, right knee: Secondary | ICD-10-CM

## 2015-06-04 DIAGNOSIS — M25562 Pain in left knee: Secondary | ICD-10-CM

## 2015-06-04 NOTE — Therapy (Signed)
[place], [place], [address] Phone: [phone]   Fax:  [phone]  Physical Therapy Treatment  Patient Details  Name: Erin Hall MRN: [id number] Date of Birth: [birth year] Referring Provider:  [name], MD  Encounter Date: 06/04/2015      PT End of Session - 06/04/15 0857    Visit Number 4   Number of Visits 12   Date for PT Re-Evaluation 06/18/15   PT Start Time 0800   PT Stop Time 0845   PT Time Calculation (min) 45 min   Activity Tolerance Patient tolerated treatment well   Behavior During Therapy [facility] for tasks assessed/performed      Past Medical History  Diagnosis Date  . Hypothyroidism   . Hyperlipidemia   . History of seasonal allergies     Past Surgical History  Procedure Laterality Date  . Breast reduction surgery    . Knee surgery    . Uterine artery embolization  04/17/2012  . Robotic assisted total hysterectomy N/A 12/12/2013    Procedure: ROBOTIC ASSISTED TOTAL HYSTERECTOMY;  Surgeon: Marvene Staff, MD;  Location: [facility];  Service: Gynecology;  Laterality: N/A;  . Bilateral salpingectomy N/A 12/12/2013    Procedure: BILATERAL SALPINGECTOMY;  Surgeon: Marvene Staff, MD;  Location: [facility];  Service: Gynecology;  Laterality: N/A;    There were no vitals filed for this visit.  Visit Diagnosis:  Decreased range of knee movement, right  Decreased range of motion of knee, left  Abnormality of gait  Knee swelling, right  Bilateral knee pain      Subjective Assessment - 06/04/15 0813    Subjective "i am feeling more sore in the inside of my knee, and have been having more soreness at night"    Currently in Pain? Yes   Pain Score 4    Pain Location Knee   Pain Orientation Right   Pain Descriptors / Indicators Aching   Pain Type Chronic pain   Pain Onset More than a month ago   Pain Frequency Constant   Aggravating Factors  stairs, prolonged standing,  getting up from sitting   Pain Relieving Factors sitting, resting, celebrex                         [facility] Adult PT Treatment/Exercise - 06/04/15 0816    Knee/Hip Exercises: Stretches   Active Hamstring Stretch 2 reps;30 seconds   Quad Stretch 2 reps;30 seconds  prone   Other Knee/Hip Stretches butterfly stretch 2 x 30 sec   Knee/Hip Exercises: Aerobic   Recumbent Bike L 2 x 5 min   Knee/Hip Exercises: Machines for Strengthening   Cybex Knee Extension 1 plate 10x2   Cybex Knee Flexion 3 plates 10 x 2   Knee/Hip Exercises: Supine   Straight Leg Raises Both;AROM;2 sets;15 reps   Iontophoresis   Type of Iontophoresis Dexamethasone   Location right medial knee   Dose 1 ml   Time 6 hours   Manual Therapy   Manual Therapy Myofascial release;Soft tissue mobilization   Soft tissue mobilization attempted instrument assisted STM but pt didn't tolerate so halted use.  STM with hands which she reported some relief in the adductors, antegrade/retrograde massage for swelling reduction   Myofascial Release manual trigger point release of L adductors x 2 spots  reported some relief of tightness in the thigh.  PT Education - 06/04/15 0857    Education provided Yes   Education Details antegrade and retrograde massage with the knee elevated   Person(s) Educated Patient   Methods Explanation   Comprehension Verbalized understanding          PT Short Term Goals - 05/29/15 0906    PT SHORT TERM GOAL #1   Title pt will be I with inital HEP (05/28/2015)   Time 3   Period Weeks   Status Achieved   PT SHORT TERM GOAL #2   Title pt will be able to verbalize and demonstrate techniques to reduce bil knee swelling and pain via RICE and HEP (05/28/2015)   Time 3   Period Weeks   Status On-going           PT Long Term Goals - 06/02/15 1450    PT LONG TERM GOAL #1   Title pt will be I with all HEP given throughout therapy (06/18/2015)   Time 6    Period Weeks   Status On-going   PT LONG TERM GOAL #2   Title pt will increase bil knee AROM to > 120 - 0 degrees to assist with an efficent gait pattern (06/18/2015)   Time 6   Period Weeks   Status Partially Met   PT LONG TERM GOAL #3   Title pt will be able to stand/ walk > 30 minutes with < 3/10 pain  to assist wtih prlonged walking and standing activities with her job (06/18/2015)   Time 6   Period Weeks   Status On-going   PT LONG TERM GOAL #4   Title pt will be bale to navigate up/down > 12 steps with < 3/10 pain to assist with job requirments and community ambulation (06/18/2015)   Time 6   Period Weeks   Status On-going   PT LONG TERM GOAL #5   Title pt will increase her FOTO score to > 58 to demonstrate improved function at discharge (06/18/2015)   Time 6   Period Weeks   Status On-going               Plan - 06/04/15 0858    Clinical Impression Statement Erin Hall reports some incresaed soreness with constant swelling in the front of her knee and intemrittnelty in the back with reported tightness. Focused todays treatment on STM over the quads and adductors, attempted to used instrument assisted STM but she reported immediate tenderness over the adductors halted use of tool and was able to finish. discussed with pt antegrade/ retrograde massage for swelling which she said she would try to do at home, as well as ice which she states she hasn't done.    PT Next Visit Plan continue ionto, knee and hip strength, ice PRN, manual for quad/adductor tightness        Problem List Patient Active Problem List   Diagnosis Date Noted  . S/P hysterectomy 12/12/2013  . Leiomyoma of body of uterus 04/18/2012   Starr Lake PT, DPT, LAT, ATC  06/04/2015  9:12 AM      [facility] [address] [place], [place], [address] Phone: [phone]   Fax:  [phone]

## 2015-06-09 ENCOUNTER — Ambulatory Visit: Payer: 59 | Admitting: Physical Therapy

## 2015-06-09 DIAGNOSIS — R269 Unspecified abnormalities of gait and mobility: Secondary | ICD-10-CM

## 2015-06-09 DIAGNOSIS — M25561 Pain in right knee: Secondary | ICD-10-CM

## 2015-06-09 DIAGNOSIS — M25461 Effusion, right knee: Secondary | ICD-10-CM

## 2015-06-09 DIAGNOSIS — M25562 Pain in left knee: Secondary | ICD-10-CM

## 2015-06-09 DIAGNOSIS — M24662 Ankylosis, left knee: Secondary | ICD-10-CM

## 2015-06-09 DIAGNOSIS — M24661 Ankylosis, right knee: Secondary | ICD-10-CM

## 2015-06-09 NOTE — Therapy (Signed)
[place], [place], [address] Phone: [phone]   Fax:  [phone]  Physical Therapy Treatment  Patient Details  Name: Erin Hall MRN: [id number] Date of Birth: 1960-12-12 No Data Recorded  Encounter Date: 06/09/2015      PT End of Session - 06/09/15 0939    Visit Number 5   Number of Visits 12   Date for PT Re-Evaluation 06/18/15   PT Start Time 0845   PT Stop Time 0935   PT Time Calculation (min) 50 min   Activity Tolerance Patient tolerated treatment well   Behavior During Therapy [facility] for tasks assessed/performed      Past Medical History  Diagnosis Date  . Hypothyroidism   . Hyperlipidemia   . History of seasonal allergies     Past Surgical History  Procedure Laterality Date  . Breast reduction surgery    . Knee surgery    . Uterine artery embolization  04/17/2012  . Robotic assisted total hysterectomy N/A 12/12/2013    Procedure: ROBOTIC ASSISTED TOTAL HYSTERECTOMY;  Surgeon: Marvene Staff, MD;  Location: [facility] [place] [facility];  Service: Gynecology;  Laterality: N/A;  . Bilateral salpingectomy N/A 12/12/2013    Procedure: BILATERAL SALPINGECTOMY;  Surgeon: Marvene Staff, MD;  Location: [facility] [facility];  Service: Gynecology;  Laterality: N/A;    There were no vitals filed for this visit.  Visit Diagnosis:  Decreased range of knee movement, right  Decreased range of motion of knee, left  Abnormality of gait  Knee swelling, right  Bilateral knee pain      Subjective Assessment - 06/09/15 0854    Subjective "I was feeling sore after the last visit but it calm down some"   Currently in Pain? Yes   Pain Score 4    Pain Location Knee   Pain Orientation Right   Pain Descriptors / Indicators Aching   Pain Type Chronic pain   Pain Onset More than a month ago   Pain Frequency Constant                         [facility] Adult PT Treatment/Exercise - 06/09/15 0001    Knee/Hip  Exercises: Stretches   Active Hamstring Stretch 2 reps;30 seconds   Quad Stretch 2 reps;30 seconds   Other Knee/Hip Stretches butterfly stretch 2 x 30 sec   Knee/Hip Exercises: Aerobic   Recumbent Bike L 2 x 5 min   Knee/Hip Exercises: Machines for Strengthening   Cybex Knee Extension 1 plate 10x2   Cybex Knee Flexion 3 plates 10 x 2  down with both, up with RLE only   Hip Cybex hip extension 2 x 10 50# performed bil   Knee/Hip Exercises: Supine   Bridges with Clamshell 10 reps   Straight Leg Raises Both;AROM;2 sets;15 reps   Knee/Hip Exercises: Sidelying   Hip ABduction AROM;Strengthening;2 sets;15 reps   Modalities   Modalities Ultrasound   Ultrasound   Ultrasound Location Medial R knee   Ultrasound Parameters 1 w/cm2 x 8 min, 100%   Ultrasound Goals Pain   Iontophoresis   Type of Iontophoresis Dexamethasone   Location right medial knee   Dose 1 ml   Time 6 hours   Manual Therapy   Soft tissue mobilization instrument assisted STM over the R adductors                PT Education - 06/09/15 0939    Education provided Yes  Education Details ultrasound education and discussed exercise progression   Person(s) Educated Patient   Methods Explanation   Comprehension Verbalized understanding          PT Short Term Goals - 06/09/15 0941    PT SHORT TERM GOAL #1   Title pt will be I with inital HEP (05/28/2015)   Time 3   Period Weeks   Status Achieved   PT SHORT TERM GOAL #2   Title pt will be able to verbalize and demonstrate techniques to reduce bil knee swelling and pain via RICE and HEP (05/28/2015)   Time 3   Period Weeks   Status Achieved           PT Long Term Goals - 06/09/15 0941    PT LONG TERM GOAL #1   Title pt will be I with all HEP given throughout therapy (06/18/2015)   Time 6   Period Weeks   Status On-going   PT LONG TERM GOAL #2   Title pt will increase bil knee AROM to > 120 - 0 degrees to assist with an efficent gait pattern  (06/18/2015)   Time 6   Period Weeks   Status Partially Met   PT LONG TERM GOAL #3   Title pt will be able to stand/ walk > 30 minutes with < 3/10 pain  to assist wtih prlonged walking and standing activities with her job (06/18/2015)   Time 6   Period Weeks   Status On-going   PT LONG TERM GOAL #4   Title pt will be bale to navigate up/down > 12 steps with < 3/10 pain to assist with job requirments and community ambulation (06/18/2015)   Time 6   Period Weeks   Status On-going   PT LONG TERM GOAL #5   Title pt will increase her FOTO score to > 58 to demonstrate improved function at discharge (06/18/2015)   Time 6   Period Weeks   Status On-going               Plan - 06/09/15 0940    Clinical Impression Statement Erin Hall states she felt sore after the last session due to the manual TPR. Today she reported some sorness but can tell she is getting stronger. educated about ultrasound and performed on meidal aspect of the knee, which she stated it felt alright after. she was able to complete strengthening exercises without complaint.    PT Next Visit Plan assess response to [place], continue ionto, knee and hip strength, ice PRN, manual for quad/adductor tightness   PT Home Exercise Plan no new HEP given   Consulted and Agree with Plan of Care Patient        Problem List Patient Active Problem List   Diagnosis Date Noted  . S/P hysterectomy 12/12/2013  . Leiomyoma of body of uterus 04/18/2012   Erin Hall PT, DPT, LAT, ATC  06/09/2015  9:46 AM   [place], [place], [address] Phone: [phone]   Fax:  [phone]  Name: Erin Hall MRN: [id number] Date of Birth: [birth year]

## 2015-06-11 ENCOUNTER — Ambulatory Visit: Payer: 59 | Admitting: Physical Therapy

## 2015-06-11 DIAGNOSIS — R269 Unspecified abnormalities of gait and mobility: Secondary | ICD-10-CM

## 2015-06-11 DIAGNOSIS — M25562 Pain in left knee: Secondary | ICD-10-CM

## 2015-06-11 DIAGNOSIS — M25561 Pain in right knee: Secondary | ICD-10-CM | POA: Diagnosis not present

## 2015-06-11 DIAGNOSIS — M25461 Effusion, right knee: Secondary | ICD-10-CM

## 2015-06-11 DIAGNOSIS — M24662 Ankylosis, left knee: Secondary | ICD-10-CM

## 2015-06-11 DIAGNOSIS — M24661 Ankylosis, right knee: Secondary | ICD-10-CM

## 2015-06-11 NOTE — Therapy (Signed)
[place], [place], [address] Phone: [phone]   Fax:  [phone]  Physical Therapy Treatment  Patient Details  Name: Erin Hall MRN: [id number] Date of Birth: 06-06-1961 No Data Recorded  Encounter Date: 06/11/2015      PT End of Session - 06/11/15 0858    Visit Number 6   Number of Visits 12   Date for PT Re-Evaluation 06/18/15   PT Start Time 0810  pt arrived 10 minutes late   PT Stop Time 0852   PT Time Calculation (min) 42 min   Activity Tolerance Patient tolerated treatment well   Behavior During Therapy [facility] for tasks assessed/performed      Past Medical History  Diagnosis Date  . Hypothyroidism   . Hyperlipidemia   . History of seasonal allergies     Past Surgical History  Procedure Laterality Date  . Breast reduction surgery    . Knee surgery    . Uterine artery embolization  04/17/2012  . Robotic assisted total hysterectomy N/A 12/12/2013    Procedure: ROBOTIC ASSISTED TOTAL HYSTERECTOMY;  Surgeon: Marvene Staff, MD;  Location: [facility];  Service: Gynecology;  Laterality: N/A;  . Bilateral salpingectomy N/A 12/12/2013    Procedure: BILATERAL SALPINGECTOMY;  Surgeon: Marvene Staff, MD;  Location: [facility];  Service: Gynecology;  Laterality: N/A;    There were no vitals filed for this visit.  Visit Diagnosis:  Decreased range of knee movement, right  Decreased range of motion of knee, left  Abnormality of gait  Knee swelling, right  Bilateral knee pain      Subjective Assessment - 06/11/15 0809    Subjective "Im not feeling to bad this morining"    Currently in Pain? Yes   Pain Score 3    Pain Location Knee   Pain Orientation Right   Pain Descriptors / Indicators Aching   Pain Type Chronic pain   Pain Onset More than a month ago   Pain Frequency Constant   Aggravating Factors  stairs, prolonged standing, walking and getting up from sitting   Pain  Relieving Factors sitting, resting, celebrex                         [facility] Adult PT Treatment/Exercise - 06/11/15 0814    Knee/Hip Exercises: Stretches   Active Hamstring Stretch 2 reps;30 seconds   Quad Stretch 2 reps;30 seconds   Hip Flexor Stretch 2 reps;30 seconds   Other Knee/Hip Stretches butterfly stretch 2 x 30 sec   Knee/Hip Exercises: Aerobic   Elliptical L1 x 5 min  Level 3 incline   Knee/Hip Exercises: Machines for Strengthening   Cybex Knee Extension 1 plate 10x2   Cybex Knee Flexion 3 plates 10 x 2   Total Gym Leg Press 3 plates 2 x 10 abduction   VC for slow and controlled movement   Hip Cybex hip extension 2 x 10 50# performed bil   Knee/Hip Exercises: Supine   Bridges with Clamshell 10 reps   Straight Leg Raises Both;AROM;2 sets;15 reps   Ultrasound   Ultrasound Location R medial knee   Ultrasound Parameters 1.0 w/cm2 x 8 min 100%   Ultrasound Goals Pain   Iontophoresis   Type of Iontophoresis Dexamethasone   Location right medial knee   Dose 1 ml   Time 6 hours   Manual Therapy   Soft tissue mobilization instrument assisted STM over the R adductors  Myofascial Release manual trigger point release of L adductors x 2 spots                PT Education - 06/11/15 0858    Education provided Yes   Education Details discussed next visit being reveal   Person(s) Educated Patient   Methods Explanation   Comprehension Verbalized understanding          PT Short Term Goals - 06/09/15 0941    PT SHORT TERM GOAL #1   Title pt will be I with inital HEP (05/28/2015)   Time 3   Period Weeks   Status Achieved   PT SHORT TERM GOAL #2   Title pt will be able to verbalize and demonstrate techniques to reduce bil knee swelling and pain via RICE and HEP (05/28/2015)   Time 3   Period Weeks   Status Achieved           PT Long Term Goals - 06/09/15 0941    PT LONG TERM GOAL #1   Title pt will be I with all HEP given throughout therapy  (06/18/2015)   Time 6   Period Weeks   Status On-going   PT LONG TERM GOAL #2   Title pt will increase bil knee AROM to > 120 - 0 degrees to assist with an efficent gait pattern (06/18/2015)   Time 6   Period Weeks   Status Partially Met   PT LONG TERM GOAL #3   Title pt will be able to stand/ walk > 30 minutes with < 3/10 pain  to assist wtih prlonged walking and standing activities with her job (06/18/2015)   Time 6   Period Weeks   Status On-going   PT LONG TERM GOAL #4   Title pt will be bale to navigate up/down > 12 steps with < 3/10 pain to assist with job requirments and community ambulation (06/18/2015)   Time 6   Period Weeks   Status On-going   PT LONG TERM GOAL #5   Title pt will increase her FOTO score to > 58 to demonstrate improved function at discharge (06/18/2015)   Time 6   Period Weeks   Status On-going               Plan - 06/11/15 1025    Clinical Impression Statement Erin Hall reports having some pain yesterday but states she is doing better today. some soreness was noted during instrument assisted STM of the R adductors. She was able to complete the the exercises given today and with no report of pain or  difficulty. provided her with information for a personal trainer so she can work on strengthening following physical therapy.    PT Next Visit Plan assess response to [place], continue ionto, knee and hip strength, ice PRN, manual for quad/adductor tightness, Renewal, assess goals, FOTO   PT Home Exercise Plan HEP review   Consulted and Agree with Plan of Care Patient        Problem List Patient Active Problem List   Diagnosis Date Noted  . S/P hysterectomy 12/12/2013  . Leiomyoma of body of uterus 04/18/2012   Starr Lake PT, DPT, LAT, ATC  06/11/2015  10:28 AM      [facility] [address] [place], [place], [address] Phone: [phone]   Fax:  [phone]  Name: Erin Hall MRN:  [id number] Date of Birth: 10/06/1960

## 2015-06-17 ENCOUNTER — Ambulatory Visit: Payer: 59 | Admitting: Physical Therapy

## 2015-06-17 DIAGNOSIS — M25461 Effusion, right knee: Secondary | ICD-10-CM

## 2015-06-17 DIAGNOSIS — M24661 Ankylosis, right knee: Secondary | ICD-10-CM

## 2015-06-17 DIAGNOSIS — M25561 Pain in right knee: Secondary | ICD-10-CM | POA: Diagnosis not present

## 2015-06-17 DIAGNOSIS — M25562 Pain in left knee: Secondary | ICD-10-CM

## 2015-06-17 DIAGNOSIS — R269 Unspecified abnormalities of gait and mobility: Secondary | ICD-10-CM

## 2015-06-17 DIAGNOSIS — M24662 Ankylosis, left knee: Secondary | ICD-10-CM

## 2015-06-17 NOTE — Therapy (Addendum)
[place], [place], [address] Phone: [phone]   Fax:  [phone]  Physical Therapy Treatment / Re certification  Patient Details  Name: Erin Hall MRN: [id number] Date of Birth: 01-16-1961 No Data Recorded  Encounter Date: 06/17/2015      PT End of Session - 06/17/15 1114    Visit Number 7   Number of Visits 12   Date for PT Re-Evaluation 07/15/15   PT Start Time 0810  pt arrived 10 minutes late   PT Stop Time 0850   PT Time Calculation (min) 40 min   Activity Tolerance Patient tolerated treatment well   Behavior During Therapy [facility] for tasks assessed/performed      Past Medical History  Diagnosis Date  . Hypothyroidism   . Hyperlipidemia   . History of seasonal allergies     Past Surgical History  Procedure Laterality Date  . Breast reduction surgery    . Knee surgery    . Uterine artery embolization  04/17/2012  . Robotic assisted total hysterectomy N/A 12/12/2013    Procedure: ROBOTIC ASSISTED TOTAL HYSTERECTOMY;  Surgeon: Marvene Staff, MD;  Location: [facility];  Service: Gynecology;  Laterality: N/A;  . Bilateral salpingectomy N/A 12/12/2013    Procedure: BILATERAL SALPINGECTOMY;  Surgeon: Marvene Staff, MD;  Location: [facility];  Service: Gynecology;  Laterality: N/A;    There were no vitals filed for this visit.  Visit Diagnosis:  Decreased range of knee movement, right - Plan: PT plan of care cert/re-cert  Decreased range of motion of knee, left - Plan: PT plan of care cert/re-cert  Abnormality of gait - Plan: PT plan of care cert/re-cert  Knee swelling, right - Plan: PT plan of care cert/re-cert  Bilateral knee pain - Plan: PT plan of care cert/re-cert      Subjective Assessment - 06/17/15 0811    Subjective "I am doing well this morning, I've been doing more exercise at the gym" The other night pt reports that she had a bad cramp on the inside of my R thigh that  took her breath.    Currently in Pain? Yes   Pain Score 2    Pain Location Knee   Pain Orientation Right   Pain Descriptors / Indicators Aching   Pain Type Chronic pain   Pain Onset More than a month ago   Pain Frequency Constant   Aggravating Factors  stairs, prolonged standing, walking   Pain Relieving Factors sitting, resting, celebrex            [facility] PT Assessment - 06/17/15 0825    Observation/Other Assessments   Focus on Therapeutic Outcomes (FOTO)  64% limited   AROM   Right Knee Extension -2   Right Knee Flexion 122   Left Knee Extension -2   Left Knee Flexion 121   Strength   Right Knee Flexion 4+/5  pain noted during testing   Right Knee Extension 4+/5  pain noted during testing   Left Knee Flexion 5/5   Left Knee Extension 5/5                     [facility] Adult PT Treatment/Exercise - 06/17/15 0820    Knee/Hip Exercises: Aerobic   Elliptical L1 x 5 min   Knee/Hip Exercises: Machines for Strengthening   Cybex Knee Extension 1 plate 10x2   Cybex Knee Flexion 3 plates 10 x 2   Total Gym Leg Press 3 plates  2 x 10 abduction    Hip Cybex hip extension 2 x 10 50# performed bil   Knee/Hip Exercises: Standing   Wall Squat 1 set;5 reps                PT Education - 06/17/15 1114    Education provided Yes   Education Details updated POC to 1 x a week and progress to home exercises   Person(s) Educated Patient   Methods Explanation   Comprehension Verbalized understanding          PT Short Term Goals - 06/09/15 0941    PT SHORT TERM GOAL #1   Title pt will be I with inital HEP (05/28/2015)   Time 3   Period Weeks   Status Achieved   PT SHORT TERM GOAL #2   Title pt will be able to verbalize and demonstrate techniques to reduce bil knee swelling and pain via RICE and HEP (05/28/2015)   Time 3   Period Weeks   Status Achieved           PT Long Term Goals - 06/17/15 1118    PT LONG TERM GOAL #1   Title pt will be I with all HEP  given throughout therapy (06/18/2015)   Time 6   Period Weeks   Status On-going   PT LONG TERM GOAL #2   Title pt will increase bil knee AROM to > 120 - 0 degrees to assist with an efficent gait pattern (06/18/2015)   Time 6   Period Weeks   Status Partially Met   PT LONG TERM GOAL #3   Title pt will be able to stand/ walk > 30 minutes with < 3/10 pain  to assist wtih prlonged walking and standing activities with her job (06/18/2015)   Time 6   Period Weeks   Status On-going   PT LONG TERM GOAL #4   Title pt will be bale to navigate up/down > 12 steps with < 3/10 pain to assist with job requirments and community ambulation (06/18/2015)   Time 6   Period Weeks   Status On-going   PT LONG TERM GOAL #5   Title pt will increase her FOTO score to > 58 to demonstrate improved function at discharge (06/18/2015)   Time 6   Period Weeks   Status On-going               Plan - 06/17/15 1115    Clinical Impression Statement Erin Hall states she feels like the therapy is helping because she can tell she is getting stronger and the pain hasn't been as intense as it has been. Attempted mcConnel taping to assess if it would benefit the knee. She has demonstated increased strength and decreased muscle tenderness but continues to report stifness in the knee. Discussed with pt about strengthening at the gym will help but need to give some rest days to allow for healing. plan to continue with current POC 1 x week to progrss to independent exercise.    Pt will benefit from skilled therapeutic intervention in order to improve on the following deficits Hypomobility;Pain;Postural dysfunction;Improper body mechanics;Decreased endurance;Decreased range of motion;Decreased balance;Decreased activity tolerance;Decreased mobility;Increased muscle spasms;Increased edema   Rehab Potential Good   PT Frequency 1x / week   PT Duration 4 weeks   PT Treatment/Interventions ADLs/Self Care Home  Management;Cryotherapy;Electrical Stimulation;Iontophoresis 79m/ml Dexamethasone;Moist Heat;Ultrasound;Therapeutic activities;Therapeutic exercise;Manual techniques;Taping;Vasopneumatic Device;Dry needling;Passive range of motion;Patient/family education   PT Next Visit Plan knee and hip strength,  ice PRN, manual for quad/adductor tightness   PT Home Exercise Plan HEP review, and home exercise progression   Consulted and Agree with Plan of Care Patient        Problem List Patient Active Problem List   Diagnosis Date Noted  . S/P hysterectomy 12/12/2013  . Leiomyoma of body of uterus 04/18/2012   Starr Lake PT, DPT, LAT, ATC  06/17/2015  11:22 AM   [facility] [address] [place], [place], [address] Phone: [phone]   Fax:  [phone]  Name: Erin Hall MRN: [id number] Date of Birth: 05/08/1961

## 2015-06-19 ENCOUNTER — Encounter: Payer: 59 | Admitting: Physical Therapy

## 2015-06-24 ENCOUNTER — Ambulatory Visit: Payer: 59 | Attending: Family Medicine | Admitting: Physical Therapy

## 2015-06-24 DIAGNOSIS — M25562 Pain in left knee: Secondary | ICD-10-CM | POA: Insufficient documentation

## 2015-06-24 DIAGNOSIS — M24662 Ankylosis, left knee: Secondary | ICD-10-CM | POA: Diagnosis present

## 2015-06-24 DIAGNOSIS — M25561 Pain in right knee: Secondary | ICD-10-CM | POA: Insufficient documentation

## 2015-06-24 DIAGNOSIS — M24661 Ankylosis, right knee: Secondary | ICD-10-CM | POA: Diagnosis present

## 2015-06-24 DIAGNOSIS — R269 Unspecified abnormalities of gait and mobility: Secondary | ICD-10-CM | POA: Insufficient documentation

## 2015-06-24 DIAGNOSIS — M25461 Effusion, right knee: Secondary | ICD-10-CM | POA: Diagnosis present

## 2015-06-24 NOTE — Therapy (Signed)
[place], [place], [address] Phone: [phone]   Fax:  [phone]  Physical Therapy Treatment  Patient Details  Name: Erin Hall MRN: [id number] Date of Birth: [birth year] No Data Recorded  Encounter Date: 06/24/2015      PT End of Session - 06/24/15 1044    Visit Number 8   Number of Visits 12   Date for PT Re-Evaluation 07/15/15   PT Start Time 0810  pt arrived 10 minutes late   PT Stop Time 0850   PT Time Calculation (min) 40 min   Activity Tolerance Patient tolerated treatment well   Behavior During Therapy [facility] for tasks assessed/performed      Past Medical History  Diagnosis Date  . Hypothyroidism   . Hyperlipidemia   . History of seasonal allergies     Past Surgical History  Procedure Laterality Date  . Breast reduction surgery    . Knee surgery    . Uterine artery embolization  04/17/2012  . Robotic assisted total hysterectomy N/A 12/12/2013    Procedure: ROBOTIC ASSISTED TOTAL HYSTERECTOMY;  Surgeon: Marvene Staff, MD;  Location: [facility];  Service: Gynecology;  Laterality: N/A;  . Bilateral salpingectomy N/A 12/12/2013    Procedure: BILATERAL SALPINGECTOMY;  Surgeon: Marvene Staff, MD;  Location: [facility];  Service: Gynecology;  Laterality: N/A;    There were no vitals filed for this visit.  Visit Diagnosis:  Decreased range of knee movement, right  Decreased range of motion of knee, left  Abnormality of gait  Knee swelling, right  Bilateral knee pain      Subjective Assessment - 06/24/15 0815    Subjective "I did alot of walking the other day and the inside of my knee got swollen but otherwise the knee has felt pretty good"    Currently in Pain? Yes   Pain Score 3    Pain Location Knee   Pain Orientation Right   Pain Descriptors / Indicators Aching   Pain Type Chronic pain   Pain Onset More than a month ago   Pain Frequency Constant   Aggravating  Factors  stairs, prolonged standing, walking   Pain Relieving Factors sitting, resting, celebrex                         [facility] Adult PT Treatment/Exercise - 06/24/15 0817    Knee/Hip Exercises: Stretches   Active Hamstring Stretch 2 reps;30 seconds   Quad Stretch 2 reps;30 seconds   Hip Flexor Stretch 2 reps;30 seconds   Other Knee/Hip Stretches butterfly stretch 2 x 30 sec  pnf contract relax x 10 sec hold   Knee/Hip Exercises: Aerobic   Elliptical L1 x 5 min   Knee/Hip Exercises: Machines for Strengthening   Cybex Knee Extension 1 plate x 10 up with both down with RLE, 2 plates x 10 with bil LE   Knee/Hip Exercises: Supine   Bridges with Clamshell 10 reps;2 sets  with feet on physioball, 1 set with alt kickouts   Single Leg Bridge 1 set;10 reps   Straight Leg Raises Both;AROM;2 sets;15 reps   Other Supine Knee/Hip Exercises hamstring curls x 10   with feet on ball rolling toward bottom                 PT Education - 06/24/15 1044    Education provided Yes   Education Details educated about KT tape for swelling    Person(s)  Educated Patient   Methods Explanation   Comprehension Verbalized understanding          PT Short Term Goals - 06/09/15 0941    PT SHORT TERM GOAL #1   Title pt will be I with inital HEP (05/28/2015)   Time 3   Period Weeks   Status Achieved   PT SHORT TERM GOAL #2   Title pt will be able to verbalize and demonstrate techniques to reduce bil knee swelling and pain via RICE and HEP (05/28/2015)   Time 3   Period Weeks   Status Achieved           PT Long Term Goals - 06/17/15 1118    PT LONG TERM GOAL #1   Title pt will be I with all HEP given throughout therapy (06/18/2015)   Time 6   Period Weeks   Status On-going   PT LONG TERM GOAL #2   Title pt will increase bil knee AROM to > 120 - 0 degrees to assist with an efficent gait pattern (06/18/2015)   Time 6   Period Weeks   Status Partially Met   PT LONG TERM  GOAL #3   Title pt will be able to stand/ walk > 30 minutes with < 3/10 pain  to assist wtih prlonged walking and standing activities with her job (06/18/2015)   Time 6   Period Weeks   Status On-going   PT LONG TERM GOAL #4   Title pt will be bale to navigate up/down > 12 steps with < 3/10 pain to assist with job requirments and community ambulation (06/18/2015)   Time 6   Period Weeks   Status On-going   PT LONG TERM GOAL #5   Title pt will increase her FOTO score to > 58 to demonstrate improved function at discharge (06/18/2015)   Time 6   Period Weeks   Status On-going               Plan - 06/24/15 1044    Clinical Impression Statement Aliena reported about the same soreness in the knees today with increased fluctuating swelling limited her mobility. Focused todays treatment on strengthening of the knee and hip in combination with edema reduction massage. she was able to finish all exercises with report of only some musle burning that was fleeting following todays session . Educated about KT tape but pt wasn't interested in trying it. She reports she may call the docotor to set up an appointment for further evaluation.    PT Next Visit Plan knee and hip strength, ice PRN, manual for quad/adductor tightness   PT Home Exercise Plan no new HEP given   Consulted and Agree with Plan of Care Patient        Problem List Patient Active Problem List   Diagnosis Date Noted  . S/P hysterectomy 12/12/2013  . Leiomyoma of body of uterus 04/18/2012   Starr Lake PT, DPT, LAT, ATC  06/24/2015  10:49 AM    [facility] [address] [place], [place], [address] Phone: [phone]   Fax:  [phone]  Name: Cayce Paschal MRN: [id number] Date of Birth: 1961/06/01

## 2015-06-30 ENCOUNTER — Encounter: Payer: 59 | Admitting: Physical Therapy

## 2015-07-01 ENCOUNTER — Ambulatory Visit: Payer: 59 | Admitting: Physical Therapy

## 2015-07-01 DIAGNOSIS — M25562 Pain in left knee: Secondary | ICD-10-CM

## 2015-07-01 DIAGNOSIS — M24661 Ankylosis, right knee: Secondary | ICD-10-CM

## 2015-07-01 DIAGNOSIS — M24662 Ankylosis, left knee: Secondary | ICD-10-CM

## 2015-07-01 DIAGNOSIS — M25461 Effusion, right knee: Secondary | ICD-10-CM

## 2015-07-01 DIAGNOSIS — R269 Unspecified abnormalities of gait and mobility: Secondary | ICD-10-CM

## 2015-07-01 DIAGNOSIS — M25561 Pain in right knee: Secondary | ICD-10-CM

## 2015-07-01 NOTE — Therapy (Addendum)
[place], [place], [address] Phone: [phone]   Fax:  [phone]  Physical Therapy Treatment  Patient Details  Name: Riata Ikeda MRN: [id number] Date of Birth: [birth year] No Data Recorded  Encounter Date: 07/01/2015      PT End of Session - 07/01/15 0847    Visit Number 9   Number of Visits 12   Date for PT Re-Evaluation 07/15/15   PT Start Time 0820  pt arrived 20 minutes late   PT Stop Time 0855   PT Time Calculation (min) 35 min   Activity Tolerance Patient tolerated treatment well   Behavior During Therapy [facility] for tasks assessed/performed      Past Medical History  Diagnosis Date  . Hypothyroidism   . Hyperlipidemia   . History of seasonal allergies     Past Surgical History  Procedure Laterality Date  . Breast reduction surgery    . Knee surgery    . Uterine artery embolization  04/17/2012  . Robotic assisted total hysterectomy N/A 12/12/2013    Procedure: ROBOTIC ASSISTED TOTAL HYSTERECTOMY;  Surgeon: Marvene Staff, MD;  Location: [facility];  Service: Gynecology;  Laterality: N/A;  . Bilateral salpingectomy N/A 12/12/2013    Procedure: BILATERAL SALPINGECTOMY;  Surgeon: Marvene Staff, MD;  Location: [facility];  Service: Gynecology;  Laterality: N/A;    There were no vitals filed for this visit.  Visit Diagnosis:  Decreased range of knee movement, right  Decreased range of motion of knee, left  Abnormality of gait  Knee swelling, right  Bilateral knee pain      Subjective Assessment - 07/01/15 0821    Subjective "I had some pain the other day and couldn't bend my knee past 45 degrees"  pt reports she spoke with her physician and plans to get athroscopic surgery at the end of December   Currently in Pain? Yes   Pain Score 4    Pain Location Knee   Pain Orientation Right   Pain Descriptors / Indicators Aching   Pain Type Chronic pain   Pain Onset More than a  month ago   Aggravating Factors  stairs, prolonged standing, walking   Pain Relieving Factors sitting, resting, celebrex                         [facility] Adult PT Treatment/Exercise - 07/01/15 0825    Knee/Hip Exercises: Aerobic   Elliptical L1 x 6 min   Knee/Hip Exercises: Machines for Strengthening   Cybex Knee Extension 1 plate x 10 up with both down with RLE, 2 plates x 10 with bil LE   Cybex Knee Flexion 3 plates 10 x 2   Total Gym Leg Press 2 plates 3 x 10 abduction   with ball squeeze to keep knees over toes   Hip Cybex hip extension 2 x 10 50# performed bil   Knee/Hip Exercises: Standing   Step Down 2 sets;15 reps;Step Height: 4"   Knee/Hip Exercises: Supine   Bridges with Clamshell AROM;Strengthening;Both;2 sets;15 reps  with blue theraband   Cryotherapy   Number Minutes Cryotherapy 10 Minutes   Cryotherapy Location Knee  right   Type of Cryotherapy Ice pack  in supine with knee elevated                PT Education - 07/01/15 0847    Education provided Yes   Education Details review of HEP   Person(s) Educated  Patient   Methods Explanation   Comprehension Verbalized understanding          PT Short Term Goals - 06/09/15 0941    PT SHORT TERM GOAL #1   Title pt will be I with inital HEP (05/28/2015)   Time 3   Period Weeks   Status Achieved   PT SHORT TERM GOAL #2   Title pt will be able to verbalize and demonstrate techniques to reduce bil knee swelling and pain via RICE and HEP (05/28/2015)   Time 3   Period Weeks   Status Achieved           PT Long Term Goals - 07/01/15 0850    PT LONG TERM GOAL #1   Title pt will be I with all HEP given throughout therapy (06/18/2015)   Time 6   Period Weeks   Status On-going   PT LONG TERM GOAL #2   Title pt will increase bil knee AROM to > 120 - 0 degrees to assist with an efficent gait pattern (06/18/2015)   Time 6   Period Weeks   Status Partially Met   PT LONG TERM GOAL #3   Title  pt will be able to stand/ walk > 30 minutes with < 3/10 pain  to assist wtih prlonged walking and standing activities with her job (06/18/2015)   Time 6   Period Weeks   Status On-going   PT LONG TERM GOAL #4   Title pt will be bale to navigate up/down > 12 steps with < 3/10 pain to assist with job requirments and community ambulation (06/18/2015)   Time 6   Period Weeks   Status On-going   PT LONG TERM GOAL #5   Title pt will increase her FOTO score to > 58 to demonstrate improved function at discharge (06/18/2015)   Time 6   Period Weeks   Status On-going               Plan - 07/01/15 0848    Clinical Impression Statement pt arrived 20 minutes late to therapy today. She reports talking to her physicain and plans to get arthroscopic survery at the End of December. She was able to perform and complete all exercises given with mild report of soreness in the knee. pt opted for ice following todays session to decreaesd pain. plan to continue with strengthening for prehabilitation purposes.   PT Next Visit Plan knee and hip strength, ice PRN, manual for quad/adductor tightness   PT Home Exercise Plan HEP review.    Consulted and Agree with Plan of Care Patient        Problem List Patient Active Problem List   Diagnosis Date Noted  . S/P hysterectomy 12/12/2013  . Leiomyoma of body of uterus 04/18/2012   Starr Lake PT, DPT, LAT, ATC  07/01/2015  8:55 AM    [facility] [address] [place], [place], [address] Phone: [phone]   Fax:  [phone]  Name: Shanta Hartner MRN: [id number] Date of Birth: Nov 16, 1960    PHYSICAL THERAPY DISCHARGE SUMMARY  Visits from Start of Care: 9  Current functional level related to goals / functional outcomes: See goals   Remaining deficits: Unknown due to not returning since last attended visit.      Education / Equipment: HEP, Theraband  Plan:  Patient goals were partially met. Patient is being discharged due to not returning since the last visit.  ?????        Madhavi Hamblen PT, DPT, LAT, ATC  08/12/2015  12:56 PM

## 2015-07-02 ENCOUNTER — Encounter: Payer: 59 | Admitting: Physical Therapy

## 2015-07-07 ENCOUNTER — Encounter: Payer: 59 | Admitting: Physical Therapy

## 2015-07-08 ENCOUNTER — Ambulatory Visit: Payer: 59 | Admitting: Physical Therapy

## 2015-07-09 ENCOUNTER — Encounter: Payer: 59 | Admitting: Physical Therapy

## 2015-07-14 ENCOUNTER — Encounter: Payer: 59 | Admitting: Physical Therapy

## 2015-07-15 ENCOUNTER — Encounter: Payer: 59 | Admitting: Physical Therapy

## 2015-07-22 ENCOUNTER — Telehealth: Payer: Self-pay | Admitting: Physical Therapy

## 2015-07-22 ENCOUNTER — Ambulatory Visit: Payer: 59 | Admitting: Physical Therapy

## 2015-07-22 NOTE — Telephone Encounter (Signed)
LVM that she missed her appointment today and it was her last scheduled visit. If she feels she still needs physical therapy to call back and get scheduled or we can discharge from physical therapy if she feels she doesn't need any more.

## 2015-08-11 NOTE — Patient Instructions (Signed)
Erin Hall  08/11/2015   Your procedure is scheduled on: 08-20-15  Report to [facility]  Entrance take [facility]  elevators to 3rd floor to  [place] at 1120 AM.  Call this number if you have problems the morning of surgery [phone]   Remember: ONLY 1 PERSON MAY GO WITH YOU TO SHORT STAY TO GET  READY MORNING OF [place].  Do not eat food  :After Midnight., clear liquids midnight until 720 am day of surgery, nothing by mouth after 720 am day of surgery.    Take these medicines the morning of surgery with A SIP OF WATER: Levothyroxine (Synthroid)                               You may not have any metal on your body including hair pins and              piercings  Do not wear jewelry, make-up, lotions, powders or perfumes, deodorant             Do not wear nail polish.  Do not shave  48 hours prior to surgery.              Men may shave face and neck.   Do not bring valuables to the hospital. [place].  Contacts, dentures or bridgework may not be worn into surgery.  Leave suitcase in the car. After surgery it may be brought to your room.     Patients discharged the day of surgery will not be allowed to drive home.  Name and phone number of your driver:  Special Instructions: N/A              Please read over the following fact sheets you were given: _____________________________________________________________________             [facility] - Preparing for Surgery Before surgery, you can play an important role.  Because skin is not sterile, your skin needs to be as free of germs as possible.  You can reduce the number of germs on your skin by washing with CHG (chlorahexidine gluconate) soap before surgery.  CHG is an antiseptic cleaner which kills germs and bonds with the skin to continue killing germs even after washing. Please DO NOT use if you have an allergy to CHG or antibacterial  soaps.  If your skin becomes reddened/irritated stop using the CHG and inform your nurse when you arrive at Short Stay. Do not shave (including legs and underarms) for at least 48 hours prior to the first CHG shower.  You may shave your face/neck. Please follow these instructions carefully:  1.  Shower with CHG Soap the night before surgery and the  morning of Surgery.  2.  If you choose to wash your hair, wash your hair first as usual with your  normal  shampoo.  3.  After you shampoo, rinse your hair and body thoroughly to remove the  shampoo.                           4.  Use CHG as you would any other liquid soap.  You can apply chg directly  to the  skin and wash                       Gently with a scrungie or clean washcloth.  5.  Apply the CHG Soap to your body ONLY FROM THE NECK DOWN.   Do not use on face/ open                           Wound or open sores. Avoid contact with eyes, ears mouth and genitals (private parts).                       Wash face,  Genitals (private parts) with your normal soap.             6.  Wash thoroughly, paying special attention to the area where your surgery  will be performed.  7.  Thoroughly rinse your body with warm water from the neck down.  8.  DO NOT shower/wash with your normal soap after using and rinsing off  the CHG Soap.                9.  Pat yourself dry with a clean towel.            10.  Wear clean pajamas.            11.  Place clean sheets on your bed the night of your first shower and do not  sleep with pets. Day of Surgery : Do not apply any lotions/deodorants the morning of surgery.  Please wear clean clothes to the hospital/surgery center.  FAILURE TO FOLLOW THESE INSTRUCTIONS MAY RESULT IN THE CANCELLATION OF YOUR SURGERY PATIENT SIGNATURE_________________________________  NURSE SIGNATURE__________________________________  ________________________________________________________________________    CLEAR LIQUID DIET   Foods  Allowed                                                                     Foods Excluded  Coffee and tea, regular and decaf                             liquids that you cannot  Plain Jell-O in any flavor                                             see through such as: Fruit ices (not with fruit pulp)                                     milk, soups, orange juice  Iced Popsicles                                    All solid food Carbonated beverages, regular and diet  Cranberry, grape and apple juices Sports drinks like Gatorade Lightly seasoned clear broth or consume(fat free) Sugar, honey syrup  Sample Menu Breakfast                                Lunch                                     Supper Cranberry juice                    Beef broth                            Chicken broth Jell-O                                     Grape juice                           Apple juice Coffee or tea                        Jell-O                                      Popsicle                                                Coffee or tea                        Coffee or tea  _____________________________________________________________________    Incentive Spirometer  An incentive spirometer is a tool that can help keep your lungs clear and active. This tool measures how well you are filling your lungs with each breath. Taking long deep breaths may help reverse or decrease the chance of developing breathing (pulmonary) problems (especially infection) following:  A long period of time when you are unable to move or be active. BEFORE THE PROCEDURE   If the spirometer includes an indicator to show your best effort, your nurse or respiratory therapist will set it to a desired goal.  If possible, sit up straight or lean slightly forward. Try not to slouch.  Hold the incentive spirometer in an upright position. INSTRUCTIONS FOR USE  1. Sit on the edge of your bed if  possible, or sit up as far as you can in bed or on a chair. 2. Hold the incentive spirometer in an upright position. 3. Breathe out normally. 4. Place the mouthpiece in your mouth and seal your lips tightly around it. 5. Breathe in slowly and as deeply as possible, raising the piston or the ball toward the top of the column. 6. Hold your breath for 3-5 seconds or for as long as possible. Allow the piston or ball to fall to the bottom of the column. 7. Remove the mouthpiece from your mouth and breathe out normally. 8. Rest for a few seconds and repeat Steps 1 through 7 at  least 10 times every 1-2 hours when you are awake. Take your time and take a few normal breaths between deep breaths. 9. The spirometer may include an indicator to show your best effort. Use the indicator as a goal to work toward during each repetition. 10. After each set of 10 deep breaths, practice coughing to be sure your lungs are clear. If you have an incision (the cut made at the time of surgery), support your incision when coughing by placing a pillow or rolled up towels firmly against it. Once you are able to get out of bed, walk around indoors and cough well. You may stop using the incentive spirometer when instructed by your caregiver.  RISKS AND COMPLICATIONS  Take your time so you do not get dizzy or light-headed.  If you are in pain, you may need to take or ask for pain medication before doing incentive spirometry. It is harder to take a deep breath if you are having pain. AFTER USE  Rest and breathe slowly and easily.  It can be helpful to keep track of a log of your progress. Your caregiver can provide you with a simple table to help with this. If you are using the spirometer at home, follow these instructions: [place] IF:   You are having difficultly using the spirometer.  You have trouble using the spirometer as often as instructed.  Your pain medication is not giving enough relief while using  the spirometer.  You develop fever of 100.5 F (38.1 C) or higher. SEEK IMMEDIATE MEDICAL CARE IF:   You cough up bloody sputum that had not been present before.  You develop fever of 102 F (38.9 C) or greater.  You develop worsening pain at or near the incision site. MAKE SURE YOU:   Understand these instructions.  Will watch your condition.  Will get help right away if you are not doing well or get worse. Document Released: 12/19/2006 Document Revised: 10/31/2011 Document Reviewed: 02/19/2007 [facility] Patient Information 2014 [place], [place].   ________________________________________________________________________

## 2015-08-11 NOTE — Progress Notes (Signed)
Consent order in epic says right knee arthroplasty and debridement, surgery is right knee arthroscopy and debridement, please fix consent order in epic. Pre op is 08-18-15 900 am, thanks

## 2015-08-18 ENCOUNTER — Encounter (HOSPITAL_COMMUNITY): Payer: Self-pay | Admitting: *Deleted

## 2015-08-18 ENCOUNTER — Encounter (HOSPITAL_COMMUNITY): Payer: Self-pay

## 2015-08-18 ENCOUNTER — Encounter (HOSPITAL_COMMUNITY)
Admission: RE | Admit: 2015-08-18 | Discharge: 2015-08-18 | Disposition: A | Discharge status: Home / Self Care | Payer: 59 | Source: Ambulatory Visit | Attending: Orthopedic Surgery | Admitting: Orthopedic Surgery

## 2015-08-18 DIAGNOSIS — E039 Hypothyroidism, unspecified: Secondary | ICD-10-CM | POA: Insufficient documentation

## 2015-08-18 DIAGNOSIS — Z9079 Acquired absence of other genital organ(s): Secondary | ICD-10-CM | POA: Diagnosis not present

## 2015-08-18 DIAGNOSIS — E785 Hyperlipidemia, unspecified: Secondary | ICD-10-CM | POA: Diagnosis not present

## 2015-08-18 DIAGNOSIS — M7071 Other bursitis of hip, right hip: Secondary | ICD-10-CM | POA: Diagnosis not present

## 2015-08-18 DIAGNOSIS — Z90710 Acquired absence of both cervix and uterus: Secondary | ICD-10-CM | POA: Insufficient documentation

## 2015-08-18 DIAGNOSIS — S83281A Other tear of lateral meniscus, current injury, right knee, initial encounter: Secondary | ICD-10-CM | POA: Diagnosis not present

## 2015-08-18 DIAGNOSIS — Z79899 Other long term (current) drug therapy: Secondary | ICD-10-CM | POA: Diagnosis not present

## 2015-08-18 DIAGNOSIS — M7072 Other bursitis of hip, left hip: Secondary | ICD-10-CM | POA: Insufficient documentation

## 2015-08-18 DIAGNOSIS — S83241A Other tear of medial meniscus, current injury, right knee, initial encounter: Secondary | ICD-10-CM | POA: Diagnosis not present

## 2015-08-18 DIAGNOSIS — M94261 Chondromalacia, right knee: Secondary | ICD-10-CM | POA: Insufficient documentation

## 2015-08-18 DIAGNOSIS — M7062 Trochanteric bursitis, left hip: Secondary | ICD-10-CM | POA: Diagnosis present

## 2015-08-18 LAB — CBC
HCT: 42 % (ref 36.0–46.0)
Hemoglobin: 14.1 g/dL (ref 12.0–15.0)
MCH: 29.4 pg (ref 26.0–34.0)
MCHC: 33.6 g/dL (ref 30.0–36.0)
MCV: 87.5 fL (ref 78.0–100.0)
PLATELETS: 305 10*3/uL (ref 150–400)
RBC: 4.8 MIL/uL (ref 3.87–5.11)
RDW: 12.9 % (ref 11.5–15.5)
WBC: 5.3 10*3/uL (ref 4.0–10.5)

## 2015-08-18 NOTE — Progress Notes (Signed)
NPO AFTER MN.  ARRIVE AT [id number]. CURRENT CBC RESULT IN CHART AND EPIC.  WILL TAKE SYNTHROID AM DOS W/ SIPS OF WATER .

## 2015-08-20 ENCOUNTER — Encounter (HOSPITAL_COMMUNITY)
Admission: RE | Disposition: A | Discharge status: Home / Self Care | Payer: Self-pay | Source: Ambulatory Visit | Attending: Orthopedic Surgery

## 2015-08-20 ENCOUNTER — Ambulatory Visit (HOSPITAL_COMMUNITY)
Admission: RE | Admit: 2015-08-20 | Discharge: 2015-08-20 | Disposition: A | Discharge status: Home / Self Care | Payer: 59 | Source: Ambulatory Visit | Attending: Orthopedic Surgery | Admitting: Orthopedic Surgery

## 2015-08-20 ENCOUNTER — Ambulatory Visit (HOSPITAL_BASED_OUTPATIENT_CLINIC_OR_DEPARTMENT_OTHER): Payer: 59 | Admitting: Anesthesiology

## 2015-08-20 ENCOUNTER — Encounter (HOSPITAL_COMMUNITY): Payer: Self-pay | Admitting: *Deleted

## 2015-08-20 DIAGNOSIS — S83281A Other tear of lateral meniscus, current injury, right knee, initial encounter: Secondary | ICD-10-CM

## 2015-08-20 DIAGNOSIS — M7072 Other bursitis of hip, left hip: Secondary | ICD-10-CM | POA: Diagnosis not present

## 2015-08-20 HISTORY — PX: KNEE ARTHROSCOPY: SHX127

## 2015-08-20 HISTORY — DX: Unspecified tear of unspecified meniscus, current injury, right knee, initial encounter: S83.206A

## 2015-08-20 HISTORY — DX: Personal history of diseases of the skin and subcutaneous tissue: Z87.2

## 2015-08-20 HISTORY — DX: Personal history of other benign neoplasm: Z86.018

## 2015-08-20 SURGERY — ARTHROSCOPY, KNEE
Anesthesia: General | Site: Knee | Laterality: Right

## 2015-08-20 MED ORDER — PROPOFOL 10 MG/ML IV BOLUS
INTRAVENOUS | Status: AC
  Filled 2015-08-20: qty 20

## 2015-08-20 MED ORDER — SODIUM CHLORIDE 0.9 % IR SOLN
Status: DC | PRN
  Administered 2015-08-20 (×2): 3000 mL

## 2015-08-20 MED ORDER — LIDOCAINE HCL 1 % IJ SOLN
INTRAMUSCULAR | Status: AC
  Filled 2015-08-20: qty 20

## 2015-08-20 MED ORDER — METHYLPREDNISOLONE ACETATE 40 MG/ML IJ SUSP
80.0000 mg | Freq: Once | INTRAMUSCULAR | Status: AC
  Administered 2015-08-20: 80 mg via INTRA_ARTICULAR
  Filled 2015-08-20: qty 2

## 2015-08-20 MED ORDER — KETOROLAC TROMETHAMINE 30 MG/ML IJ SOLN
INTRAMUSCULAR | Status: DC | PRN
  Administered 2015-08-20: 30 mg via INTRAVENOUS

## 2015-08-20 MED ORDER — FENTANYL CITRATE (PF) 100 MCG/2ML IJ SOLN
INTRAMUSCULAR | Status: AC
  Filled 2015-08-20: qty 2

## 2015-08-20 MED ORDER — LIDOCAINE HCL 1 % IJ SOLN
INTRAMUSCULAR | Status: DC | PRN
  Administered 2015-08-20 (×2): 2 mL

## 2015-08-20 MED ORDER — PROPOFOL 10 MG/ML IV BOLUS
INTRAVENOUS | Status: DC | PRN
  Administered 2015-08-20: 200 mg via INTRAVENOUS

## 2015-08-20 MED ORDER — HYDROCODONE-ACETAMINOPHEN 5-325 MG PO TABS: 1.0000 | ORAL_TABLET | Freq: Four times a day (QID) | ORAL | Status: DC | PRN

## 2015-08-20 MED ORDER — LIDOCAINE HCL (CARDIAC) 20 MG/ML IV SOLN
INTRAVENOUS | Status: DC | PRN
  Administered 2015-08-20: 60 mg via INTRAVENOUS

## 2015-08-20 MED ORDER — BUPIVACAINE HCL (PF) 0.25 % IJ SOLN
INTRAMUSCULAR | Status: AC
  Filled 2015-08-20: qty 30

## 2015-08-20 MED ORDER — ONDANSETRON HCL 4 MG/2ML IJ SOLN
INTRAMUSCULAR | Status: AC
  Filled 2015-08-20: qty 2

## 2015-08-20 MED ORDER — CEFAZOLIN SODIUM-DEXTROSE 2-3 GM-% IV SOLR: 2.0000 g | INTRAVENOUS | Status: DC

## 2015-08-20 MED ORDER — LACTATED RINGERS IV SOLN
INTRAVENOUS | Status: DC
  Administered 2015-08-20 (×2): via INTRAVENOUS

## 2015-08-20 MED ORDER — ASPIRIN EC 325 MG PO TBEC: 325.0000 mg | DELAYED_RELEASE_TABLET | Freq: Every day | ORAL | Status: DC

## 2015-08-20 MED ORDER — HYDROCODONE-ACETAMINOPHEN 5-325 MG PO TABS
1.0000 | ORAL_TABLET | Freq: Once | ORAL | Status: AC
  Administered 2015-08-20: 1 via ORAL

## 2015-08-20 MED ORDER — LIDOCAINE-EPINEPHRINE (PF) 1 %-1:200000 IJ SOLN
INTRAMUSCULAR | Status: DC | PRN
  Administered 2015-08-20: 20 mL

## 2015-08-20 MED ORDER — FENTANYL CITRATE (PF) 100 MCG/2ML IJ SOLN
INTRAMUSCULAR | Status: DC | PRN
  Administered 2015-08-20 (×8): 25 ug via INTRAVENOUS

## 2015-08-20 MED ORDER — STERILE WATER FOR IRRIGATION IR SOLN
Status: DC | PRN
  Administered 2015-08-20: 500 mL

## 2015-08-20 MED ORDER — DEXAMETHASONE SODIUM PHOSPHATE 4 MG/ML IJ SOLN
INTRAMUSCULAR | Status: DC | PRN
  Administered 2015-08-20: 10 mg via INTRAVENOUS

## 2015-08-20 MED ORDER — LIDOCAINE HCL (CARDIAC) 20 MG/ML IV SOLN
INTRAVENOUS | Status: AC
  Filled 2015-08-20: qty 5

## 2015-08-20 MED ORDER — KETOROLAC TROMETHAMINE 30 MG/ML IJ SOLN
INTRAMUSCULAR | Status: AC
  Filled 2015-08-20: qty 1

## 2015-08-20 MED ORDER — ONDANSETRON HCL 4 MG/2ML IJ SOLN
INTRAMUSCULAR | Status: DC | PRN
  Administered 2015-08-20: 4 mg via INTRAVENOUS

## 2015-08-20 MED ORDER — LACTATED RINGERS IV SOLN: INTRAVENOUS | Status: DC

## 2015-08-20 MED ORDER — LIDOCAINE-EPINEPHRINE (PF) 1 %-1:200000 IJ SOLN
INTRAMUSCULAR | Status: AC
  Filled 2015-08-20: qty 30

## 2015-08-20 MED ORDER — CHLORHEXIDINE GLUCONATE 4 % EX LIQD: 60.0000 mL | Freq: Once | CUTANEOUS | Status: DC

## 2015-08-20 MED ORDER — BUPIVACAINE-EPINEPHRINE 0.25% -1:200000 IJ SOLN
INTRAMUSCULAR | Status: DC | PRN
  Administered 2015-08-20: 30 mL

## 2015-08-20 MED ORDER — MIDAZOLAM HCL 2 MG/2ML IJ SOLN
INTRAMUSCULAR | Status: AC
  Filled 2015-08-20: qty 2

## 2015-08-20 MED ORDER — CEFAZOLIN SODIUM-DEXTROSE 2-3 GM-% IV SOLR
2.0000 g | Freq: Once | INTRAVENOUS | Status: AC
  Administered 2015-08-20: 2 g via INTRAVENOUS

## 2015-08-20 MED ORDER — MIDAZOLAM HCL 5 MG/5ML IJ SOLN
INTRAMUSCULAR | Status: DC | PRN
  Administered 2015-08-20: 2 mg via INTRAVENOUS

## 2015-08-20 MED ORDER — DEXAMETHASONE SODIUM PHOSPHATE 10 MG/ML IJ SOLN
INTRAMUSCULAR | Status: AC
  Filled 2015-08-20: qty 1

## 2015-08-20 MED ORDER — FENTANYL CITRATE (PF) 100 MCG/2ML IJ SOLN: 25.0000 ug | INTRAMUSCULAR | Status: DC | PRN

## 2015-08-20 MED ORDER — HYDROCODONE-ACETAMINOPHEN 5-325 MG PO TABS
ORAL_TABLET | ORAL | Status: AC
  Filled 2015-08-20: qty 1

## 2015-08-20 SURGICAL SUPPLY — 37 items
BANDAGE ACE 6X5 VEL STRL LF (GAUZE/BANDAGES/DRESSINGS) ×1 IMPLANT
BANDAGE ELASTIC 6 VELCRO ST LF (GAUZE/BANDAGES/DRESSINGS) ×2 IMPLANT
BLADE 4.2CUDA (BLADE) IMPLANT
BLADE CUDA SHAVER 3.5 (BLADE) ×2 IMPLANT
CLOTH BEACON ORANGE TIMEOUT ST (SAFETY) ×2 IMPLANT
DRAPE ARTHROSCOPY W/POUCH 114 (DRAPES) ×2 IMPLANT
DRSG EMULSION OIL 3X3 NADH (GAUZE/BANDAGES/DRESSINGS) ×2 IMPLANT
DRSG PAD ABDOMINAL 8X10 ST (GAUZE/BANDAGES/DRESSINGS) ×1 IMPLANT
DURAPREP 26ML APPLICATOR (WOUND CARE) ×2 IMPLANT
ELECT MENISCUS 165MM 90D (ELECTRODE) IMPLANT
ELECT REM PT RETURN 9FT ADLT (ELECTROSURGICAL)
ELECTRODE REM PT RTRN 9FT ADLT (ELECTROSURGICAL) IMPLANT
GAUZE SPONGE 4X4 12PLY STRL (GAUZE/BANDAGES/DRESSINGS) ×1 IMPLANT
GLOVE BIO SURGEON STRL SZ7.5 (GLOVE) ×2 IMPLANT
GOWN STRL REUS W/ TWL LRG LVL3 (GOWN DISPOSABLE) ×1 IMPLANT
GOWN STRL REUS W/TWL LRG LVL3 (GOWN DISPOSABLE) ×2
IV CATH 18GX1.25 SAFE RETR GRN (IV SOLUTION) ×1 IMPLANT
IV CATH 22GX1 FEP (IV SOLUTION) ×2 IMPLANT
KIT ROOM TURNOVER WOR (KITS) ×2 IMPLANT
KNEE WRAP E Z 3 GEL PACK (MISCELLANEOUS) ×2 IMPLANT
MANIFOLD NEPTUNE II (INSTRUMENTS) ×1 IMPLANT
PACK ARTHROSCOPY DSU (CUSTOM PROCEDURE TRAY) ×2 IMPLANT
PACK BASIN DAY SURGERY FS (CUSTOM PROCEDURE TRAY) ×2 IMPLANT
PAD CAST 4YDX4 CTTN HI CHSV (CAST SUPPLIES) IMPLANT
PADDING CAST COTTON 4X4 STRL (CAST SUPPLIES) ×2
PADDING CAST COTTON 6X4 STRL (CAST SUPPLIES) ×1 IMPLANT
PENCIL BUTTON HOLSTER BLD 10FT (ELECTRODE) IMPLANT
SET ARTHROSCOPY TUBING (MISCELLANEOUS) ×2
SET ARTHROSCOPY TUBING LN (MISCELLANEOUS) ×1 IMPLANT
SPONGE GAUZE 4X4 12PLY (GAUZE/BANDAGES/DRESSINGS) ×2 IMPLANT
SUT ETHILON 4 0 PS 2 18 (SUTURE) ×2 IMPLANT
SWABSTICK POVIDONE IODINE SNGL (MISCELLANEOUS) ×1 IMPLANT
SYR 30ML LL (SYRINGE) ×2 IMPLANT
SYR CONTROL 10ML LL (SYRINGE) ×4 IMPLANT
TOWEL OR 17X24 6PK STRL BLUE (TOWEL DISPOSABLE) ×2 IMPLANT
TUBE CONNECTING 12X1/4 (SUCTIONS) ×2 IMPLANT
WATER STERILE IRR 500ML POUR (IV SOLUTION) ×2 IMPLANT

## 2015-08-20 NOTE — Anesthesia Procedure Notes (Signed)
Procedure Name: LMA Insertion Date/Time: 08/20/2015 10:46 AM Performed by: Justice Rocher Pre-anesthesia Checklist: Patient identified, Emergency Drugs available, Suction available and Patient being monitored Patient Re-evaluated:Patient Re-evaluated prior to inductionOxygen Delivery Method: Circle System Utilized Preoxygenation: Pre-oxygenation with 100% oxygen Intubation Type: IV induction Ventilation: Mask ventilation without difficulty LMA: LMA inserted LMA Size: 4.0 Number of attempts: 1 Airway Equipment and Method: Bite block Placement Confirmation: positive ETCO2 Tube secured with: Tape Dental Injury: Teeth and Oropharynx as per pre-operative assessment

## 2015-08-20 NOTE — Op Note (Signed)
Erin Hall, Erin Hall                ACCOUNT NO.:  [id number]  MEDICAL RECORD NO.:  [id number]  LOCATION:  [facility]                         FACILITY:  [facility]  PHYSICIAN:  Pietro Cassis. Alvan Dame, M.D.  DATE OF BIRTH:  [birth year]  DATE OF CONSULTATION:  08/20/2015 DATE OF DISCHARGE:  08/20/2015                                CONSULTATION   PREOPERATIVE DIAGNOSES: 1. Bilateral trochanteric hip bursitis. 2. Right knee lateral meniscal tear associated with medial meniscal     tear and degenerative joint changes.  POSTOPERATIVE DIAGNOSES/FINDINGS: 1. Bilateral hip trochanteric bursitis. 2. Right knee complex tearing to the posterior horn, midbody and     medial meniscus. 3. Right knee complex tearing to the lateral meniscus, midbody and     anterior horn. 4. 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  PROCEDURES: 1. Bilateral trochanteric injections.  These were done with 80 mg of     Depo-Medrol and 2 mL of lidocaine into each lateral hip under     sterile prep. 2. Right knee diagnostic and operative arthroscopy with medial and     lateral partial meniscectomies. 3. Medial and lateral chondroplasties, debridement of chondral flaps,     no evidence of any eburnated bone.  There was no microfracture     carried out.  SURGEON:  Pietro Cassis. Alvan Dame, M.D.  ASSISTANT:  Surgical team.  ANESTHESIA:  General.  SPECIMENS:  None.  COMPLICATION:  None.  BLOOD LOSS:  Minimal.  TOURNIQUET:  Not utilized.  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  She has had recurrent increasing symptoms of the right knee that did not respond to conservative measures, medications, injections, activity modification, strengthening. MRI had revealed medial and lateral meniscal pathology correlating with exam findings with preserved joint  spaces radiographically.  Based on the persistence and the recurrence of her symptoms, she wishes at this point to proceed with more definitive measures.  We thus discussed surgical intervention in terms of arthroscopic surgery for debridement purposes.  Risks of infection discussed, risk of recurrent meniscal pathology discussed, risk of progressive degenerative changes reviewed, consent was obtained for benefit of pain relief.  PROCEDURE IN DETAIL:  The patient was brought to the operative theater. Once adequate anesthesia, preoperative antibiotics administered, she was positioned supine.  In the office, she had wished that I injected both of her hips.  She is having some bilateral hip pain over the lateral aspect of the hip, seen in the office.  She preferred to have this done when she was asleep this time.  Once she was positioned and adequately anesthetized, time-out was performed to identifying the patient, planned procedure and identify procedures.  I then injected both hips laterally over the trochanteric region with 80 mg of Depo-Medrol and lidocaine.  This was done without complication.  Upon completion of these two procedures, the right lower extremity was placed into a leg holder.  The right lower extremity was then prepped and draped in sterile fashion.  A second  time-out was performed to identifying this procedure on the right extremity in the patient.  The standard inferomedial, superomedial and inferolateral portals were utilized.  Diagnostic evaluation of the knee revealed the above findings.  The inferomedial portal was utilized first as a working portal using the [employer] followed by 3.5 Research scientist (life sciences). Partial meniscectomy was carried out in the posterior horn to midbody junction of the medial meniscus.  Once this was back to a stable level, I used a 3.5 Cuda shaver and removed meniscal fragments and blending contour remaining meniscus as well as  performed chondroplasty.  The chondroplasty involved debriding chondral flaps on the medial femoral condyle.  For now, we made significant degenerative changes on the tibia surface, but no eburnated bone.  This was noted to be grade 3 changes on the central aspect of the distal to slight posterior aspect of the medial femoral condyle.  Once I was satisfied with the overall stability of the articular meniscal cartilage addressed laterally.  Initially through the medial portal, I used a straight-biting basket and a 3.5 Cuda shaver to remove meniscal tearing of the midbody up to the anterior horn.  I then switched the portals and using the inferolateral portal as a working portal.  I was able to better perform partial meniscectomy over the anterior horn and midbody junction utilizing the 3.5 Cuda shaver.  She was noted to have some degenerative changes over the tibial plateau surfaces, diffuse grade 2-3 changes.  Chondral flaps were debrided back to a stable level, again, no evidence of eburnated bone.  Once I was satisfied with the stability of the medial and lateral articular, meniscal cartilages, returned the arthroscope to the lateral portal and addressed anteriorly.  Other than noting a slightly lateral patellar tilting, there was no significant degenerative change, requiring any advanced debridement.  Once I was satisfied with this region and follow on re-examination of the knee, I did remove the instrumentations and reapproximated the portal sites using the 4-0 nylon.  The knee was then injected into the case with 0.25% Marcaine. She was then brought to the recovery room in stable condition, tolerating the procedure well.  I will see her back in the office in 2 weeks.  Range of motion, exercise, icing were all reviewed and discussed preoperatively.     Pietro Cassis Alvan Dame, M.D.     MDO/MEDQ  D:  08/20/2015  T:  08/20/2015  Job:  [id number]

## 2015-08-20 NOTE — Anesthesia Postprocedure Evaluation (Signed)
Anesthesia Post Note  Patient: Erin Hall  Procedure(s) Performed: Procedure(s) (LRB): ARTHROSCOPY RIGHT KNEE/ MEDIAL AND LATERAL PARTIAL MENISCECTOMY/ MEDIAL AND LATERAL PARTIAL CHONDROPLASTY/ BILATERAL TROCH INJECTION (Right)  Patient location during evaluation: PACU Anesthesia Type: General Level of consciousness: awake and alert Pain management: pain level controlled Vital Signs Assessment: post-procedure vital signs reviewed and stable Respiratory status: spontaneous breathing, nonlabored ventilation, respiratory function stable and patient connected to nasal cannula oxygen Cardiovascular status: blood pressure returned to baseline and stable Postop Assessment: no signs of nausea or vomiting Anesthetic complications: no    Last Vitals:  Filed Vitals:   08/20/15 1230 08/20/15 1309  BP: 130/77 126/66  Pulse: 76 76  Temp:  36.9 C  Resp: 15 14    Last Pain:  Filed Vitals:   08/20/15 1318  PainSc: 6                  Savio Albrecht L

## 2015-08-20 NOTE — H&P (Signed)
CC- Erin Hall is a 54 y.o. female who presents with right knee pain.  HPI- . Knee Pain: Patient presents with knee pain involving the  right knee. Onset of the symptoms was several months ago. Inciting event: this is a longstanding problem which has been getting worse. Current symptoms include foreign body sensation, giving out, locking, pain located laterally and swelling. Pain is aggravated by lateral movements and walking.  Patient has had prior knee problems. Evaluation to date: plain films: abnormal with some joint space laterally more so than medial and MRI: abnormal with lateral meniscal tear, degenerative medial meniscal tearing as well as patellofemoral and lateral compartment chondromalacia. Treatment to date: avoidance of offending activity, corticosteroid injection which was somewhat effective, prescription NSAIDS which are somewhat effective and PT which was not very effective.  Past Medical History  Diagnosis Date  . Hypothyroidism   . Hyperlipidemia     hx of  . History of seasonal allergies   . History of cellulitis     DOG BITE RIGHT THIGH 2002  . Right knee meniscal tear   . History of uterine fibroid     Past Surgical History  Procedure Laterality Date  . Breast reduction surgery Bilateral 02-08-2007  . Knee surgery Left age 76    osteomalacia  . Uterine artery embolization  04/17/2012  . Robotic assisted total hysterectomy N/A 12/12/2013    Procedure: ROBOTIC ASSISTED TOTAL HYSTERECTOMY;  Surgeon: Marvene Staff, MD;  Location: [facility];  Service: Gynecology;  Laterality: N/A;  . Bilateral salpingectomy N/A 12/12/2013    Procedure: BILATERAL SALPINGECTOMY;  Surgeon: Marvene Staff, MD;  Location: [facility];  Service: Gynecology;  Laterality: N/A;  . Knee arthroscopy Left several -- last one 2008  . I & d right thigh abscess  12-04-2000    Prior to Admission medications   Medication Sig Start Date End Date Taking? Authorizing Provider  Biotin 5 MG TABS Take  10 mg by mouth daily.   Yes Historical Provider, MD  celecoxib (CELEBREX) 200 MG capsule Take 200 mg by mouth every evening.    Yes Historical Provider, MD  cetirizine (ZYRTEC) 10 MG tablet Take 10 mg by mouth every evening.   Yes Historical Provider, MD  cholecalciferol (VITAMIN D) 1000 UNITS tablet Take 1,000 Units by mouth every evening.   Yes Historical Provider, MD  fish oil-omega-3 fatty acids 1000 MG capsule Take 1 g by mouth every evening.   Yes Historical Provider, MD  levothyroxine (SYNTHROID, LEVOTHROID) 75 MCG tablet Take 75 mcg by mouth daily before breakfast.  05/09/14  Yes Historical Provider, MD  magnesium oxide (MAG-OX) 400 MG tablet Take 400 mg by mouth daily.   Yes Historical Provider, MD  MINIVELLE 0.1 MG/24HR patch Place 1 patch onto the skin 2 (two) times a week.  05/09/14  Yes Historical Provider, MD  Multiple Vitamin (MULTIVITAMIN WITH MINERALS) TABS Take 1 tablet by mouth every evening.   Yes Historical Provider, MD  vitamin C (ASCORBIC ACID) 500 MG tablet Take 500 mg by mouth every evening.   Yes Historical Provider, MD    soft tissue tenderness over medial and lateral joint lines, effusion, reduced range of motion, collateral ligaments intact, normal ipsilateral hip exam, normal contralateral knee exam  Physical Examination: General appearance - alert, well appearing, and in no distress Mental status - alert, oriented to person, place, and time, normal mood, behavior, speech, dress, motor activity, and thought processes Chest - no tachypnea, retractions or cyanosis Heart - normal  rate and regular rhythm Abdomen - soft, nontender, nondistended, no masses or organomegaly Neurological - alert, oriented, normal speech, no focal findings or movement disorder noted Musculoskeletal - see above for right knee clinical findings Extremities - peripheral pulses normal, no pedal edema, no clubbing or cyanosis Skin - normal coloration and turgor, no rashes, no suspicious skin  lesions noted   Asessment/Plan--- Right knee lateral and medial meniscal tearing associated with lateral and patellofemoral chondromalacia  Plan: Right knee arthroscopy with medial and lateral meniscal debridement plus chondroplasty. Procedure risks and potential comps discussed with patient who elects to proceed. Goals are decreased pain and increased function with a high likelihood of achieving both

## 2015-08-20 NOTE — Transfer of Care (Signed)
Immediate Anesthesia Transfer of Care Note  Patient: Erin Hall  Procedure(s) Performed: Procedure(s) (LRB): ARTHROSCOPY RIGHT KNEE/ MEDIAL AND LATERAL PARTIAL MENISCECTOMY/ MEDIAL AND LATERAL PARTIAL CHONDROPLASTY/ BILATERAL TROCH INJECTION (Right)  Patient Location: PACU  Anesthesia Type: General  Level of Consciousness: awake, sedated, patient cooperative and responds to stimulation  Airway & Oxygen Therapy: Patient Spontanous Breathing and Patient connected to face mask oxygen  Post-op Assessment: Report given to PACU RN, Post -op Vital signs reviewed and stable and Patient moving all extremities  Post vital signs: Reviewed and stable  Complications: No apparent anesthesia complications

## 2015-08-20 NOTE — Brief Op Note (Signed)
08/20/2015  10:46 AM  PATIENT:  Erin Hall  54 y.o. female  PRE-OPERATIVE DIAGNOSIS:  1.  Right knee, 5.  Bilateral trochanteric bursitis  POST-OPERATIVE DIAGNOSIS:   PROCEDURE:  Procedure(s): ARTHROSCOPY RIGHT KNEE/ BILATERAL TROCH INJECTION (Right)  SURGEON:  Surgeon(s) and Role:    * Paralee Cancel, MD - Primary  PHYSICIAN ASSISTANT: None  ANESTHESIA:   general  EBL:   None  BLOOD ADMINISTERED:none  DRAINS: none   LOCAL MEDICATIONS USED:  MARCAINE     SPECIMEN:  No Specimen  DISPOSITION OF SPECIMEN:  N/A  COUNTS:  YES  TOURNIQUET:  * No tourniquets in log *  DICTATION: .Other Dictation: Dictation Number [id number]  PLAN OF CARE: Discharge to home after PACU  PATIENT DISPOSITION:  PACU - hemodynamically stable.   Delay start of Pharmacological VTE agent (>24hrs) due to surgical blood loss or risk of bleeding: no

## 2015-08-20 NOTE — Discharge Instructions (Signed)

## 2015-08-20 NOTE — Anesthesia Preprocedure Evaluation (Addendum)
Anesthesia Evaluation  Patient identified by MRN, date of birth, ID band Patient awake    Reviewed: Allergy & Precautions, H&P , NPO status , Patient's Chart, lab work & pertinent test results  History of Anesthesia Complications Negative for: history of anesthetic complications  Airway Mallampati: II  TM Distance: >3 FB Neck ROM: full    Dental no notable dental hx. (+) Dental Advisory Given, Teeth Intact   Pulmonary neg pulmonary ROS,    Pulmonary exam normal breath sounds clear to auscultation       Cardiovascular Exercise Tolerance: Good negative cardio ROS Normal cardiovascular exam Rhythm:regular Rate:Normal     Neuro/Psych negative neurological ROS  negative psych ROS   GI/Hepatic negative GI ROS, Neg liver ROS,   Endo/Other  negative endocrine ROSHypothyroidism   Renal/GU negative Renal ROS  negative genitourinary   Musculoskeletal   Abdominal   Peds  Hematology negative hematology ROS (+)   Anesthesia Other Findings   Reproductive/Obstetrics negative OB ROS                             Anesthesia Physical Anesthesia Plan  ASA: I  Anesthesia Plan: General   Post-op Pain Management:    Induction: Intravenous  Airway Management Planned: LMA  Additional Equipment:   Intra-op Plan:   Post-operative Plan:   Informed Consent: I have reviewed the patients History and Physical, chart, labs and discussed the procedure including the risks, benefits and alternatives for the proposed anesthesia with the patient or authorized representative who has indicated his/her understanding and acceptance.   Dental Advisory Given  Plan Discussed with: CRNA and Surgeon  Anesthesia Plan Comments:        Anesthesia Quick Evaluation

## 2015-08-21 ENCOUNTER — Encounter (HOSPITAL_BASED_OUTPATIENT_CLINIC_OR_DEPARTMENT_OTHER): Payer: Self-pay | Admitting: Orthopedic Surgery

## 2015-08-28 MED FILL — MINIVELLE 0.1 MG PATCH: 0.1 | 28 days supply | Qty: 8 | Fill #4

## 2015-09-01 ENCOUNTER — Ambulatory Visit: Payer: 59 | Attending: Family Medicine | Admitting: Physical Therapy

## 2015-09-01 DIAGNOSIS — M24661 Ankylosis, right knee: Secondary | ICD-10-CM | POA: Diagnosis not present

## 2015-09-01 DIAGNOSIS — M25561 Pain in right knee: Secondary | ICD-10-CM | POA: Insufficient documentation

## 2015-09-01 DIAGNOSIS — M25461 Effusion, right knee: Secondary | ICD-10-CM | POA: Insufficient documentation

## 2015-09-01 DIAGNOSIS — R262 Difficulty in walking, not elsewhere classified: Secondary | ICD-10-CM | POA: Diagnosis not present

## 2015-09-01 DIAGNOSIS — M6281 Muscle weakness (generalized): Secondary | ICD-10-CM | POA: Insufficient documentation

## 2015-09-01 NOTE — Patient Instructions (Signed)
   Copyright  VHI. All rights reserved.  HIP: Flexion / KNEE: Extension, Straight Leg Raise   Raise leg, keeping knee straight. Perform slowly. 10___ reps per set, _3__ sets per day, __7_ days per week   Copyright  VHI. All rights reserved.  Heel Slide   Bend knee and pull heel toward buttocks. Hold __10__ seconds. Return. Repeat with other knee. Repeat _10___ times. Do __3__ sessions per day.  http://gt2.exer.us/372   Copyright  VHI. All rights reserved.     Raise leg until knee is straight. __10_ reps per set, __3_ sets per day, _7__ days per week  Copyright  VHI. All rights reserved.  Short [employer] a large can or rolled towel under leg. Straighten knee and leg. Hold __10__ seconds. Repeat with other leg. Repeat __10__ times. Do _3___ sessions per day.  http://gt2.exer.us/365   Copyright  VHI. All rights reserved.  Quad Set   Slowly tighten muscles on thigh of straight leg while counting out loud to 10____. Repeat with other leg. Repeat _10___ times. Do ___3_ sessions per day.  http://gt2.exer.us/361   Copyright  VHI. All rights reserved.

## 2015-09-01 NOTE — Therapy (Signed)
[place], [place], [address] Phone: [phone]   Fax:  [phone]  Physical Therapy Evaluation  Patient Details  Name: Erin Hall MRN: [id number] Date of Birth: 12/30/1960 Referring Provider: Dr. Alvan Dame  Encounter Date: 09/01/2015      PT End of Session - 09/01/15 1453    Visit Number 1   Number of Visits 16   Date for PT Re-Evaluation 10/27/15   Authorization Type [employer] [place]   PT Start Time 1416   PT Stop Time 1500   PT Time Calculation (min) 44 min   Activity Tolerance Patient tolerated treatment well      Past Medical History  Diagnosis Date  . Hypothyroidism   . Hyperlipidemia     hx of  . History of seasonal allergies   . History of cellulitis     DOG BITE RIGHT THIGH 2002  . Right knee meniscal tear   . History of uterine fibroid     Past Surgical History  Procedure Laterality Date  . Breast reduction surgery Bilateral 02-08-2007  . Knee surgery Left age 38    osteomalacia  . Uterine artery embolization  04/17/2012  . Robotic assisted total hysterectomy N/A 12/12/2013    Procedure: ROBOTIC ASSISTED TOTAL HYSTERECTOMY;  Surgeon: Marvene Staff, MD;  Location: [facility];  Service: Gynecology;  Laterality: N/A;  . Bilateral salpingectomy N/A 12/12/2013    Procedure: BILATERAL SALPINGECTOMY;  Surgeon: Marvene Staff, MD;  Location: [facility];  Service: Gynecology;  Laterality: N/A;  . Knee arthroscopy Left several -- last one 2008  . I & d right thigh abscess  12-04-2000  . Knee arthroscopy Right 08/20/2015    Procedure: ARTHROSCOPY RIGHT KNEE/ MEDIAL AND LATERAL PARTIAL MENISCECTOMY/ MEDIAL AND LATERAL PARTIAL CHONDROPLASTY/ BILATERAL [place] INJECTION;  Surgeon: Paralee Cancel, MD;  Location: [facility];  Service: Orthopedics;  Laterality: Right;  AND BILATERAL HIPS    There were no vitals filed for this visit.  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  States the doctor said she should get "good results."  No crutches needed.  Had hip injections as well.     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 Yes   Pain Score 5    Pain Location Hip   Pain Orientation Right   Multiple Pain Sites Yes   Pain Score 0   Pain Location Knee   Pain Orientation Right   Pain Type Surgical pain   Pain Frequency Intermittent   Aggravating Factors  night time; bending knee; squatting    Pain Relieving Factors ice, elevation, compression            [facility] PT Assessment - 09/01/15 1429    Assessment   Medical Diagnosis right knee scope/ OA   Referring Provider Dr. Alvan Dame  Onset Date/Surgical Date 08/20/15   Hand Dominance Right   Next MD Visit not scheduled   Prior Therapy Fall 2016 4-5 weeks   Precautions   Precautions None   Restrictions   Weight Bearing Restrictions No   Balance Screen   Has the patient fallen in the past 6 months No   Has the patient had a decrease in activity level because of a fear of falling?  No   Is the patient reluctant to leave their home because of a fear of falling?  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   Leisure movies, work outside   Observation/Other Assessments   Focus on Therapeutic Outcomes (FOTO)  55% limitation   Posture/Postural Control   Posture Comments mild ant knee swelling;  3 scope incisions still healing, no drainage;  bruising medial knee   ROM / Strength   AROM / PROM / Strength AROM   AROM   Right Knee Extension 5  0 degrees in supine;  quad lag with SLR and LAQ   Right Knee Flexion 110   Left Knee Extension 0   Left Knee Flexion 128   Strength   Strength Assessment Site Hip   Right/Left Hip Right   Right Hip Flexion 4+/5   Right Hip Extension 4/5   Right Hip ABduction 4/5   Right Knee Flexion 4-/5   Right Knee Extension 4-/5   Left Knee Flexion 5/5   Left Knee Extension 5/5   Ambulation/Gait   Ambulation/Gait --  mild limp no assistive device                   [facility] Adult PT Treatment/Exercise - 09/01/15 1429    Knee/Hip Exercises: Aerobic   Nustep 10 min L1                PT Education - 09/01/15 1452    Education provided Yes   Education Details knee level 1 ROM/early strengthening   Person(s) Educated Patient   Methods Explanation   Comprehension Verbalized understanding;Returned demonstration          PT Short Term Goals - 09/01/15 1527    PT SHORT TERM GOAL #1   Title pt will be I with inital HEP (09/29/15)   Time 4   Period Weeks   Status New   PT SHORT TERM GOAL #2   Title pt will be able to verbalize and demonstrate techniques to reduce bil knee swelling and pain via RICE and HEP (09/29/15)   Time 4   Period Weeks   Status New           PT Long Term Goals - 09/01/15 1529    PT LONG TERM GOAL #1   Title pt will be I with all HEP given throughout therapy (10/27/15)   Time 8   Period Weeks   Status New   PT LONG TERM GOAL #2   Title pt will increase right knee AROM to > 120 - 0 degrees to assist with an efficent gait pattern (10/27/15)   Time 8   Period Weeks    Status New   PT LONG TERM GOAL #3   Title pt will  be able to stand/ walk > 30 minutes with < 3/10 pain  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   Time 8   Period Weeks   Status New   PT [place] #4   Title pt will be bale to navigate up/down > 12 steps with < 3/10 pain to assist with job requirments and community ambulation   Time 8   Period Weeks   Status New   PT LONG TERM GOAL #5   Title pt will improve her FOTO score to 41% limitation to demonstrate improved function with less pain at discharge    Time 8   Period Weeks   Status New   Additional Long Term Goals   Additional Long Term Goals Yes   PT LONG TERM GOAL #6   Title Right hip and knee strength to at least 4+/5 needed for standing/walking for long periods of time for work   Time [place] - 09/01/15 1520    Clinical Impression Statement The patient presents s/p right knee arthroscopy on 08/20/15 for right knee OA.   Evaluation performed of low complexity.   She reports her knee "already feels better" although she has right LE muscle spasms.  Her knee AROM is limited:  5-110 (0 degrees supine).  Positive quad lag with SLR.  Decreased quad and HS strength 4-/5.  She is able to ambulate without assistive device with min limp.  Anterior knee swelling.  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  She also hopes to return to yoga and zumba classes.     Pt will benefit from skilled therapeutic intervention in order to improve on the following deficits Hypomobility;Pain;Postural dysfunction;Improper body mechanics;Decreased endurance;Decreased range of motion;Decreased balance;Decreased activity tolerance;Decreased mobility;Increased muscle spasms;Increased edema   Rehab Potential Good   PT Frequency 2x / week   PT Duration 8 weeks   PT  Treatment/Interventions ADLs/Self Care Home Management;Cryotherapy;Electrical Stimulation;Iontophoresis 4mg /ml Dexamethasone;Moist Heat;Ultrasound;Therapeutic activities;Therapeutic exercise;Manual techniques;Taping;Vasopneumatic Device;Passive range of motion;Patient/family education   PT Next Visit Plan continue Nu-step or Bike for ROM;  low level ROM and beginning strengthening open and closed chain (s/p 12 days post-op scope);  cryotherapy for edema control/pain control   PT Home Exercise Plan Knee level 1 ROM and beginning strengthening         Problem List Patient Active Problem List   Diagnosis Date Noted  . S/P hysterectomy 12/12/2013  . Leiomyoma of body of uterus 04/18/2012    Alvera Singh 09/01/2015, 3:37 PM  [place], [place], [address] Phone: [phone]   Fax:  [phone]  Name: Cheryal Goldfine MRN: [id number] Date of Birth: 1961/01/24   Ruben Im, PT 09/01/2015 3:37 PM Phone: [phone] Fax: [phone]

## 2015-09-03 ENCOUNTER — Ambulatory Visit: Payer: 59 | Admitting: Physical Therapy

## 2015-09-03 DIAGNOSIS — R262 Difficulty in walking, not elsewhere classified: Secondary | ICD-10-CM | POA: Diagnosis not present

## 2015-09-03 DIAGNOSIS — M25461 Effusion, right knee: Secondary | ICD-10-CM | POA: Diagnosis not present

## 2015-09-03 DIAGNOSIS — M24661 Ankylosis, right knee: Secondary | ICD-10-CM | POA: Diagnosis not present

## 2015-09-03 DIAGNOSIS — M6281 Muscle weakness (generalized): Secondary | ICD-10-CM | POA: Diagnosis not present

## 2015-09-03 DIAGNOSIS — M25561 Pain in right knee: Secondary | ICD-10-CM | POA: Diagnosis not present

## 2015-09-03 NOTE — Therapy (Signed)
[place], [place], [address] Phone: [phone]   Fax:  [phone]  Physical Therapy Treatment  Patient Details  Name: Erin Hall MRN: [id number] Date of Birth: [birth year] Referring Provider: Dr. Alvan Dame  Encounter Date: 09/03/2015      PT End of Session - 09/03/15 1421    Visit Number 2   Number of Visits 16   Date for PT Re-Evaluation 10/27/15   Authorization Type [employer] [place]   PT Start Time 1350  pt arrived 20 minutes late   PT Stop Time 1420   PT Time Calculation (min) 30 min   Activity Tolerance Patient tolerated treatment well   Behavior During Therapy [facility] for tasks assessed/performed      Past Medical History  Diagnosis Date  . Hypothyroidism   . Hyperlipidemia     hx of  . History of seasonal allergies   . History of cellulitis     DOG BITE RIGHT THIGH 2002  . Right knee meniscal tear   . History of uterine fibroid     Past Surgical History  Procedure Laterality Date  . Breast reduction surgery Bilateral 02-08-2007  . Knee surgery Left age 22    osteomalacia  . Uterine artery embolization  04/17/2012  . Robotic assisted total hysterectomy N/A 12/12/2013    Procedure: ROBOTIC ASSISTED TOTAL HYSTERECTOMY;  Surgeon: Marvene Staff, MD;  Location: [facility];  Service: Gynecology;  Laterality: N/A;  . Bilateral salpingectomy N/A 12/12/2013    Procedure: BILATERAL SALPINGECTOMY;  Surgeon: Marvene Staff, MD;  Location: [facility];  Service: Gynecology;  Laterality: N/A;  . Knee arthroscopy Left several -- last one 2008  . I & d right thigh abscess  12-04-2000  . Knee arthroscopy Right 08/20/2015    Procedure: ARTHROSCOPY RIGHT KNEE/ MEDIAL AND LATERAL PARTIAL MENISCECTOMY/ MEDIAL AND LATERAL PARTIAL CHONDROPLASTY/ BILATERAL [place] INJECTION;  Surgeon: Paralee Cancel, MD;  Location: [facility];  Service: Orthopedics;  Laterality: Right;  AND BILATERAL HIPS    There  were no vitals filed for this visit.  Visit Diagnosis:  Decreased range of knee movement, right  Knee swelling, right  Right knee pain  Difficulty in walking  Muscle weakness of lower extremity      Subjective Assessment - 09/03/15 1350    Subjective "I am doing pretty good, I have some soreness but it really isn't bad"   Currently in Pain? Yes   Pain Score 0-No pain   Pain Orientation Right   Pain Onset More than a month ago   Aggravating Factors  deep knee bending,                          [facility] Adult PT Treatment/Exercise - 09/03/15 0001    Knee/Hip Exercises: Stretches   Active Hamstring Stretch 2 reps;30 seconds   Other Knee/Hip Stretches ITB 2 x 30 performed bil   Knee/Hip Exercises: Aerobic   Nustep 10 min L5   Manual Therapy   Soft tissue mobilization DTM over bil  glute medius/ and R vastus lateralus   Myofascial Release manual trigger point release of R vastus lateralus, and bil glute Medius                PT Education - 09/03/15 1421    Education provided Yes   Education Details reviewed and updated HEP   Person(s) Educated Patient   Methods Explanation   Comprehension Verbalized understanding  PT Short Term Goals - 09/01/15 1527    PT SHORT TERM GOAL #1   Title pt will be I with inital HEP (09/29/15)   Time 4   Period Weeks   Status New   PT SHORT TERM GOAL #2   Title pt will be able to verbalize and demonstrate techniques to reduce bil knee swelling and pain via RICE and HEP (09/29/15)   Time 4   Period Weeks   Status New           PT Long Term Goals - 09/01/15 1529    PT LONG TERM GOAL #1   Title pt will be I with all HEP given throughout therapy (10/27/15)   Time 8   Period Weeks   Status New   PT LONG TERM GOAL #2   Title pt will increase right knee AROM to > 120 - 0 degrees to assist with an efficent gait pattern (10/27/15)   Time 8   Period Weeks   Status New   PT LONG TERM GOAL #3   Title pt will be  able to stand/ walk > 30 minutes with < 3/10 pain  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   Time 8   Period Weeks   Status New   PT [place] #4   Title pt will be bale to navigate up/down > 12 steps with < 3/10 pain to assist with job requirments and community ambulation   Time 8   Period Weeks   Status New   PT LONG TERM GOAL #5   Title pt will improve her FOTO score to 41% limitation to demonstrate improved function with less pain at discharge    Time 8   Period Weeks   Status New   Additional Long Term Goals   Additional Long Term Goals Yes   PT LONG TERM GOAL #6   Title Right hip and knee strength to at least 4+/5 needed for standing/walking for long periods of time for work   Time 8   Period Weeks   Status New               Plan - 09/03/15 1422    Clinical Impression Statement Erin Hall was 20 minutes to todays visit. reports that she has no pain today and has been consistent with her HEP. focused todays session on decreasing muscle tightness of her Vastus lateralus and glute medius bil and stretching of the glute medius. Pt reported feeling looser following todays session. Plan to progress to strengthening next visit. pt declinded Ice following todays session.    PT Next Visit Plan continue Nu-step or Bike for ROM;  low level ROM and beginning strengthening open and closed chain (s/p 12 days post-op scope);  cryotherapy for edema control/pain control   PT Home Exercise Plan glute medius stretching   Consulted and Agree with Plan of Care Patient        Problem List Patient Active Problem List   Diagnosis Date Noted  . S/P hysterectomy 12/12/2013  . Leiomyoma of body of uterus 04/18/2012   Starr Lake PT, DPT, LAT, ATC  09/03/2015  2:27 PM     [facility] [address] [place], [place], [address] Phone: [phone]   Fax:  [phone]  Name: Erin Hall MRN: [id number] Date of Birth: 12-02-1960

## 2015-09-07 MED FILL — METHOCARBAMOL 500 MG TABLET: 500 | 10 days supply | Qty: 40 | Fill #0

## 2015-09-08 ENCOUNTER — Ambulatory Visit: Payer: 59 | Admitting: Physical Therapy

## 2015-09-08 DIAGNOSIS — R1032 Left lower quadrant pain: Secondary | ICD-10-CM | POA: Diagnosis not present

## 2015-09-08 DIAGNOSIS — E039 Hypothyroidism, unspecified: Secondary | ICD-10-CM | POA: Diagnosis not present

## 2015-09-08 DIAGNOSIS — R252 Cramp and spasm: Secondary | ICD-10-CM | POA: Diagnosis not present

## 2015-09-08 DIAGNOSIS — N83202 Unspecified ovarian cyst, left side: Secondary | ICD-10-CM | POA: Diagnosis not present

## 2015-09-09 ENCOUNTER — Other Ambulatory Visit (HOSPITAL_COMMUNITY): Payer: Self-pay | Admitting: Endocrinology

## 2015-09-09 DIAGNOSIS — E049 Nontoxic goiter, unspecified: Secondary | ICD-10-CM

## 2015-09-10 ENCOUNTER — Ambulatory Visit: Payer: 59 | Admitting: Physical Therapy

## 2015-09-10 DIAGNOSIS — M6281 Muscle weakness (generalized): Secondary | ICD-10-CM | POA: Diagnosis not present

## 2015-09-10 DIAGNOSIS — R262 Difficulty in walking, not elsewhere classified: Secondary | ICD-10-CM | POA: Diagnosis not present

## 2015-09-10 DIAGNOSIS — M25461 Effusion, right knee: Secondary | ICD-10-CM | POA: Diagnosis not present

## 2015-09-10 DIAGNOSIS — M24661 Ankylosis, right knee: Secondary | ICD-10-CM | POA: Diagnosis not present

## 2015-09-10 DIAGNOSIS — M25561 Pain in right knee: Secondary | ICD-10-CM

## 2015-09-10 MED FILL — CELECOXIB 200 MG CAPSULE: 200 | 30 days supply | Qty: 60 | Fill #2

## 2015-09-10 NOTE — Therapy (Signed)
[place], [place], [address] Phone: [phone]   Fax:  [phone]  Physical Therapy Treatment  Patient Details  Name: Erin Hall MRN: [id number] Date of Birth: [birth year] Referring Provider: Dr. Alvan Dame  Encounter Date: 09/10/2015      PT End of Session - 09/10/15 0909    Visit Number 3   Number of Visits 16   Date for PT Re-Evaluation 10/27/15   Authorization Type UMR [place]   PT Start Time 0800   PT Stop Time 0855   PT Time Calculation (min) 55 min   Activity Tolerance Patient tolerated treatment well   Behavior During Therapy [facility] for tasks assessed/performed      Past Medical History  Diagnosis Date  . Hypothyroidism   . Hyperlipidemia     hx of  . History of seasonal allergies   . History of cellulitis     DOG BITE RIGHT THIGH 2002  . Right knee meniscal tear   . History of uterine fibroid     Past Surgical History  Procedure Laterality Date  . Breast reduction surgery Bilateral 02-08-2007  . Knee surgery Left age 8    osteomalacia  . Uterine artery embolization  04/17/2012  . Robotic assisted total hysterectomy N/A 12/12/2013    Procedure: ROBOTIC ASSISTED TOTAL HYSTERECTOMY;  Surgeon: Marvene Staff, MD;  Location: [facility];  Service: Gynecology;  Laterality: N/A;  . Bilateral salpingectomy N/A 12/12/2013    Procedure: BILATERAL SALPINGECTOMY;  Surgeon: Marvene Staff, MD;  Location: [facility];  Service: Gynecology;  Laterality: N/A;  . Knee arthroscopy Left several -- last one 2008  . I & d right thigh abscess  12-04-2000  . Knee arthroscopy Right 08/20/2015    Procedure: ARTHROSCOPY RIGHT KNEE/ MEDIAL AND LATERAL PARTIAL MENISCECTOMY/ MEDIAL AND LATERAL PARTIAL CHONDROPLASTY/ BILATERAL [place] INJECTION;  Surgeon: Paralee Cancel, MD;  Location: [facility];  Service: Orthopedics;  Laterality: Right;  AND BILATERAL HIPS    There were no vitals filed for this  visit.  Visit Diagnosis:  Decreased range of knee movement, right  Knee swelling, right  Difficulty in walking  Muscle weakness of lower extremity  Right knee pain      Subjective Assessment - 09/10/15 0806    Subjective "still having some cramps which I believe it could be something else from the surgery"   Currently in Pain? No/denies   Pain Location Hip   Pain Orientation Right                         [facility] Adult PT Treatment/Exercise - 09/10/15 0811    Knee/Hip Exercises: Aerobic   Nustep L6 x 10 min   Knee/Hip Exercises: Standing   Step Down Right;2 sets;Step Height: 4";Hand Hold: 1;10 reps   SLS with Vectors SLS 4 way hip red theraband    Other Standing Knee Exercises lateral band walks 4 x 15 ft with red theraband   Knee/Hip Exercises: Seated   Long Arc Quad Right;1 set;15 reps;Weights   Long Arc Quad Weight 4 lbs.   Knee/Hip Exercises: Supine   Bridges with Clamshell AROM;Strengthening;Both;1 set;15 reps  red therabdand   Straight Leg Raises AROM;Strengthening;1 set;15 reps  4#   Straight Leg Raises Limitations Verbal cueing to keeping knee straight to reset at end range   Cryotherapy   Number Minutes Cryotherapy 10 Minutes   Cryotherapy Location Knee  R   Type of Cryotherapy  Ice pack                PT Education - 09/10/15 0909    Education provided Yes   Education Details mechanics of glute medius with walking/standing activities   Person(s) Educated Patient   Methods Explanation   Comprehension Verbalized understanding          PT Short Term Goals - 09/10/15 0920    PT SHORT TERM GOAL #1   Title pt will be I with inital HEP (09/29/15)   Time 4   Period Weeks   Status On-going   PT SHORT TERM GOAL #2   Title pt will be able to verbalize and demonstrate techniques to reduce bil knee swelling and pain via RICE and HEP (09/29/15)   Time 4   Period Weeks   Status On-going           PT Long Term Goals - 09/10/15 0920     PT LONG TERM GOAL #1   Title pt will be I with all HEP given throughout therapy (10/27/15)   Time 8   Period Weeks   Status On-going   PT LONG TERM GOAL #2   Title pt will increase right knee AROM to > 120 - 0 degrees to assist with an efficent gait pattern (10/27/15)   Time 8   Period Weeks   Status On-going   PT LONG TERM GOAL #3   Title pt will be able to stand/ walk > 30 minutes with < 3/10 pain  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   Time 8   Period Weeks   Status On-going   PT [place] #4   Title pt will be bale to navigate up/down > 12 steps with < 3/10 pain to assist with job requirments and community ambulation   Time 8   Period Weeks   Status On-going   PT LONG TERM GOAL #5   Title pt will improve her FOTO score to 41% limitation to demonstrate improved function with less pain at discharge    Time 8   Period Weeks   Status On-going   PT LONG TERM GOAL #6   Title Right hip and knee strength to at least 4+/5 needed for standing/walking for long periods of time for work   Time [place] - 09/10/15 0909    Clinical Impression [place] continues to report no pain. She was able to complete all of todays exercises without complaint except for LAQ with a little bit of soreness in her patellar tendon following exercises. pt requested ice following todays session . plan to progress with strengthening as tolerated.    PT Next Visit Plan continue Nu-step or Bike for ROM;  low level ROM and beginning strengthening open and closed chain (s/p 12 days post-op scope);  cryotherapy for edema control/pain control   Consulted and Agree with Plan of Care Patient        Problem List Patient Active Problem List   Diagnosis Date Noted  . S/P hysterectomy 12/12/2013  . Leiomyoma of body of uterus 04/18/2012   Starr Lake PT, DPT, LAT, ATC  09/10/2015  9:22  AM      [place] [facility] [address] [place], [place], [address] Phone: [phone]   Fax:  [phone]  Name: Naketa Budnick MRN: [id number] Date of Birth: [birth year]

## 2015-09-15 ENCOUNTER — Ambulatory Visit: Payer: 59 | Admitting: Physical Therapy

## 2015-09-17 ENCOUNTER — Ambulatory Visit: Payer: 59 | Admitting: Physical Therapy

## 2015-09-17 DIAGNOSIS — R262 Difficulty in walking, not elsewhere classified: Secondary | ICD-10-CM

## 2015-09-17 DIAGNOSIS — M25561 Pain in right knee: Secondary | ICD-10-CM

## 2015-09-17 DIAGNOSIS — M25461 Effusion, right knee: Secondary | ICD-10-CM

## 2015-09-17 DIAGNOSIS — M24661 Ankylosis, right knee: Secondary | ICD-10-CM | POA: Diagnosis not present

## 2015-09-17 DIAGNOSIS — M6281 Muscle weakness (generalized): Secondary | ICD-10-CM | POA: Diagnosis not present

## 2015-09-17 NOTE — Therapy (Signed)
[place], [place], [address] Phone: [phone]   Fax:  [phone]  Physical Therapy Treatment  Patient Details  Name: Erin Hall MRN: [id number] Date of Birth: 09-16-1960 Referring Provider: Dr. Alvan Dame  Encounter Date: 09/17/2015      PT End of Session - 09/17/15 0904    Visit Number 4   Number of Visits 16   Date for PT Re-Evaluation 10/27/15   PT Start Time 0856  pt arrived 11 minutes late   PT Stop Time 0940   PT Time Calculation (min) 44 min   Activity Tolerance Patient tolerated treatment well   Behavior During Therapy [facility] for tasks assessed/performed      Past Medical History  Diagnosis Date  . Hypothyroidism   . Hyperlipidemia     hx of  . History of seasonal allergies   . History of cellulitis     DOG BITE RIGHT THIGH 2002  . Right knee meniscal tear   . History of uterine fibroid     Past Surgical History  Procedure Laterality Date  . Breast reduction surgery Bilateral 02-08-2007  . Knee surgery Left age 78    osteomalacia  . Uterine artery embolization  04/17/2012  . Robotic assisted total hysterectomy N/A 12/12/2013    Procedure: ROBOTIC ASSISTED TOTAL HYSTERECTOMY;  Surgeon: Marvene Staff, MD;  Location: [facility];  Service: Gynecology;  Laterality: N/A;  . Bilateral salpingectomy N/A 12/12/2013    Procedure: BILATERAL SALPINGECTOMY;  Surgeon: Marvene Staff, MD;  Location: [facility];  Service: Gynecology;  Laterality: N/A;  . Knee arthroscopy Left several -- last one 2008  . I & d right thigh abscess  12-04-2000  . Knee arthroscopy Right 08/20/2015    Procedure: ARTHROSCOPY RIGHT KNEE/ MEDIAL AND LATERAL PARTIAL MENISCECTOMY/ MEDIAL AND LATERAL PARTIAL CHONDROPLASTY/ BILATERAL [place] INJECTION;  Surgeon: Paralee Cancel, MD;  Location: [facility];  Service: Orthopedics;  Laterality: Right;  AND BILATERAL HIPS    There were no vitals filed for this  visit.  Visit Diagnosis:  Decreased range of knee movement, right  Knee swelling, right  Difficulty in walking  Muscle weakness of lower extremity  Right knee pain      Subjective Assessment - 09/17/15 0859    Subjective pt reports she started working 2 - 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s and it swelled up alot, but no pain. she continues to ice. she reports she can't have direct pressure on the knee due to pain.    Currently in Pain? No/denies                         [facility] Adult PT Treatment/Exercise - 09/17/15 0903    Knee/Hip Exercises: Stretches   Active Hamstring Stretch 2 reps;30 seconds   Quad Stretch 2 reps;30 seconds  with strap   Gastroc Stretch 2 reps;30 seconds  with foot on to wall   Other Knee/Hip Stretches ITB 2 x 30 performed bil   Knee/Hip Exercises: Aerobic   Nustep L6 x 10 min  Using LE only   Knee/Hip Exercises: Standing   Step Down Right;2 sets;Step Height: 4";Hand Hold: 1;10 reps  reported soreness int he patellar tendon.    Knee/Hip Exercises: Supine   Bridges with Clamshell AROM;Strengthening;2 sets;15 reps  with green theraband   Straight Leg Raises AROM;Strengthening;15 reps;2 sets   Straight Leg Raises Limitations cueing to keeping knee straight to reset at end range   Cryotherapy  Number Minutes Cryotherapy 10 Minutes   Cryotherapy Location Knee  R knee   Type of Cryotherapy Ice pack                  PT Short Term Goals - 09/17/15 [id number]    PT SHORT TERM GOAL #1   Title pt will be I with inital HEP (09/29/15)   Time 4   Period Weeks   Status On-going   PT SHORT TERM GOAL #2   Title pt will be able to verbalize and demonstrate techniques to reduce bil knee swelling and pain via RICE and HEP (09/29/15)   Time 4   Period Weeks   Status On-going           PT Long Term Goals - 09/17/15 [id number]    PT LONG TERM GOAL #1   Title pt will be I with all HEP given throughout therapy (10/27/15)   Time 8   Period Weeks   Status On-going    PT LONG TERM GOAL #2   Title pt will increase right knee AROM to > 120 - 0 degrees to assist with an efficent gait pattern (10/27/15)   Time 8   Period Weeks   Status On-going   PT LONG TERM GOAL #3   Title pt will be able to stand/ walk > 30 minutes with < 3/10 pain  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   Time 8   Period Weeks   Status On-going   PT [place] #4   Title pt will be bale to navigate up/down > 12 steps with < 3/10 pain to assist with job requirments and community ambulation   Time 8   Period Weeks   Status On-going   PT LONG TERM GOAL #5   Title pt will improve her FOTO score to 41% limitation to demonstrate improved function with less pain at discharge    Time 8   Period Weeks   Status On-going   PT LONG TERM GOAL #6   Title Right hip and knee strength to at least 4+/5 needed for standing/walking for long periods of time for work   Time [place] - 09/17/15 0934    Clinical Impression Statement Erin Hall reports to therapy 11 minutes late today. She returned to work last Sunday and Monday working 2-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s and stated her swelled up but she had no pain. She was able to perfrom and complete all exercises requiring cueing to keep knee straight during  SLR exhibit Quad lag due to fatigue, and during Step downs she reported soreness following exercise at the patellar tendon. pt opted for ice following todays session.    PT Next Visit Plan continue Nu-step,  low level ROM and progress strengthening open and closed chain, cryotherapy for edema control/pain control PRN,   Consulted and Agree with Plan of Care Patient        Problem List Patient Active Problem List   Diagnosis Date Noted  . S/P hysterectomy 12/12/2013  . Leiomyoma of body of uterus 04/18/2012   Starr Lake PT, DPT, LAT, ATC  09/17/2015  9:51 AM      [facility] [address] [place], [place], [address] Phone: [phone]   Fax:  [phone]  Name: Erin Hall MRN: [id number] Date  of Birth: December 02, 1960

## 2015-09-21 DIAGNOSIS — Z803 Family history of malignant neoplasm of breast: Secondary | ICD-10-CM | POA: Diagnosis not present

## 2015-09-21 DIAGNOSIS — Z8049 Family history of malignant neoplasm of other genital organs: Secondary | ICD-10-CM | POA: Diagnosis not present

## 2015-09-21 DIAGNOSIS — Z800 Family history of malignant neoplasm of digestive organs: Secondary | ICD-10-CM | POA: Diagnosis not present

## 2015-09-22 ENCOUNTER — Ambulatory Visit: Payer: 59 | Admitting: Physical Therapy

## 2015-09-22 MED FILL — GAVILYTE-N SOLUTION: 420 | 1 days supply | Qty: 4000 | Fill #0

## 2015-09-23 DIAGNOSIS — Z1211 Encounter for screening for malignant neoplasm of colon: Secondary | ICD-10-CM | POA: Diagnosis not present

## 2015-09-23 DIAGNOSIS — Z800 Family history of malignant neoplasm of digestive organs: Secondary | ICD-10-CM | POA: Diagnosis not present

## 2015-09-24 ENCOUNTER — Ambulatory Visit: Payer: 59 | Attending: Family Medicine | Admitting: Physical Therapy

## 2015-09-24 DIAGNOSIS — M6281 Muscle weakness (generalized): Secondary | ICD-10-CM | POA: Insufficient documentation

## 2015-09-24 DIAGNOSIS — M25461 Effusion, right knee: Secondary | ICD-10-CM | POA: Insufficient documentation

## 2015-09-24 DIAGNOSIS — M25561 Pain in right knee: Secondary | ICD-10-CM | POA: Insufficient documentation

## 2015-09-24 DIAGNOSIS — R262 Difficulty in walking, not elsewhere classified: Secondary | ICD-10-CM | POA: Insufficient documentation

## 2015-09-24 DIAGNOSIS — M24661 Ankylosis, right knee: Secondary | ICD-10-CM | POA: Diagnosis not present

## 2015-09-24 NOTE — Therapy (Signed)
[place], [place], [address] Phone: [phone]   Fax:  [phone]  Physical Therapy Treatment  Patient Details  Name: Erin Hall MRN: [id number] Date of Birth: 1960/09/24 Referring Provider: Dr. Alvan Dame  Encounter Date: 09/24/2015      PT End of Session - 09/24/15 1018    Visit Number 5   Number of Visits 16   Date for PT Re-Evaluation 10/27/15   PT Start Time 0812  pt arrived 12 minutes late today   PT Stop Time 0855   PT Time Calculation (min) 43 min   Activity Tolerance Patient tolerated treatment well   Behavior During Therapy [facility] for tasks assessed/performed      Past Medical History  Diagnosis Date  . Hypothyroidism   . Hyperlipidemia     hx of  . History of seasonal allergies   . History of cellulitis     DOG BITE RIGHT THIGH 2002  . Right knee meniscal tear   . History of uterine fibroid     Past Surgical History  Procedure Laterality Date  . Breast reduction surgery Bilateral 02-08-2007  . Knee surgery Left age 55    osteomalacia  . Uterine artery embolization  04/17/2012  . Robotic assisted total hysterectomy N/A 12/12/2013    Procedure: ROBOTIC ASSISTED TOTAL HYSTERECTOMY;  Surgeon: Marvene Staff, MD;  Location: [facility];  Service: Gynecology;  Laterality: N/A;  . Bilateral salpingectomy N/A 12/12/2013    Procedure: BILATERAL SALPINGECTOMY;  Surgeon: Marvene Staff, MD;  Location: [facility];  Service: Gynecology;  Laterality: N/A;  . Knee arthroscopy Left several -- last one 2008  . I & d right thigh abscess  12-04-2000  . Knee arthroscopy Right 08/20/2015    Procedure: ARTHROSCOPY RIGHT KNEE/ MEDIAL AND LATERAL PARTIAL MENISCECTOMY/ MEDIAL AND LATERAL PARTIAL CHONDROPLASTY/ BILATERAL [place] INJECTION;  Surgeon: Paralee Cancel, MD;  Location: [facility];  Service: Orthopedics;  Laterality: Right;  AND BILATERAL HIPS    There were no vitals filed for this  visit.  Visit Diagnosis:  Decreased range of knee movement, right  Knee swelling, right  Difficulty in walking  Muscle weakness of lower extremity  Right knee pain      Subjective Assessment - 09/24/15 0813    Subjective pt reports still woirking 12's but that it has been feeling a little bit better, and has been able to go a little further and has swelled as much as it had initally. She reports only icing intermittently mostly days she works.    Currently in Pain? No/denies            [facility] PT Assessment - 09/24/15 0001    AROM   Right Knee Extension -3   Right Knee Flexion 114   PROM   Right Knee Flexion 121                     [facility] Adult PT Treatment/Exercise - 09/24/15 0815    Knee/Hip Exercises: Stretches   Active Hamstring Stretch 30 seconds;4 reps   Active Hamstring Stretch Limitations with strap   Quad Stretch 2 reps;30 seconds  prone with strap   Other Knee/Hip Stretches ITB 2 x 30 performed bil   Knee/Hip Exercises: Aerobic   Nustep L6 x 10 min  LE only   Knee/Hip Exercises: Standing   Forward Lunges Both;2 sets;10 reps  touch down onto bosu, cues for    Cryotherapy   Number Minutes Cryotherapy  10 Minutes   Cryotherapy Location Knee  right   Type of Cryotherapy Ice pack   Manual Therapy   Soft tissue mobilization instrument assisted STM over the patellar tendon/ quad tendon                PT Education - 09/24/15 1018    Education provided Yes   Education Details HEp review   Person(s) Educated Patient   Methods Explanation   Comprehension Verbalized understanding          PT Short Term Goals - 09/17/15 0943    PT SHORT TERM GOAL #1   Title pt will be I with inital HEP (55/7/17)   Time 4   Period Weeks   Status On-going   PT SHORT TERM GOAL #2   Title pt will be able to verbalize and demonstrate techniques to reduce bil knee swelling and pain via RICE and HEP (09/29/15)   Time 4   Period Weeks   Status On-going            PT Long Term Goals - 09/17/15 [id number]    PT LONG TERM GOAL #1   Title pt will be I with all HEP given throughout therapy (55/7/17)   Time 8   Period Weeks   Status On-going   PT LONG TERM GOAL #2   Title pt will increase right knee AROM to > 120 - 0 degrees to assist with an efficent gait pattern (10/27/15)   Time 8   Period Weeks   Status On-going   PT LONG TERM GOAL #3   Title pt will be able to stand/ walk > 30 minutes with < 3/10 pain  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   Time 8   Period Weeks   Status On-going   PT [place] #4   Title pt will be bale to navigate up/down > 12 steps with < 3/10 pain to assist with job requirments and community ambulation   Time 8   Period Weeks   Status On-going   PT LONG TERM GOAL #5   Title pt will improve her FOTO score to 41% limitation to demonstrate improved function with less pain at discharge    Time 8   Period Weeks   Status On-going   PT LONG TERM GOAL #6   Title Right hip and knee strength to at least 4+/5 needed for standing/walking for long periods of time for work   Time 8   Period Weeks   Status On-going               Plan - 09/24/15 1020    Clinical Impression Statement Mariaguadalupe was 12 minutes late today. she reports that she is doing better today but still has some soreness and swelling but seems to better managed. she reported some relief of tendnerness in the quad and patellar tendon following instrument asissted STM. She was able to perofrm all exercise but required cueing for form especially with lunges. pt requested ice following todays session to calm down soreness. Plan to progress with strenghtening as tolerated.    PT Next Visit Plan continue Nu-step,  low level ROM and progress strengthening open and closed chain, cryotherapy for edema control/pain control PRN,   PT Home Exercise Plan no new HEP   Consulted and Agree with Plan of Care Patient         Problem List Patient Active Problem List  Diagnosis Date Noted  . S/P hysterectomy 12/12/2013  . Leiomyoma of body of uterus 04/18/2012   Starr Lake PT, DPT, LAT, ATC  09/24/2015  10:23 AM     [facility] [address] [place], [place], [address] Phone: [phone]   Fax:  [phone]  Name: Renly Bertholf MRN: [id number] Date of Birth: 01-22-1961

## 2015-09-29 ENCOUNTER — Ambulatory Visit: Payer: 59 | Admitting: Physical Therapy

## 2015-09-29 DIAGNOSIS — M25561 Pain in right knee: Secondary | ICD-10-CM

## 2015-09-29 DIAGNOSIS — M25461 Effusion, right knee: Secondary | ICD-10-CM

## 2015-09-29 DIAGNOSIS — M6281 Muscle weakness (generalized): Secondary | ICD-10-CM

## 2015-09-29 DIAGNOSIS — M24661 Ankylosis, right knee: Secondary | ICD-10-CM

## 2015-09-29 DIAGNOSIS — R262 Difficulty in walking, not elsewhere classified: Secondary | ICD-10-CM

## 2015-09-29 MED FILL — LEVOTHYROXINE 75 MCG TABLET: 75 | 90 days supply | Qty: 90 | Fill #3

## 2015-09-29 NOTE — Therapy (Signed)
[place], [place], [address] Phone: [phone]   Fax:  [phone]  Physical Therapy Treatment  Patient Details  Name: Erin Hall MRN: [id number] Date of Birth: [birth year] Referring Provider: Dr. Alvan Dame  Encounter Date: 09/29/2015      PT End of Session - 09/29/15 1205    Visit Number 6   Number of Visits 16   Date for PT Re-Evaluation 10/27/15   PT Start Time 0815  pt arrived 15 minutes late today   PT Stop Time 0855   PT Time Calculation (min) 40 min   Activity Tolerance Patient tolerated treatment well   Behavior During Therapy [facility] for tasks assessed/performed      Past Medical History  Diagnosis Date  . Hypothyroidism   . Hyperlipidemia     hx of  . History of seasonal allergies   . History of cellulitis     DOG BITE RIGHT THIGH 2002  . Right knee meniscal tear   . History of uterine fibroid     Past Surgical History  Procedure Laterality Date  . Breast reduction surgery Bilateral 02-08-2007  . Knee surgery Left age 69    osteomalacia  . Uterine artery embolization  04/17/2012  . Robotic assisted total hysterectomy N/A 12/12/2013    Procedure: ROBOTIC ASSISTED TOTAL HYSTERECTOMY;  Surgeon: Marvene Staff, MD;  Location: [facility];  Service: Gynecology;  Laterality: N/A;  . Bilateral salpingectomy N/A 12/12/2013    Procedure: BILATERAL SALPINGECTOMY;  Surgeon: Marvene Staff, MD;  Location: [facility];  Service: Gynecology;  Laterality: N/A;  . Knee arthroscopy Left several -- last one 2008  . I & d right thigh abscess  12-04-2000  . Knee arthroscopy Right 08/20/2015    Procedure: ARTHROSCOPY RIGHT KNEE/ MEDIAL AND LATERAL PARTIAL MENISCECTOMY/ MEDIAL AND LATERAL PARTIAL CHONDROPLASTY/ BILATERAL [place] INJECTION;  Surgeon: Paralee Cancel, MD;  Location: [place];  Service: Orthopedics;  Laterality: Right;  AND BILATERAL HIPS    There were no vitals filed for this  visit.  Visit Diagnosis:  Decreased range of knee movement, right  Knee swelling, right  Difficulty in walking  Muscle weakness of lower extremity  Right knee pain      Subjective Assessment - 09/29/15 0816    Subjective "I am feeling more sore today and has been going on since over the weekend and working saturday"  pt reports she thinks maybe she returned to work to early or is doing too much.    Currently in Pain? Yes   Pain Score 4   at its worst it gets to 8-9/10   Pain Location Knee   Pain Orientation Right   Pain Type Chronic pain   Pain Onset More than a month ago   Pain Frequency Constant   Aggravating Factors  prolonged standing/ stairs,    Pain Relieving Factors ice,                          [facility] Adult PT Treatment/Exercise - 09/29/15 0824    Knee/Hip Exercises: Stretches   Active Hamstring Stretch 30 seconds;4 reps  performed only on R   Other Knee/Hip Stretches ITB 2 x 30 performed only on R   Knee/Hip Exercises: Aerobic   Recumbent Bike L2 x 8 min   Cryotherapy   Number Minutes Cryotherapy 10 Minutes   Cryotherapy Location Knee   Type of Cryotherapy Ice pack   Manual Therapy  Manual Therapy Joint mobilization   Joint Mobilization grade 2 patellar mobs in all directions    Soft tissue mobilization instrument assisted STM over the patellar tendon/ quad tendon   Myofascial Release edema reduction anterograde massage at the knee                PT Education - 09/29/15 1205    Education provided Yes   Education Details massage to decrease swelling   Person(s) Educated Patient   Methods Explanation   Comprehension Verbalized understanding          PT Short Term Goals - 09/29/15 1208    PT SHORT TERM GOAL #1   Title pt will be I with inital HEP (09/29/15)   Time 4   Period Weeks   Status Achieved   PT SHORT TERM GOAL #2   Title pt will be able to verbalize and demonstrate techniques to reduce bil knee swelling and pain  via RICE and HEP (09/29/15)   Time 4   Period Weeks   Status Achieved           PT Long Term Goals - 09/17/15 4920    PT LONG TERM GOAL #1   Title pt will be I with all HEP given throughout therapy (10/27/15)   Time 8   Period Weeks   Status On-going   PT LONG TERM GOAL #2   Title pt will increase right knee AROM to > 120 - 0 degrees to assist with an efficent gait pattern (10/27/15)   Time 8   Period Weeks   Status On-going   PT LONG TERM GOAL #3   Title pt will be able to stand/ walk > 30 minutes with < 3/10 pain  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   Time 8   Period Weeks   Status On-going   PT [place] #4   Title pt will be bale to navigate up/down > 12 steps with < 3/10 pain to assist with job requirments and community ambulation   Time 8   Period Weeks   Status On-going   PT LONG TERM GOAL #5   Title pt will improve her FOTO score to 41% limitation to demonstrate improved function with less pain at discharge    Time 8   Period Weeks   Status On-going   PT LONG TERM GOAL #6   Title Right hip and knee strength to at least 4+/5 needed for standing/walking for long periods of time for work   Time 8   Period Weeks   Status On-going               Plan - 09/29/15 1206    Clinical Impression Statement [name] arrived 15 minutes to todays session. She reported having more swelling and soreness today. due to pt arriving late and increased soreness focused todays session edema reducation and pain relief. Following manual STM and hamstring/ R ITB stretching she reported no pain. pt met all STGs today. utilized ice following todays session with elevation per pt request.    PT Next Visit Plan continue Nu-step,  low level ROM and progress strengthening open and closed chain, cryotherapy for edema control/pain control PRN,   PT Home Exercise Plan No new HEP   Consulted and Agree with Plan of Care Patient        Problem  List Patient Active Problem List   Diagnosis Date Noted  . S/P hysterectomy  12/12/2013  . Leiomyoma of body of uterus 04/18/2012   Starr Lake PT, DPT, LAT, ATC  09/29/2015  12:12 PM     [facility] [address] [place], [place], [address] Phone: [phone]   Fax:  [phone]  Name: Erin Hall MRN: [id number] Date of Birth: 01-12-1961

## 2015-10-01 ENCOUNTER — Ambulatory Visit: Payer: 59 | Admitting: Physical Therapy

## 2015-10-01 DIAGNOSIS — M25461 Effusion, right knee: Secondary | ICD-10-CM | POA: Diagnosis not present

## 2015-10-01 DIAGNOSIS — M25561 Pain in right knee: Secondary | ICD-10-CM | POA: Diagnosis not present

## 2015-10-01 DIAGNOSIS — M6281 Muscle weakness (generalized): Secondary | ICD-10-CM | POA: Diagnosis not present

## 2015-10-01 DIAGNOSIS — M24661 Ankylosis, right knee: Secondary | ICD-10-CM | POA: Diagnosis not present

## 2015-10-01 DIAGNOSIS — R262 Difficulty in walking, not elsewhere classified: Secondary | ICD-10-CM | POA: Diagnosis not present

## 2015-10-01 NOTE — Patient Instructions (Signed)
   Carime Dinkel PT, DPT, LAT, ATC  [facility] Phone: [phone]     

## 2015-10-01 NOTE — Therapy (Signed)
[place], [place], [address] Phone: [phone]   Fax:  [phone]  Physical Therapy Treatment  Patient Details  Name: Erin Hall MRN: [id number] Date of Birth: 05/31/1961 Referring Provider: Dr. Alvan Dame  Encounter Date: 10/01/2015      PT End of Session - 10/01/15 0845    Visit Number 7   Number of Visits 16   Date for PT Re-Evaluation 10/27/15   PT Start Time 0808   PT Stop Time 0856   PT Time Calculation (min) 48 min   Activity Tolerance Patient tolerated treatment well   Behavior During Therapy [facility] for tasks assessed/performed      Past Medical History  Diagnosis Date  . Hypothyroidism   . Hyperlipidemia     hx of  . History of seasonal allergies   . History of cellulitis     DOG BITE RIGHT THIGH 2002  . Right knee meniscal tear   . History of uterine fibroid     Past Surgical History  Procedure Laterality Date  . Breast reduction surgery Bilateral 02-08-2007  . Knee surgery Left age 60    osteomalacia  . Uterine artery embolization  04/17/2012  . Robotic assisted total hysterectomy N/A 12/12/2013    Procedure: ROBOTIC ASSISTED TOTAL HYSTERECTOMY;  Surgeon: Marvene Staff, MD;  Location: [facility];  Service: Gynecology;  Laterality: N/A;  . Bilateral salpingectomy N/A 12/12/2013    Procedure: BILATERAL SALPINGECTOMY;  Surgeon: Marvene Staff, MD;  Location: [facility];  Service: Gynecology;  Laterality: N/A;  . Knee arthroscopy Left several -- last one 2008  . I & d right thigh abscess  12-04-2000  . Knee arthroscopy Right 08/20/2015    Procedure: ARTHROSCOPY RIGHT KNEE/ MEDIAL AND LATERAL PARTIAL MENISCECTOMY/ MEDIAL AND LATERAL PARTIAL CHONDROPLASTY/ BILATERAL [place] INJECTION;  Surgeon: Paralee Cancel, MD;  Location: [place];  Service: Orthopedics;  Laterality: Right;  AND BILATERAL HIPS    There were no vitals filed for this visit.  Visit Diagnosis:  Decreased  range of knee movement, right  Knee swelling, right  Difficulty in walking  Muscle weakness of lower extremity  Right knee pain      Subjective Assessment - 10/01/15 0815    Subjective "I am feeling much better today, last session really calmed the pain down today" pt reports buying a compression sleeve and that it does seem to help with swelling. She states even though she has been off work she has still been active.    Currently in Pain? Yes   Pain Score 1    Pain Location Knee   Pain Orientation Right   Pain Descriptors / Indicators Aching  sharp occasionally   Pain Onset More than a month ago   Pain Frequency Intermittent                         [facility] Adult PT Treatment/Exercise - 10/01/15 0820    Knee/Hip Exercises: Stretches   Active Hamstring Stretch 2 reps;30 seconds   Active Hamstring Stretch Limitations with strap   Quad Stretch 2 reps;30 seconds   Other Knee/Hip Stretches ITB 1 x 30 performed bil   Knee/Hip Exercises: Aerobic   Recumbent Bike L2 x 8 min   Knee/Hip Exercises: Standing   Forward Lunges Both;2 sets;10 reps  with bosu ball   Lateral Step Up 2 sets;Step Height: 6";10 reps   Lateral Step Up Limitations soreness in the patellar tendon, following  quad stretching she reported relief of soreness   Forward Step Up 2 sets;10 reps;Step Height: 6"  some soreness in the R knee with step   Step Down Right;2 sets;10 reps;Step Height: 6"   Knee/Hip Exercises: Supine   Bridges with Clamshell AROM;Strengthening;2 sets;10 reps  cues to keep hips up during abduction   Cryotherapy   Number Minutes Cryotherapy 10 Minutes   Cryotherapy Location Knee   Type of Cryotherapy Ice pack                PT Education - 10/01/15 0844    Education provided Yes   Education Details updated HEP   Person(s) Educated Patient   Methods Explanation   Comprehension Verbalized understanding          PT Short Term Goals - 09/29/15 1208    PT SHORT  TERM GOAL #1   Title pt will be I with inital HEP (09/29/15)   Time 4   Period Weeks   Status Achieved   PT SHORT TERM GOAL #2   Title pt will be able to verbalize and demonstrate techniques to reduce bil knee swelling and pain via RICE and HEP (09/29/15)   Time 4   Period Weeks   Status Achieved           PT Long Term Goals - 09/17/15 [id number]    PT LONG TERM GOAL #1   Title pt will be I with all HEP given throughout therapy (10/27/15)   Time 8   Period Weeks   Status On-going   PT LONG TERM GOAL #2   Title pt will increase right knee AROM to > 120 - 0 degrees to assist with an efficent gait pattern (10/27/15)   Time 8   Period Weeks   Status On-going   PT LONG TERM GOAL #3   Title pt will be able to stand/ walk > 30 minutes with < 3/10 pain  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   Time 8   Period Weeks   Status On-going   PT [place] #4   Title pt will be bale to navigate up/down > 12 steps with < 3/10 pain to assist with job requirments and community ambulation   Time 8   Period Weeks   Status On-going   PT LONG TERM GOAL #5   Title pt will improve her FOTO score to 41% limitation to demonstrate improved function with less pain at discharge    Time 8   Period Weeks   Status On-going   PT LONG TERM GOAL #6   Title Right hip and knee strength to at least 4+/5 needed for standing/walking for long periods of time for work   Time 8   Period Weeks   Status On-going               Plan - 10/01/15 0915    Clinical Impression Statement Erin Hall arrived 8 minutes late today. She reports that the pain is doing better today compared to last session. she was able to complete the exercises given with some report of sorness during step-ups and lateral step at the patellar tendon, with quad stretching she reported relief of tightness. utilized ice following todays session to calm down any additional soreness. Plan to assess how pt doess  following a couple of days of working and progress exercises as tolerated.    PT Next Visit Plan continue Nu-step,  low level  ROM and progress strengthening open and closed chain, cryotherapy for edema control/pain control PRN,   PT Home Exercise Plan quad stretching, hip flexor stretching.    Consulted and Agree with Plan of Care Patient        Problem List Patient Active Problem List   Diagnosis Date Noted  . S/P hysterectomy 12/12/2013  . Leiomyoma of body of uterus 04/18/2012   Starr Lake PT, DPT, LAT, ATC  10/01/2015  9:30 AM      [place], [place], [address] Phone: [phone]   Fax:  [phone]  Name: Erin Hall MRN: [id number] Date of Birth: 03-20-1961

## 2015-10-06 ENCOUNTER — Ambulatory Visit: Payer: 59 | Admitting: Physical Therapy

## 2015-10-08 ENCOUNTER — Ambulatory Visit: Payer: 59 | Admitting: Physical Therapy

## 2015-10-08 DIAGNOSIS — R262 Difficulty in walking, not elsewhere classified: Secondary | ICD-10-CM | POA: Diagnosis not present

## 2015-10-08 DIAGNOSIS — M25561 Pain in right knee: Secondary | ICD-10-CM

## 2015-10-08 DIAGNOSIS — M25461 Effusion, right knee: Secondary | ICD-10-CM | POA: Diagnosis not present

## 2015-10-08 DIAGNOSIS — M24661 Ankylosis, right knee: Secondary | ICD-10-CM | POA: Diagnosis not present

## 2015-10-08 DIAGNOSIS — M6281 Muscle weakness (generalized): Secondary | ICD-10-CM

## 2015-10-08 NOTE — Therapy (Signed)
[place], [place], [address] Phone: [phone]   Fax:  [phone]  Physical Therapy Treatment  Patient Details  Name: Erin Hall MRN: [id number] Date of Birth: 1961-06-06 Referring Provider: Dr. Alvan Dame  Encounter Date: 10/08/2015      PT End of Session - 10/08/15 0846    Visit Number 8   Number of Visits 16   Date for PT Re-Evaluation 10/27/15   PT Start Time 0815   PT Stop Time [id number]   PT Time Calculation (min) 40 min   Activity Tolerance Patient tolerated treatment well   Behavior During Therapy [facility] for tasks assessed/performed      Past Medical History  Diagnosis Date  . Hypothyroidism   . Hyperlipidemia     hx of  . History of seasonal allergies   . History of cellulitis     DOG BITE RIGHT THIGH 2002  . Right knee meniscal tear   . History of uterine fibroid     Past Surgical History  Procedure Laterality Date  . Breast reduction surgery Bilateral 02-08-2007  . Knee surgery Left age 33    osteomalacia  . Uterine artery embolization  04/17/2012  . Robotic assisted total hysterectomy N/A 12/12/2013    Procedure: ROBOTIC ASSISTED TOTAL HYSTERECTOMY;  Surgeon: Marvene Staff, MD;  Location: [facility];  Service: Gynecology;  Laterality: N/A;  . Bilateral salpingectomy N/A 12/12/2013    Procedure: BILATERAL SALPINGECTOMY;  Surgeon: Marvene Staff, MD;  Location: [facility];  Service: Gynecology;  Laterality: N/A;  . Knee arthroscopy Left several -- last one 2008  . I & d right thigh abscess  12-04-2000  . Knee arthroscopy Right 08/20/2015    Procedure: ARTHROSCOPY RIGHT KNEE/ MEDIAL AND LATERAL PARTIAL MENISCECTOMY/ MEDIAL AND LATERAL PARTIAL CHONDROPLASTY/ BILATERAL [place] INJECTION;  Surgeon: Paralee Cancel, MD;  Location: [place];  Service: Orthopedics;  Laterality: Right;  AND BILATERAL HIPS    There were no vitals filed for this visit.  Visit Diagnosis:  Decreased  range of knee movement, right  Knee swelling, right  Muscle weakness of lower extremity  Difficulty in walking  Right knee pain      Subjective Assessment - 10/08/15 0820    Subjective "The knee is getting better, I still have some intermittent swelling and some soreness in the knee at the patellar tendon"    Currently in Pain? No/denies                         [facility] Adult PT Treatment/Exercise - 10/08/15 0822    Knee/Hip Exercises: Stretches   Active Hamstring Stretch 2 reps;30 seconds   Hip Flexor Stretch 2 reps;30 seconds   Other Knee/Hip Stretches ITB 2 x 30 performed R side only   Knee/Hip Exercises: Aerobic   Elliptical L1 x 5 min   Knee/Hip Exercises: Machines for Strengthening   Cybex Knee Extension 10# 1 x 10 up with both down with RLE only   Knee/Hip Exercises: Standing   Lateral Step Up Step Height: 4"   Forward Step Up 2 sets;10 reps;Step Height: 4"   Step Down Both;2 sets;Step Height: 4";10 reps   SLS with Vectors bil with red theraband x 10 each with abduction/ flexion/ extension   Knee/Hip Exercises: Seated   Sit to Sand 2 sets;10 reps  cues for good for form touching down on to table   Cryotherapy   Number Minutes Cryotherapy 10 Minutes  Cryotherapy Location Knee   Type of Cryotherapy Ice pack  in supine                  PT Short Term Goals - 09/29/15 1208    PT SHORT TERM GOAL #1   Title pt will be I with inital HEP (09/29/15)   Time 4   Period Weeks   Status Achieved   PT SHORT TERM GOAL #2   Title pt will be able to verbalize and demonstrate techniques to reduce bil knee swelling and pain via RICE and HEP (09/29/15)   Time 4   Period Weeks   Status Achieved           PT Long Term Goals - 10/08/15 0850    PT LONG TERM GOAL #1   Title pt will be I with all HEP given throughout therapy (10/27/15)   Time 8   Period Weeks   Status On-going   PT LONG TERM GOAL #2   Title pt will increase right knee AROM to > 120 - 0  degrees to assist with an efficent gait pattern (10/27/15)   Time 8   Period Weeks   Status On-going   PT LONG TERM GOAL #3   Title pt will be able to stand/ walk > 30 minutes with < 3/10 pain  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   Time 8   Period Weeks   Status On-going   PT [place] #4   Title pt will be bale to navigate up/down > 12 steps with < 3/10 pain to assist with job requirments and community ambulation   Time 8   Period Weeks   Status On-going   PT LONG TERM GOAL #5   Title pt will improve her FOTO score to 41% limitation to demonstrate improved function with less pain at discharge    Time 8   Period Weeks   Status On-going   PT LONG TERM GOAL #6   Title Right hip and knee strength to at least 4+/5 needed for standing/walking for long periods of time for work   Time [place] - 10/08/15 0847    Clinical Impression Statement Erin Hall came in today in stead of coming Friday due to a schedule mix up and was able to fit her in today. focused on eccentric quad strengthening and standing hip strengthening which she reported some soreness but was able to complete the exercise. utlized Ice for her knee to calm down any additional soreness following todays session.   PT Next Visit Plan ellipitcal,  strengthening open and closed chain, cryotherapy for edema control/pain control PRN, begin static balance training.    PT Home Exercise Plan no new HEP   Consulted and Agree with Plan of Care Patient        Problem List Patient Active Problem List   Diagnosis Date Noted  . S/P hysterectomy 12/12/2013  . Leiomyoma of body of uterus 04/18/2012   Starr Lake PT, DPT, LAT, ATC  10/08/2015  8:55 AM     [facility] [address] [place], [place], [address] Phone: [phone]   Fax:  [phone]  Name: Erin Hall MRN:  [id number] Date of Birth: [birth year]

## 2015-10-08 NOTE — Therapy (Signed)
[place], [place], [address] Phone: [phone]   Fax:  [phone]  Patient Details  Name: Erin Hall MRN: [id number] Date of Birth: [birth year] Referring Provider:  [name], MD  Encounter Date: 10/08/2015   Error- arrived - no charge   Dorene Ar, [place] 10/08/2015, 8:38 AM  [facility] [address] [place], [place], [address] Phone: [phone]   Fax:  [phone]

## 2015-10-09 ENCOUNTER — Ambulatory Visit: Payer: 59 | Admitting: Physical Therapy

## 2015-10-13 ENCOUNTER — Ambulatory Visit: Payer: 59 | Admitting: Physical Therapy

## 2015-10-13 DIAGNOSIS — M6281 Muscle weakness (generalized): Secondary | ICD-10-CM

## 2015-10-13 DIAGNOSIS — R262 Difficulty in walking, not elsewhere classified: Secondary | ICD-10-CM | POA: Diagnosis not present

## 2015-10-13 DIAGNOSIS — M25561 Pain in right knee: Secondary | ICD-10-CM

## 2015-10-13 DIAGNOSIS — M24661 Ankylosis, right knee: Secondary | ICD-10-CM | POA: Diagnosis not present

## 2015-10-13 DIAGNOSIS — M25461 Effusion, right knee: Secondary | ICD-10-CM | POA: Diagnosis not present

## 2015-10-13 NOTE — Therapy (Signed)
[place], [place], [address] Phone: [phone]   Fax:  [phone]  Physical Therapy Treatment  Patient Details  Name: Erin Hall MRN: [id number] Date of Birth: Feb 28, 1961 Referring Provider: Dr. Alvan Dame  Encounter Date: 10/13/2015      PT End of Session - 10/13/15 1025    Visit Number 9   Number of Visits 16   Date for PT Re-Evaluation 10/27/15   Authorization Type [employer]   PT Start Time 0850   PT Stop Time 0940   PT Time Calculation (min) 50 min   Activity Tolerance Patient tolerated treatment well   Behavior During Therapy [facility] for tasks assessed/performed      Past Medical History  Diagnosis Date  . Hypothyroidism   . Hyperlipidemia     hx of  . History of seasonal allergies   . History of cellulitis     DOG BITE RIGHT THIGH 2002  . Right knee meniscal tear   . History of uterine fibroid     Past Surgical History  Procedure Laterality Date  . Breast reduction surgery Bilateral 02-08-2007  . Knee surgery Left age 52    osteomalacia  . Uterine artery embolization  04/17/2012  . Robotic assisted total hysterectomy N/A 12/12/2013    Procedure: ROBOTIC ASSISTED TOTAL HYSTERECTOMY;  Surgeon: Marvene Staff, MD;  Location: [facility];  Service: Gynecology;  Laterality: N/A;  . Bilateral salpingectomy N/A 12/12/2013    Procedure: BILATERAL SALPINGECTOMY;  Surgeon: Marvene Staff, MD;  Location: [facility];  Service: Gynecology;  Laterality: N/A;  . Knee arthroscopy Left several -- last one 2008  . I & d right thigh abscess  12-04-2000  . Knee arthroscopy Right 08/20/2015    Procedure: ARTHROSCOPY RIGHT KNEE/ MEDIAL AND LATERAL PARTIAL MENISCECTOMY/ MEDIAL AND LATERAL PARTIAL CHONDROPLASTY/ BILATERAL [place] INJECTION;  Surgeon: Paralee Cancel, MD;  Location: [facility];  Service: Orthopedics;  Laterality: Right;  AND BILATERAL HIPS    There were no vitals filed for this  visit.  Visit Diagnosis:  Decreased range of knee movement, right  Muscle weakness of lower extremity  Knee swelling, right  Difficulty in walking  Right knee pain      Subjective Assessment - 10/13/15 0854    Subjective " been having more trouble in the knee, with increased tenderness i nthe back of the and soreness in the hips especially in the last 3 days" she reports having more pain inthe joint   Currently in Pain? Yes   Pain Score 5    Pain Location Knee   Pain Orientation Right;Anterior;Posterior   Pain Descriptors / Indicators Aching;Sharp   Pain Onset More than a month ago   Pain Frequency Intermittent   Aggravating Factors  prolonged standing/ stairs    Pain Relieving Factors ice but is too worn out to ice at home                         [facility] Adult PT Treatment/Exercise - 10/13/15 0859    Self-Care   Self-Care Other Self-Care Comments   Other Self-Care Comments  edema reduction massage to reduce swelling with the knee elevated. Do the exercises and stretches to help decrease tightness pt reports not doing them lately due to being exhausted.    Knee/Hip Exercises: Stretches   Active Hamstring Stretch 2 reps;30 seconds   Other Knee/Hip Stretches ITB 2 x 30 performed R side only  Knee/Hip Exercises: Aerobic   Nustep L5 x 8 min  opted for Nu-step today due to increased pain   Cryotherapy   Number Minutes Cryotherapy 10 Minutes   Cryotherapy Location Knee   Type of Cryotherapy Ice pack  in supine   Manual Therapy   Soft tissue mobilization instrument assisted STM over the patellar tendon/ quad tendon, and hamstring tendons   Myofascial Release edema reduction anterograde massage at the knee  educated how to perform at home to reduce swelling                PT Education - 10/13/15 1024    Education provided Yes   Education Details reviewed HEP and educated imporance of doing them    Person(s) Educated Patient   Methods Explanation    Comprehension Verbalized understanding          PT Short Term Goals - 09/29/15 1208    PT SHORT TERM GOAL #1   Title pt will be I with inital HEP (09/29/15)   Time 4   Period Weeks   Status Achieved   PT SHORT TERM GOAL #2   Title pt will be able to verbalize and demonstrate techniques to reduce bil knee swelling and pain via RICE and HEP (09/29/15)   Time 4   Period Weeks   Status Achieved           PT Long Term Goals - 10/08/15 0850    PT LONG TERM GOAL #1   Title pt will be I with all HEP given throughout therapy (10/27/15)   Time 8   Period Weeks   Status On-going   PT LONG TERM GOAL #2   Title pt will increase right knee AROM to > 120 - 0 degrees to assist with an efficent gait pattern (10/27/15)   Time 8   Period Weeks   Status On-going   PT LONG TERM GOAL #3   Title pt will be able to stand/ walk > 30 minutes with < 3/10 pain  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   Time 8   Period Weeks   Status On-going   PT [place] #4   Title pt will be bale to navigate up/down > 12 steps with < 3/10 pain to assist with job requirments and community ambulation   Time 8   Period Weeks   Status On-going   PT LONG TERM GOAL #5   Title pt will improve her FOTO score to 41% limitation to demonstrate improved function with less pain at discharge    Time 8   Period Weeks   Status On-going   PT LONG TERM GOAL #6   Title Right hip and knee strength to at least 4+/5 needed for standing/walking for long periods of time for work   Time 8   Period Weeks   Status On-going               Plan - 10/13/15 [place] presents to therapy today with report that she is having more pain in the posterior and medial aspect of the knee. upon further assessment she has soreness at the insertion of the hamstrings. Focused todays session reducing pain. with stretching and instrument assisted STM and edema  reducation massage. discussed her exercises and educated importance of doing her exercises inorder to make progress and icing as needed at home to help control pain and inflammation,  she reported she will work harder on doing her HEP. used ICe to calm down additional sorness today, post session she reported pain to 3/10.    PT Next Visit Plan ellipitcal,  strengthening open and closed chain, cryotherapy for edema control/pain control PRN, begin static balance training.    PT Home Exercise Plan reviewed HEP, added standing/ seated hamstring stretching, crossed leg hamstring stretch   Consulted and Agree with Plan of Care Patient        Problem List Patient Active Problem List   Diagnosis Date Noted  . S/P hysterectomy 12/12/2013  . Leiomyoma of body of uterus 04/18/2012   [place] PT, DPT, LAT, ATC  10/13/2015  10:36 AM     [facility] [address] [place], [place], [address] Phone: [phone]   Fax:  [phone]  Name: Erin Hall MRN: [id number] Date of Birth: 1960-11-21

## 2015-10-15 ENCOUNTER — Ambulatory Visit: Payer: 59 | Admitting: Physical Therapy

## 2015-10-20 ENCOUNTER — Encounter: Payer: 59 | Admitting: Physical Therapy

## 2015-10-22 ENCOUNTER — Ambulatory Visit: Payer: 59 | Attending: Family Medicine | Admitting: Physical Therapy

## 2015-10-22 DIAGNOSIS — M25562 Pain in left knee: Secondary | ICD-10-CM | POA: Insufficient documentation

## 2015-10-22 DIAGNOSIS — R262 Difficulty in walking, not elsewhere classified: Secondary | ICD-10-CM | POA: Insufficient documentation

## 2015-10-22 DIAGNOSIS — M24662 Ankylosis, left knee: Secondary | ICD-10-CM | POA: Insufficient documentation

## 2015-10-22 DIAGNOSIS — M6281 Muscle weakness (generalized): Secondary | ICD-10-CM | POA: Insufficient documentation

## 2015-10-22 DIAGNOSIS — M25561 Pain in right knee: Secondary | ICD-10-CM | POA: Insufficient documentation

## 2015-10-22 DIAGNOSIS — M24661 Ankylosis, right knee: Secondary | ICD-10-CM | POA: Insufficient documentation

## 2015-10-22 DIAGNOSIS — M25461 Effusion, right knee: Secondary | ICD-10-CM | POA: Insufficient documentation

## 2015-10-22 DIAGNOSIS — R269 Unspecified abnormalities of gait and mobility: Secondary | ICD-10-CM | POA: Insufficient documentation

## 2015-10-27 ENCOUNTER — Ambulatory Visit: Payer: 59 | Admitting: Physical Therapy

## 2015-10-27 DIAGNOSIS — M24661 Ankylosis, right knee: Secondary | ICD-10-CM

## 2015-10-27 DIAGNOSIS — M25561 Pain in right knee: Secondary | ICD-10-CM

## 2015-10-27 DIAGNOSIS — R262 Difficulty in walking, not elsewhere classified: Secondary | ICD-10-CM | POA: Diagnosis not present

## 2015-10-27 DIAGNOSIS — M6281 Muscle weakness (generalized): Secondary | ICD-10-CM | POA: Diagnosis not present

## 2015-10-27 DIAGNOSIS — M25562 Pain in left knee: Secondary | ICD-10-CM | POA: Diagnosis not present

## 2015-10-27 DIAGNOSIS — M25461 Effusion, right knee: Secondary | ICD-10-CM | POA: Diagnosis not present

## 2015-10-27 DIAGNOSIS — R269 Unspecified abnormalities of gait and mobility: Secondary | ICD-10-CM | POA: Diagnosis not present

## 2015-10-27 DIAGNOSIS — M24662 Ankylosis, left knee: Secondary | ICD-10-CM | POA: Diagnosis not present

## 2015-10-27 NOTE — Therapy (Signed)
[place], [place], [address] Phone: [phone]   Fax:  [phone]  Physical Therapy Treatment / Re-certification  Patient Details  Name: Erin Hall MRN: [id number] Date of Birth: Aug 19, 1961 Referring Provider: Dr. Alvan Dame  Encounter Date: 10/27/2015      PT End of Session - 10/27/15 1740    Visit Number 10   Number of Visits 18   Date for PT Re-Evaluation 11/24/15   PT Start Time 1630   PT Stop Time [id number]   PT Time Calculation (min) 45 min   Activity Tolerance Patient tolerated treatment well;Patient limited by pain   Behavior During Therapy [facility] for tasks assessed/performed      Past Medical History  Diagnosis Date  . Hypothyroidism   . Hyperlipidemia     hx of  . History of seasonal allergies   . History of cellulitis     DOG BITE RIGHT THIGH 2002  . Right knee meniscal tear   . History of uterine fibroid     Past Surgical History  Procedure Laterality Date  . Breast reduction surgery Bilateral 02-08-2007  . Knee surgery Left age 59    osteomalacia  . Uterine artery embolization  04/17/2012  . Robotic assisted total hysterectomy N/A 12/12/2013    Procedure: ROBOTIC ASSISTED TOTAL HYSTERECTOMY;  Surgeon: Marvene Staff, MD;  Location: [facility];  Service: Gynecology;  Laterality: N/A;  . Bilateral salpingectomy N/A 12/12/2013    Procedure: BILATERAL SALPINGECTOMY;  Surgeon: Marvene Staff, MD;  Location: [facility];  Service: Gynecology;  Laterality: N/A;  . Knee arthroscopy Left several -- last one 2008  . I & d right thigh abscess  12-04-2000  . Knee arthroscopy Right 08/20/2015    Procedure: ARTHROSCOPY RIGHT KNEE/ MEDIAL AND LATERAL PARTIAL MENISCECTOMY/ MEDIAL AND LATERAL PARTIAL CHONDROPLASTY/ BILATERAL [place] INJECTION;  Surgeon: Paralee Cancel, MD;  Location: [facility];  Service: Orthopedics;  Laterality: Right;  AND BILATERAL HIPS    There were no vitals filed for  this visit.  Visit Diagnosis:  Decreased range of knee movement, right  Muscle weakness of lower extremity  Knee swelling, right  Difficulty in walking  Right knee pain      Subjective Assessment - 10/27/15 1638    Subjective "I am having more trouble with the knee and it has been feeling more pain in the joint and soreness in the back of the knee"    Currently in Pain? Yes   Pain Score 4    Pain Location Knee   Pain Orientation Right;Anterior;Posterior   Pain Descriptors / Indicators Aching;Sharp   Pain Type Chronic pain   Pain Onset More than a month ago   Pain Frequency Intermittent  work   Aggravating Factors  prolonged standing/ stairs   Pain Relieving Factors ice (doesn't help)             [facility] PT Assessment - 10/27/15 1642    Observation/Other Assessments   Focus on Therapeutic Outcomes (FOTO)  58% limited   AROM   Right Knee Extension -6   Right Knee Flexion 106   PROM   Right Knee Extension 0   Right Knee Flexion 116   Strength   Right Knee Flexion 4/5   Right Knee Extension 4/5                     [facility] Adult PT Treatment/Exercise - 10/27/15 1748    Knee/Hip Exercises: Stretches  Active Hamstring Stretch 2 reps;30 seconds   Other Knee/Hip Stretches ITB 2 x 30 performed R side only   Knee/Hip Exercises: Aerobic   Nustep L5 x 5 min   Iontophoresis   Type of Iontophoresis Dexamethasone   Location right medial knee   Dose 1 ml   Time 6 hours   Manual Therapy   Soft tissue mobilization instrument assisted STM over the patellar tendon and quad tendon and along the medial aspect of the knee                  PT Short Term Goals - 09/29/15 1208    PT SHORT TERM GOAL #1   Title pt will be I with inital HEP (09/29/15)   Time 4   Period Weeks   Status Achieved   PT SHORT TERM GOAL #2   Title pt will be able to verbalize and demonstrate techniques to reduce bil knee swelling and pain via RICE and HEP (09/29/15)   Time 4    Period Weeks   Status Achieved           PT Long Term Goals - 10/27/15 1742    PT LONG TERM GOAL #1   Title pt will be I with all HEP given throughout therapy (10/27/15)   Time 8   Period Weeks   Status On-going   PT LONG TERM GOAL #2   Title pt will increase right knee AROM to > 120 - 0 degrees to assist with an efficent gait pattern (10/27/15)   Time 8   Period Weeks   Status On-going   PT LONG TERM GOAL #3   Title pt will be able to stand/ walk > 30 minutes with < 3/10 pain  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   Time 8   Period Weeks   Status On-going   PT [place] #4   Title pt will be bale to navigate up/down > 12 steps with < 3/10 pain to assist with job requirments and community ambulation   Time 8   Period Weeks   Status On-going   PT LONG TERM GOAL #5   Title pt will improve her FOTO score to 41% limitation to demonstrate improved function with less pain at discharge    Time 8   Period Weeks   Status On-going   PT LONG TERM GOAL #6   Title Right hip and knee strength to at least 4+/5 needed for standing/walking for long periods of time for work   Time 8   Period Weeks   Status On-going               Plan - 10/27/15 1740    Clinical Impression Statement Erin Hall reported having increased pain inthe knee in a "C" pattern wrapping fron the top of the patella going medial and wrapping below the patella. She states her pain continues to get worse and fluctates throughout the week depending on her work scheduled. She has regressed in AROM and strength in the R knee compared to previous measures.  No progress has been made toward her goals. due to increased pain she plans to see her MD on Thursday for a follow-up. Peformed  IASTM which she reported some relief Attempted trial of iontophoresis to calm down pain and inflammation. Plan to continue with current POC to work toward remaining goals.     Pt will benefit from  skilled therapeutic intervention in  order to improve on the following deficits Hypomobility;Pain;Postural dysfunction;Improper body mechanics;Decreased endurance;Decreased range of motion;Decreased balance;Decreased activity tolerance;Decreased mobility;Increased muscle spasms;Increased edema   Rehab Potential Good   PT Frequency 2x / week   PT Duration 4 weeks   PT Treatment/Interventions ADLs/Self Care Home Management;Cryotherapy;Electrical Stimulation;Iontophoresis 4mg /ml Dexamethasone;Moist Heat;Ultrasound;Therapeutic activities;Therapeutic exercise;Manual techniques;Taping;Vasopneumatic Device;Passive range of motion;Patient/family education   PT Next Visit Plan assess response to iontophoresis, Nu-Step Vs bike, strengthening open and closed chain, cryotherapy for edema control/pain control PRN   PT Home Exercise Plan Reviewed HEP   Consulted and Agree with Plan of Care Patient        Problem List Patient Active Problem List   Diagnosis Date Noted  . S/P hysterectomy 12/12/2013  . Leiomyoma of body of uterus 04/18/2012   Erin Hall PT, DPT, LAT, ATC  10/27/2015  5:49 PM     [facility] [address] [place], [place], [address] Phone: [phone]   Fax:  [phone]  Name: Erin Hall MRN: [id number] Date of Birth: 12/16/1960

## 2015-10-29 ENCOUNTER — Ambulatory Visit: Payer: 59 | Admitting: Physical Therapy

## 2015-10-29 DIAGNOSIS — M24661 Ankylosis, right knee: Secondary | ICD-10-CM | POA: Diagnosis not present

## 2015-10-29 DIAGNOSIS — M24662 Ankylosis, left knee: Secondary | ICD-10-CM | POA: Diagnosis not present

## 2015-10-29 DIAGNOSIS — R262 Difficulty in walking, not elsewhere classified: Secondary | ICD-10-CM | POA: Diagnosis not present

## 2015-10-29 DIAGNOSIS — M25562 Pain in left knee: Secondary | ICD-10-CM | POA: Diagnosis not present

## 2015-10-29 DIAGNOSIS — R269 Unspecified abnormalities of gait and mobility: Secondary | ICD-10-CM | POA: Diagnosis not present

## 2015-10-29 DIAGNOSIS — M25461 Effusion, right knee: Secondary | ICD-10-CM | POA: Diagnosis not present

## 2015-10-29 DIAGNOSIS — M25561 Pain in right knee: Secondary | ICD-10-CM | POA: Diagnosis not present

## 2015-10-29 DIAGNOSIS — M6281 Muscle weakness (generalized): Secondary | ICD-10-CM | POA: Diagnosis not present

## 2015-10-29 NOTE — Therapy (Signed)
[place], [place], [address] Phone: [phone]   Fax:  [phone]  Physical Therapy Treatment  Patient Details  Name: Erin Hall MRN: [id number] Date of Birth: May 16, 1961 Referring Provider: Dr. Alvan Dame  Encounter Date: 10/29/2015      PT End of Session - 10/29/15 0850    Visit Number 11   Number of Visits 18   Date for PT Re-Evaluation 11/24/15   PT Start Time [id number]   PT Stop Time 0942   PT Time Calculation (min) 55 min      Past Medical History  Diagnosis Date  . Hypothyroidism   . Hyperlipidemia     hx of  . History of seasonal allergies   . History of cellulitis     DOG BITE RIGHT THIGH 2002  . Right knee meniscal tear   . History of uterine fibroid     Past Surgical History  Procedure Laterality Date  . Breast reduction surgery Bilateral 02-08-2007  . Knee surgery Left age 55    osteomalacia  . Uterine artery embolization  04/17/2012  . Robotic assisted total hysterectomy N/A 12/12/2013    Procedure: ROBOTIC ASSISTED TOTAL HYSTERECTOMY;  Surgeon: Marvene Staff, MD;  Location: [facility];  Service: Gynecology;  Laterality: N/A;  . Bilateral salpingectomy N/A 12/12/2013    Procedure: BILATERAL SALPINGECTOMY;  Surgeon: Marvene Staff, MD;  Location: [facility];  Service: Gynecology;  Laterality: N/A;  . Knee arthroscopy Left several -- last one 2008  . I & d right thigh abscess  12-04-2000  . Knee arthroscopy Right 08/20/2015    Procedure: ARTHROSCOPY RIGHT KNEE/ MEDIAL AND LATERAL PARTIAL MENISCECTOMY/ MEDIAL AND LATERAL PARTIAL CHONDROPLASTY/ BILATERAL [place] INJECTION;  Surgeon: Paralee Cancel, MD;  Location: [place] [facility];  Service: Orthopedics;  Laterality: Right;  AND BILATERAL HIPS    There were no vitals filed for this visit.  Visit Diagnosis:  Decreased range of knee movement, right  Muscle weakness of lower extremity  Knee swelling, right  Difficulty in  walking  Right knee pain  Decreased range of motion of knee, left  Abnormality of gait  Bilateral knee pain      Subjective Assessment - 10/29/15 0850    Subjective It's not any better. I am seeing my MD today.    Currently in Pain? Yes   Pain Score 5    Pain Location Knee   Pain Orientation Right;Anterior;Posterior   Pain Descriptors / Indicators Aching;Throbbing;[facility] Adult PT Treatment/Exercise - 10/29/15 0001    Knee/Hip Exercises: Stretches   Other Knee/Hip Stretches ITB 2 x 30 performed R side only   Knee/Hip Exercises: Aerobic   Nustep L6 x 5 min   Knee/Hip Exercises: Standing   Lateral Step Up 2 sets;10 reps;Step Height: 4";Hand Hold: 1   Forward Step Up 2 sets;10 reps;Hand Hold: 0;Step Height: 6"   Step Down 2 sets;10 reps;Step Height: 2";Hand Hold: 1   Step Down Limitations intermitent sharp pains-dc   Other Standing Knee Exercises with heels on 1/2 foam roller working eccentric quads, squat 10 x2   Knee/Hip Exercises: Seated   Sit to Sand 2 sets;10 reps  cues for good for form touching down on to table   Knee/Hip Exercises: Supine   Terminal Knee Extension 10 reps   Theraband Level (Terminal Knee Extension) Level 3 (  Green)   Terminal Knee Extension Limitations with heel prop   Bridges with Clamshell AROM;Strengthening;2 sets;10 reps  cues to keep hips up during abduction, green band   Straight Leg Raises 2 sets;10 reps   Straight Leg Raises Limitations with 2 #    Cryotherapy   Number Minutes Cryotherapy 10 Minutes   Cryotherapy Location Knee   Type of Cryotherapy Ice pack   Manual Therapy   Manual Therapy Taping   McConnell fat pad tape and patella tracking tape right knee                PT Education - 10/29/15 0930    Education provided Yes   Education Details Knee step up, lateral retro    Person(s) Educated Patient   Methods Explanation;Handout   Comprehension Verbalized understanding           PT Short Term Goals - 09/29/15 1208    PT SHORT TERM GOAL #1   Title pt will be I with inital HEP (09/29/15)   Time 4   Period Weeks   Status Achieved   PT SHORT TERM GOAL #2   Title pt will be able to verbalize and demonstrate techniques to reduce bil knee swelling and pain via RICE and HEP (09/29/15)   Time 4   Period Weeks   Status Achieved           PT Long Term Goals - 10/27/15 1742    PT LONG TERM GOAL #1   Title pt will be I with all HEP given throughout therapy (10/27/15)   Time 8   Period Weeks   Status On-going   PT LONG TERM GOAL #2   Title pt will increase right knee AROM to > 120 - 0 degrees to assist with an efficent gait pattern (10/27/15)   Time 8   Period Weeks   Status On-going   PT LONG TERM GOAL #3   Title pt will be able to stand/ walk > 30 minutes with < 3/10 pain  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   Time 8   Period Weeks   Status On-going   PT [place] #4   Title pt will be bale to navigate up/down > 12 steps with < 3/10 pain to assist with job requirments and community ambulation   Time 8   Period Weeks   Status On-going   PT LONG TERM GOAL #5   Title pt will improve her FOTO score to 41% limitation to demonstrate improved function with less pain at discharge    Time 8   Period Weeks   Status On-going   PT LONG TERM GOAL #6   Title Right hip and knee strength to at least 4+/5 needed for standing/walking for long periods of time for work   Time 8   Period Weeks   Status On-going               Plan - 10/29/15 [id number]    Clinical Impression Statement Pt reports continued pain that has not eased despite not working over the last few days. Pt reports McConnel's tape did not help before however she is willing to try again. Applied lateral tracking tape as well as fat pad release tape with pt reporting decreased pain with stair exercises. Worked on terminal knee exercises and eccentric  quads. She reports increased pain with retro step ups. Added step exercises to HEP and cautioned her to  not continue them if painful especially after tape is removed,    PT Next Visit Plan what did MD say? assess benefit of tape, check technique with step HEP, IFC if still in pain/increased edema?        Problem List Patient Active Problem List   Diagnosis Date Noted  . S/P hysterectomy 12/12/2013  . Leiomyoma of body of uterus 04/18/2012    Dorene Ar, PTA 10/29/2015, 9:45 AM  [place], [place], [address] Phone: [phone]   Fax:  [phone]  Name: Mirranda Ervin MRN: [id number] Date of Birth: Dec 25, 1960

## 2015-10-29 NOTE — Patient Instructions (Signed)
Step Down: Anterior   From 4-6" step, reach one leg forward as far as possible, still able to return easily. Touch toe and heel to floor. Return to single leg balance. Repeat with other leg. Repeat __10-15__ times. Do __2__ sessions per day. STOP IF PAINFUL Copyright  VHI. All rights reserved.    Step-Up: Lateral   Step up to side with right leg. Bring other foot up onto __4-6__ inch step. Return to floor position with left leg. Repeat ___10-15_ times per session. Do __2__ sessions per day. Repeat in dimly lit room. Repeat with eyes closed.  Copyright  VHI. All rights reserved.  Forward   Facing step, place one leg on step, flexed at hip. Step up slowly, bringing hips in line with knee and shoulder. Bring other foot onto step. Reverse process to step back down. Repeat with other leg. Do _10-15___ repetitions, ____ sessions per day  http://bt.exer.[email or web address]   Copyright  VHI. All rights reserved.

## 2015-11-03 ENCOUNTER — Encounter: Payer: 59 | Admitting: Physical Therapy

## 2015-11-03 DIAGNOSIS — H52203 Unspecified astigmatism, bilateral: Secondary | ICD-10-CM | POA: Diagnosis not present

## 2015-11-03 DIAGNOSIS — H524 Presbyopia: Secondary | ICD-10-CM | POA: Diagnosis not present

## 2015-11-03 MED FILL — MINIVELLE 0.1 MG PATCH: 0.1 | 28 days supply | Qty: 8 | Fill #5

## 2015-11-04 ENCOUNTER — Ambulatory Visit: Payer: 59

## 2015-11-04 DIAGNOSIS — M25561 Pain in right knee: Secondary | ICD-10-CM | POA: Diagnosis not present

## 2015-11-04 DIAGNOSIS — M25562 Pain in left knee: Secondary | ICD-10-CM | POA: Diagnosis not present

## 2015-11-04 DIAGNOSIS — M25461 Effusion, right knee: Secondary | ICD-10-CM | POA: Diagnosis not present

## 2015-11-04 DIAGNOSIS — M24662 Ankylosis, left knee: Secondary | ICD-10-CM | POA: Diagnosis not present

## 2015-11-04 DIAGNOSIS — M24661 Ankylosis, right knee: Secondary | ICD-10-CM | POA: Diagnosis not present

## 2015-11-04 DIAGNOSIS — R262 Difficulty in walking, not elsewhere classified: Secondary | ICD-10-CM | POA: Diagnosis not present

## 2015-11-04 DIAGNOSIS — R269 Unspecified abnormalities of gait and mobility: Secondary | ICD-10-CM | POA: Diagnosis not present

## 2015-11-04 DIAGNOSIS — M6281 Muscle weakness (generalized): Secondary | ICD-10-CM

## 2015-11-04 NOTE — Therapy (Signed)
[place], [place], [address] Phone: [phone]   Fax:  [phone]  Physical Therapy Treatment  Patient Details  Name: Erin Hall MRN: [id number] Date of Birth: Jan 19, 1961 Referring Provider: Dr. Alvan Dame  Encounter Date: 11/04/2015      PT End of Session - 11/04/15 0821    Visit Number 12   Number of Visits 18   Date for PT Re-Evaluation 11/24/15   Authorization Type [employer] [place]   PT Start Time 0805   PT Stop Time [id number]   PT Time Calculation (min) 52 min   Activity Tolerance Patient tolerated treatment well;Patient limited by pain   Behavior During Therapy [facility] for tasks assessed/performed      Past Medical History  Diagnosis Date  . Hypothyroidism   . Hyperlipidemia     hx of  . History of seasonal allergies   . History of cellulitis     DOG BITE RIGHT THIGH 2002  . Right knee meniscal tear   . History of uterine fibroid     Past Surgical History  Procedure Laterality Date  . Breast reduction surgery Bilateral 02-08-2007  . Knee surgery Left age 57    osteomalacia  . Uterine artery embolization  04/17/2012  . Robotic assisted total hysterectomy N/A 12/12/2013    Procedure: ROBOTIC ASSISTED TOTAL HYSTERECTOMY;  Surgeon: Marvene Staff, MD;  Location: [facility];  Service: Gynecology;  Laterality: N/A;  . Bilateral salpingectomy N/A 12/12/2013    Procedure: BILATERAL SALPINGECTOMY;  Surgeon: Marvene Staff, MD;  Location: [facility];  Service: Gynecology;  Laterality: N/A;  . Knee arthroscopy Left several -- last one 2008  . I & d right thigh abscess  12-04-2000  . Knee arthroscopy Right 08/20/2015    Procedure: ARTHROSCOPY RIGHT KNEE/ MEDIAL AND LATERAL PARTIAL MENISCECTOMY/ MEDIAL AND LATERAL PARTIAL CHONDROPLASTY/ BILATERAL [place] INJECTION;  Surgeon: Paralee Cancel, MD;  Location: [facility];  Service: Orthopedics;  Laterality: Right;  AND BILATERAL HIPS    There were  no vitals filed for this visit.  Visit Diagnosis:  Decreased range of knee movement, right  Muscle weakness of lower extremity  Right knee pain  Difficulty in walking  Knee swelling, right      Subjective Assessment - 11/04/15 0812    Subjective pt reports it feels better this week compared to last week. pt saw MD, Dr Alvan Dame, who asked if she wanted cortisone shot or to remove fluid .  Pt reports knee is still swelling and painful at night and wakes her from sleeping. Still having difficulty with stairs and limited bending. MD encouraged rest and suspected that she went back to work too soon.    Currently in Pain? Yes   Pain Score 5    Pain Location Knee   Pain Orientation Right;Anterior;Posterior                         [facility] Adult PT Treatment/Exercise - 11/04/15 0001    Knee/Hip Exercises: Stretches   Other Knee/Hip Stretches ITB 2 x 30 performed R side only  STM to ITB during stretch with 7# weight, in S/L   Knee/Hip Exercises: Aerobic   Recumbent Bike L1, 5 mins    Knee/Hip Exercises: Standing   Lateral Step Up 2 sets;10 reps;Hand Hold: 1;Step Height: 6"   Forward Step Up 2 sets;10 reps;Hand Hold: 0;Step Height: 6"   Other Standing Knee Exercises TKE with green theraband x  30 reps    Knee/Hip Exercises: Supine   Quad Sets AROM;2 sets;10 reps   Bridges with Diona Foley Squeeze 2 sets;20 reps   Straight Leg Raises AROM;10 reps   Knee/Hip Exercises: Sidelying   Clams 10 x 2    Cryotherapy   Number Minutes Cryotherapy 10 Minutes   Cryotherapy Location Knee   Type of Cryotherapy Ice pack                  PT Short Term Goals - 09/29/15 1208    PT SHORT TERM GOAL #1   Title pt will be I with inital HEP (09/29/15)   Time 4   Period Weeks   Status Achieved   PT SHORT TERM GOAL #2   Title pt will be able to verbalize and demonstrate techniques to reduce bil knee swelling and pain via RICE and HEP (09/29/15)   Time 4   Period Weeks   Status Achieved            PT Long Term Goals - 10/27/15 1742    PT LONG TERM GOAL #1   Title pt will be I with all HEP given throughout therapy (10/27/15)   Time 8   Period Weeks   Status On-going   PT LONG TERM GOAL #2   Title pt will increase right knee AROM to > 120 - 0 degrees to assist with an efficent gait pattern (10/27/15)   Time 8   Period Weeks   Status On-going   PT LONG TERM GOAL #3   Title pt will be able to stand/ walk > 30 minutes with < 3/10 pain  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   Time 8   Period Weeks   Status On-going   PT [place] #4   Title pt will be bale to navigate up/down > 12 steps with < 3/10 pain to assist with job requirments and community ambulation   Time 8   Period Weeks   Status On-going   PT LONG TERM GOAL #5   Title pt will improve her FOTO score to 41% limitation to demonstrate improved function with less pain at discharge    Time 8   Period Weeks   Status On-going   PT LONG TERM GOAL #6   Title Right hip and knee strength to at least 4+/5 needed for standing/walking for long periods of time for work   Time 8   Period Weeks   Status On-going               Plan - 11/04/15 0821    Clinical Impression Statement Extension lag noted with SLR, so 2# weight removed and still unable to perfrom with lag. Returned to QS with press into towel roll 10 x 2 sets, and following, pt was able to return to SLR without lag, although painful with the movement. pt able to tolerate 6" step  lateral step up without increased pain today.Bilat hip ABD strength limited as demo by clams.  Pain rated  pain 5/10 pre tx and 4/10 post tx.  Discussed the need to continue 2 times a week in order to return to PLOF.     PT Next Visit Plan  tape, if beneficial, check technique with step HEP, IFC if still in pain/increased edema? Quad strength. Add S/L hip ABD.     PT Home Exercise Plan Reviewed HEP   Consulted and Agree with Plan of  Care Patient        Problem List Patient Active Problem List   Diagnosis Date Noted  . S/P hysterectomy 12/12/2013  . Leiomyoma of body of uterus 04/18/2012    Dollene Cleveland, PT 11/04/2015, 8:55 AM  [place], [place], [address] Phone: [phone]   Fax:  [phone]  Name: Erin Hall MRN: [id number] Date of Birth: 18-Oct-1960

## 2015-11-10 ENCOUNTER — Encounter: Payer: 59 | Admitting: Physical Therapy

## 2015-11-12 ENCOUNTER — Ambulatory Visit: Payer: 59 | Admitting: Physical Therapy

## 2015-11-12 DIAGNOSIS — M24661 Ankylosis, right knee: Secondary | ICD-10-CM | POA: Diagnosis not present

## 2015-11-12 DIAGNOSIS — M25562 Pain in left knee: Secondary | ICD-10-CM | POA: Diagnosis not present

## 2015-11-12 DIAGNOSIS — M25461 Effusion, right knee: Secondary | ICD-10-CM | POA: Diagnosis not present

## 2015-11-12 DIAGNOSIS — M6281 Muscle weakness (generalized): Secondary | ICD-10-CM

## 2015-11-12 DIAGNOSIS — M25561 Pain in right knee: Secondary | ICD-10-CM | POA: Diagnosis not present

## 2015-11-12 DIAGNOSIS — R262 Difficulty in walking, not elsewhere classified: Secondary | ICD-10-CM | POA: Diagnosis not present

## 2015-11-12 DIAGNOSIS — R269 Unspecified abnormalities of gait and mobility: Secondary | ICD-10-CM | POA: Diagnosis not present

## 2015-11-12 DIAGNOSIS — N83202 Unspecified ovarian cyst, left side: Secondary | ICD-10-CM | POA: Diagnosis not present

## 2015-11-12 DIAGNOSIS — M24662 Ankylosis, left knee: Secondary | ICD-10-CM | POA: Diagnosis not present

## 2015-11-12 NOTE — Patient Instructions (Signed)
Straight leg raise with external rotation 2 x 10.

## 2015-11-12 NOTE — Therapy (Signed)
[place], [place], [address] Phone: [phone]   Fax:  [phone]  Physical Therapy Treatment  Patient Details  Name: Erin Hall MRN: [id number] Date of Birth: [birth year] Referring Provider: Dr. Alvan Dame  Encounter Date: 11/12/2015      PT End of Session - 11/12/15 0924    Visit Number 13   Number of Visits 18   Date for PT Re-Evaluation 11/24/15   PT Start Time 0804   PT Stop Time 0846   PT Time Calculation (min) 42 min   Activity Tolerance Patient tolerated treatment well   Behavior During Therapy [facility] for tasks assessed/performed      Past Medical History  Diagnosis Date  . Hypothyroidism   . Hyperlipidemia     hx of  . History of seasonal allergies   . History of cellulitis     DOG BITE RIGHT THIGH 2002  . Right knee meniscal tear   . History of uterine fibroid     Past Surgical History  Procedure Laterality Date  . Breast reduction surgery Bilateral 02-08-2007  . Knee surgery Left age 42    osteomalacia  . Uterine artery embolization  04/17/2012  . Robotic assisted total hysterectomy N/A 12/12/2013    Procedure: ROBOTIC ASSISTED TOTAL HYSTERECTOMY;  Surgeon: Marvene Staff, MD;  Location: [facility];  Service: Gynecology;  Laterality: N/A;  . Bilateral salpingectomy N/A 12/12/2013    Procedure: BILATERAL SALPINGECTOMY;  Surgeon: Marvene Staff, MD;  Location: [facility];  Service: Gynecology;  Laterality: N/A;  . Knee arthroscopy Left several -- last one 2008  . I & d right thigh abscess  12-04-2000  . Knee arthroscopy Right 08/20/2015    Procedure: ARTHROSCOPY RIGHT KNEE/ MEDIAL AND LATERAL PARTIAL MENISCECTOMY/ MEDIAL AND LATERAL PARTIAL CHONDROPLASTY/ BILATERAL [place] INJECTION;  Surgeon: Paralee Cancel, MD;  Location: [facility];  Service: Orthopedics;  Laterality: Right;  AND BILATERAL HIPS    There were no vitals filed for this visit.  Visit Diagnosis:   Decreased range of knee movement, right  Muscle weakness of lower extremity  Right knee pain  Difficulty in walking  Knee swelling, right      Subjective Assessment - 11/12/15 0806    Subjective "Im not swelling as much and the pain isn't as bad that it has been" the tape helped but she reports not being a fan of the tape.   Currently in Pain? Yes   Pain Score 5    Pain Location Knee   Pain Orientation Right   Pain Descriptors / Indicators Aching;Sore;Throbbing   Pain Type Chronic pain   Pain Onset More than a month ago   Pain Frequency Intermittent   Aggravating Factors  prolonged standing, stairs                         [facility] Adult PT Treatment/Exercise - 11/12/15 0813    Knee/Hip Exercises: Stretches   Other Knee/Hip Stretches ITB 2 x 30 performed R side only   Knee/Hip Exercises: Aerobic   Nustep L5 x 7 min   Knee/Hip Exercises: Seated   Long Arc Quad AROM;Strengthening;Right;2 sets;10 reps  squeezing ball between knees   Long Arc Quad Weight 4 lbs.   Knee/Hip Exercises: Supine   Straight Leg Raise with External Rotation AROM;Strengthening;Right;2 sets;10 reps   Manual Therapy   Myofascial Release manual trigger point release of bil hip glute medius x 3 each. R vastus lateralus  Manual trigger point release x 5  multiple palpable twitches at distal vastus lateralis                PT Education - 11/12/15 0935    Education provided Yes   Education Details updated HEP   Person(s) Educated Patient   Methods Explanation   Comprehension Verbalized understanding          PT Short Term Goals - 09/29/15 1208    PT SHORT TERM GOAL #1   Title pt will be I with inital HEP (09/29/15)   Time 4   Period Weeks   Status Achieved   PT SHORT TERM GOAL #2   Title pt will be able to verbalize and demonstrate techniques to reduce bil knee swelling and pain via RICE and HEP (09/29/15)   Time 4   Period Weeks   Status Achieved           PT Long  Term Goals - 10/27/15 1742    PT LONG TERM GOAL #1   Title pt will be I with all HEP given throughout therapy (10/27/15)   Time 8   Period Weeks   Status On-going   PT LONG TERM GOAL #2   Title pt will increase right knee AROM to > 120 - 0 degrees to assist with an efficent gait pattern (10/27/15)   Time 8   Period Weeks   Status On-going   PT LONG TERM GOAL #3   Title pt will be able to stand/ walk > 30 minutes with < 3/10 pain  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   Time 8   Period Weeks   Status On-going   PT [place] #4   Title pt will be bale to navigate up/down > 12 steps with < 3/10 pain to assist with job requirments and community ambulation   Time 8   Period Weeks   Status On-going   PT LONG TERM GOAL #5   Title pt will improve her FOTO score to 41% limitation to demonstrate improved function with less pain at discharge    Time 8   Period Weeks   Status On-going   PT LONG TERM GOAL #6   Title Right hip and knee strength to at least 4+/5 needed for standing/walking for long periods of time for work   Time 8   Period Weeks   Status On-going               Plan - 11/12/15 0933    Clinical Impression Statement Maddalynn reports she is doing better but is still having some pain inthe knee especially with ascending and descending steps. performed manaul trigger point release of bil hip glute med and R vastus lateralis and worked on activation of the quads/ VMO and reassessed stairs which she reported relief of pain going up but still had pain going down. Modifieid pt descending steps to step keeping her knee slightly bent to decrease preasure on the fat pad which she reported no pain with.    PT Next Visit Plan review  technique with stairs, IFC if still in pain/increased edema Quad strength. Add S/L hip ABD, manual for vastus lateralis/ bil glute med PRN   PT Home Exercise Plan SLR with ER   Consulted and Agree with Plan of  Care Patient        Problem List Patient Active Problem List   Diagnosis Date Noted  .  S/P hysterectomy 12/12/2013  . Leiomyoma of body of uterus 04/18/2012   [place] PT, DPT, LAT, ATC  11/12/2015  9:37 AM      [place], [place], [address] Phone: [phone]   Fax:  [phone]  Name: Erin Hall MRN: [id number] Date of Birth: [birth year]

## 2015-11-18 ENCOUNTER — Ambulatory Visit: Payer: 59

## 2015-11-18 DIAGNOSIS — M6281 Muscle weakness (generalized): Secondary | ICD-10-CM | POA: Diagnosis not present

## 2015-11-18 DIAGNOSIS — M25461 Effusion, right knee: Secondary | ICD-10-CM | POA: Diagnosis not present

## 2015-11-18 DIAGNOSIS — M24661 Ankylosis, right knee: Secondary | ICD-10-CM | POA: Diagnosis not present

## 2015-11-18 DIAGNOSIS — R262 Difficulty in walking, not elsewhere classified: Secondary | ICD-10-CM

## 2015-11-18 DIAGNOSIS — M25562 Pain in left knee: Secondary | ICD-10-CM | POA: Diagnosis not present

## 2015-11-18 DIAGNOSIS — M25561 Pain in right knee: Secondary | ICD-10-CM

## 2015-11-18 DIAGNOSIS — R269 Unspecified abnormalities of gait and mobility: Secondary | ICD-10-CM | POA: Diagnosis not present

## 2015-11-18 DIAGNOSIS — M24662 Ankylosis, left knee: Secondary | ICD-10-CM | POA: Diagnosis not present

## 2015-11-18 MED FILL — CELECOXIB 200 MG CAPSULE: 200 | 30 days supply | Qty: 60 | Fill #0

## 2015-11-18 NOTE — Therapy (Signed)
[place], [place], [address] Phone: [phone]   Fax:  [phone]  Physical Therapy Treatment  Patient Details  Name: Erin Hall MRN: [id number] Date of Birth: [birth year] Referring Provider: Dr. Alvan Dame  Encounter Date: 11/18/2015      PT End of Session - 11/18/15 1825    Visit Number 14   Number of Visits 18   Date for PT Re-Evaluation 11/24/15   Authorization Type [employer] [place]   PT Start Time [id number]   PT Stop Time 1635   PT Time Calculation (min) 50 min   Activity Tolerance Patient tolerated treatment well   Behavior During Therapy [facility] for tasks assessed/performed      Past Medical History  Diagnosis Date  . Hypothyroidism   . Hyperlipidemia     hx of  . History of seasonal allergies   . History of cellulitis     DOG BITE RIGHT THIGH 2002  . Right knee meniscal tear   . History of uterine fibroid     Past Surgical History  Procedure Laterality Date  . Breast reduction surgery Bilateral 02-08-2007  . Knee surgery Left age 69    osteomalacia  . Uterine artery embolization  04/17/2012  . Robotic assisted total hysterectomy N/A 12/12/2013    Procedure: ROBOTIC ASSISTED TOTAL HYSTERECTOMY;  Surgeon: Marvene Staff, MD;  Location: [facility];  Service: Gynecology;  Laterality: N/A;  . Bilateral salpingectomy N/A 12/12/2013    Procedure: BILATERAL SALPINGECTOMY;  Surgeon: Marvene Staff, MD;  Location: [facility];  Service: Gynecology;  Laterality: N/A;  . Knee arthroscopy Left several -- last one 2008  . I & d right thigh abscess  12-04-2000  . Knee arthroscopy Right 08/20/2015    Procedure: ARTHROSCOPY RIGHT KNEE/ MEDIAL AND LATERAL PARTIAL MENISCECTOMY/ MEDIAL AND LATERAL PARTIAL CHONDROPLASTY/ BILATERAL [place] INJECTION;  Surgeon: Paralee Cancel, MD;  Location: [facility];  Service: Orthopedics;  Laterality: Right;  AND BILATERAL HIPS    There were no vitals filed for  this visit.  Visit Diagnosis:  Decreased range of knee movement, right  Muscle weakness of lower extremity  Right knee pain  Difficulty in walking  Knee swelling, right      Subjective Assessment - 11/18/15 1820    Subjective Pain is really bad over past few days, 8/10. Not sure what is going on, it hurts now more than it did after sx.    Currently in Pain? Yes   Pain Score 8    Pain Location Knee   Pain Orientation Right   Pain Descriptors / Indicators Aching   Pain Type Acute pain;Chronic pain   Pain Onset More than a month ago                         [facility] Adult PT Treatment/Exercise - 11/18/15 0001    Modalities   Modalities Electrical Stimulation   Cryotherapy   Number Minutes Cryotherapy 15 Minutes   Cryotherapy Location Knee   Type of Cryotherapy Ice pack   Electrical Stimulation   Electrical Stimulation Location knee  R   Electrical Stimulation Action IFC   Electrical Stimulation Parameters 7   Electrical Stimulation Goals Pain   Manual Therapy   Myofascial Release STM to quad and HS. SCS to medial HS with mod release.   joint distraction with increased pain so discontinued.  PT Short Term Goals - 09/29/15 1208    PT SHORT TERM GOAL #1   Title pt will be I with inital HEP (09/29/15)   Time 4   Period Weeks   Status Achieved   PT SHORT TERM GOAL #2   Title pt will be able to verbalize and demonstrate techniques to reduce bil knee swelling and pain via RICE and HEP (09/29/15)   Time 4   Period Weeks   Status Achieved           PT Long Term Goals - 10/27/15 1742    PT LONG TERM GOAL #1   Title pt will be I with all HEP given throughout therapy (10/27/15)   Time 8   Period Weeks   Status On-going   PT LONG TERM GOAL #2   Title pt will increase right knee AROM to > 120 - 0 degrees to assist with an efficent gait pattern (10/27/15)   Time 8   Period Weeks   Status On-going   PT LONG TERM GOAL #3   Title pt  will be able to stand/ walk > 30 minutes with < 3/10 pain  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   Time 8   Period Weeks   Status On-going   PT [place] #4   Title pt will be bale to navigate up/down > 12 steps with < 3/10 pain to assist with job requirments and community ambulation   Time 8   Period Weeks   Status On-going   PT LONG TERM GOAL #5   Title pt will improve her FOTO score to 41% limitation to demonstrate improved function with less pain at discharge    Time 8   Period Weeks   Status On-going   PT LONG TERM GOAL #6   Title Right hip and knee strength to at least 4+/5 needed for standing/walking for long periods of time for work   Time 8   Period Weeks   Status On-going               Plan - 11/18/15 1826    Clinical Impression Statement Incresaed pain over last few days to 8/10. Pain with distraction, anterior drawer, and rotational forces at knee joint. Performed STM and SCS to help decreased symptoms followed by Ice and IFC, helped to decrease pain from 8/10 to 4/10 following tx, but PT recommended pt follow up with Dr Alvan Dame again, due to severe pain.    PT Next Visit Plan Follow up with MD? review  technique with stairs, IFC if still in pain/increased edema Quad strength. Add S/L hip ABD, manual for vastus lateralis/ bil glute med PRN   PT Home Exercise Plan SLR with ER   Consulted and Agree with Plan of Care Patient        Problem List Patient Active Problem List   Diagnosis Date Noted  . S/P hysterectomy 12/12/2013  . Leiomyoma of body of uterus 04/18/2012    Dollene Cleveland , PT  11/18/2015, 6:29 PM  [facility] [address] [place], [place], [address] Phone: [phone]   Fax:  [phone]  Name: Erin Hall MRN: [id number] Date of Birth: Jan 11, 1961

## 2015-11-20 ENCOUNTER — Ambulatory Visit: Payer: 59 | Admitting: Physical Therapy

## 2015-11-24 ENCOUNTER — Ambulatory Visit: Payer: 59 | Attending: Family Medicine | Admitting: Physical Therapy

## 2015-11-24 DIAGNOSIS — M25561 Pain in right knee: Secondary | ICD-10-CM | POA: Insufficient documentation

## 2015-11-24 DIAGNOSIS — M25661 Stiffness of right knee, not elsewhere classified: Secondary | ICD-10-CM | POA: Diagnosis not present

## 2015-11-24 DIAGNOSIS — R262 Difficulty in walking, not elsewhere classified: Secondary | ICD-10-CM | POA: Diagnosis not present

## 2015-11-24 DIAGNOSIS — M6281 Muscle weakness (generalized): Secondary | ICD-10-CM | POA: Diagnosis not present

## 2015-11-24 DIAGNOSIS — R600 Localized edema: Secondary | ICD-10-CM | POA: Insufficient documentation

## 2015-11-24 NOTE — Therapy (Signed)
[place], [place], [address] Phone: [phone]   Fax:  [phone]  Physical Therapy Treatment / Re-certification  Patient Details  Name: Erin Hall MRN: [id number] Date of Birth: [birth year] Referring Provider: Dr. Alvan Dame  Encounter Date: 11/24/2015      PT End of Session - 11/24/15 0837    Visit Number 15   Number of Visits 20   Date for PT Re-Evaluation 12/29/15   Authorization Type UMR [place]   PT Start Time 0808   Activity Tolerance Patient tolerated treatment well   Behavior During Therapy [facility] for tasks assessed/performed      Past Medical History  Diagnosis Date  . Hypothyroidism   . Hyperlipidemia     hx of  . History of seasonal allergies   . History of cellulitis     DOG BITE RIGHT THIGH 2002  . Right knee meniscal tear   . History of uterine fibroid     Past Surgical History  Procedure Laterality Date  . Breast reduction surgery Bilateral 02-08-2007  . Knee surgery Left age 55    osteomalacia  . Uterine artery embolization  04/17/2012  . Robotic assisted total hysterectomy N/A 12/12/2013    Procedure: ROBOTIC ASSISTED TOTAL HYSTERECTOMY;  Surgeon: Marvene Staff, MD;  Location: [facility];  Service: Gynecology;  Laterality: N/A;  . Bilateral salpingectomy N/A 12/12/2013    Procedure: BILATERAL SALPINGECTOMY;  Surgeon: Marvene Staff, MD;  Location: [facility];  Service: Gynecology;  Laterality: N/A;  . Knee arthroscopy Left several -- last one 2008  . I & d right thigh abscess  12-04-2000  . Knee arthroscopy Right 08/20/2015    Procedure: ARTHROSCOPY RIGHT KNEE/ MEDIAL AND LATERAL PARTIAL MENISCECTOMY/ MEDIAL AND LATERAL PARTIAL CHONDROPLASTY/ BILATERAL [place] INJECTION;  Surgeon: Paralee Cancel, MD;  Location: [facility];  Service: Orthopedics;  Laterality: Right;  AND BILATERAL HIPS    There were no vitals filed for this visit.  Visit Diagnosis:  Stiffness  of right knee, not elsewhere classified - Plan: PT plan of care cert/re-cert  Muscle weakness (generalized) - Plan: PT plan of care cert/re-cert  Difficulty in walking, not elsewhere classified - Plan: PT plan of care cert/re-cert  Pain in right knee - Plan: PT plan of care cert/re-cert  Localized edema - Plan: PT plan of care cert/re-cert      Subjective Assessment - 11/24/15 0810    Subjective "my pain is better today, at night I am still getting pain with referral down to the ankles" pt reports worked Saturday, Sunday and Monday with pain only 5/10, but did report feeling like her knee whould buckle descending stairs.   Currently in Pain? Yes   Pain Score 5             [facility] PT Assessment - 11/24/15 0825    Observation/Other Assessments   Focus on Therapeutic Outcomes (FOTO)  61% limited   AROM   Right Knee Extension 0   Right Knee Flexion 111   Strength   Right Knee Flexion 4/5  pain during testing   Right Knee Extension 4/5  pain with referral to the ankle during testing   Palpation   Palpation comment tendereness at the patellar tendon, tenderness at the semi-membranousus/ tendinousus, and biceps femoris, and tightness in th popliteal fossa with increased ankle tightness upon palpaiton in the popliteal fossa  [facility] Adult PT Treatment/Exercise - 11/24/15 0001    Knee/Hip Exercises: Stretches   Active Hamstring Stretch 2 reps;30 seconds   Other Knee/Hip Stretches ITB 2 x 30 performed R side only   Knee/Hip Exercises: Aerobic   Nustep L5 x 5 min  use LE only   Manual Therapy   Joint Mobilization grade 3 tibiofemoral distraction  pt reported immediate pain relief to 1/10   Myofascial Release STM to quad and HS.                PT Education - 11/24/15 [phone]    Education provided Yes   Education Details HEP review, and anatomy of the  posterior aspect of the knee   Person(s) Educated Patient   Methods Explanation    Comprehension Verbalized understanding          PT Short Term Goals - 09/29/15 1208    PT SHORT TERM GOAL #1   Title pt will be I with inital HEP (09/29/15)   Time 4   Period Weeks   Status Achieved   PT SHORT TERM GOAL #2   Title pt will be able to verbalize and demonstrate techniques to reduce bil knee swelling and pain via RICE and HEP (09/29/15)   Time 4   Period Weeks   Status Achieved           PT Long Term Goals - 11/24/15 3762    PT LONG TERM GOAL #1   Title pt will be I with all HEP given throughout therapy (10/27/15)   Time 8   Period Weeks   Status On-going   PT LONG TERM GOAL #2   Title pt will increase right knee AROM to > 120 - 0 degrees to assist with an efficent gait pattern (10/27/15)   Time 8   Period Weeks   Status On-going   PT LONG TERM GOAL #3   Title pt will be able to stand/ walk > 30 minutes with < 3/10 pain  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   Period Weeks   Status Partially Met   PT [place] #4   Title pt will be bale to navigate up/down > 12 steps with < 3/10 pain to assist with job requirments and community ambulation   Baseline stairs with 5/10 pain   Time 8   Period Weeks   Status Partially Met   PT LONG TERM GOAL #5   Title pt will improve her FOTO score to 41% limitation to demonstrate improved function with less pain at discharge    Baseline 61% limited   Time 8   Period Weeks   Status On-going   PT LONG TERM GOAL #6   Title Right hip and knee strength to at least 4+/5 needed for standing/walking for long periods of time for work   Time 8   Period Weeks   Status On-going               Plan - 11/24/15 0856    Clinical Impression Statement Swara contiues to report pain rated at 5/10 today in the R knee located in the posterior aspect as well as in the patellar tendon and medial joint line. she reported significant relief with  tibiofemoral distraction reported at 1/10 with some incrase in pain with tibiofemoral compression possibly indicating mensical invovement as well as pt reporting history of popping  and clicking and giving way of the knee, especially with descending stairs. palpation revealed tightness in the popliteal fossa with referral of pain to the ankle. She may benefit form further diagnostic assessment to rule out possible mensical inovlement. she partially met LTG #3, #4. plan to continue 1 x a week for the next 5 weeks to work toward remaining goals and if no progress is made to discharge back to her MD for re-assessment which pt understood and agreed.    Pt will benefit from skilled therapeutic intervention in order to improve on the following deficits Hypomobility;Pain;Postural dysfunction;Improper body mechanics;Decreased endurance;Decreased range of motion;Decreased balance;Decreased activity tolerance;Decreased mobility;Increased muscle spasms;Increased edema   Stiffness of right knee, not elsewhere classified,Muscle weakness (generalized),Difficulty in walking, not elsewhere classified,Pain in right knee,Localized edema   Rehab Potential Good   PT Frequency 1x / week   PT Duration --  5 weeks   PT Treatment/Interventions ADLs/Self Care Home Management;Cryotherapy;Electrical Stimulation;Iontophoresis 73m/ml Dexamethasone;Moist Heat;Ultrasound;Therapeutic activities;Therapeutic exercise;Manual techniques;Taping;Vasopneumatic Device;Passive range of motion;Patient/family education   PT Next Visit Plan Follow up with MD? review  technique with stairs, IFC if still in pain/increased edema Quad strength. Add S/L hip ABD, manual for vastus lateralis/ bil glute med PRN, tibiofemoral distraction   Consulted and Agree with Plan of Care Patient        Problem List Patient Active Problem List   Diagnosis Date Noted  . S/P hysterectomy 12/12/2013  . Leiomyoma of body of uterus 04/18/2012   KStarr Lake PT, DPT, LAT, ATC  11/24/2015  9:47 AM      [facility] [address]Phone: [phone]  Fax:  [phone] Name: TKerri-Anne HaeberleMRN: [id number] of Birth: 127-Sep-1962

## 2015-11-26 ENCOUNTER — Encounter: Payer: 59 | Admitting: Physical Therapy

## 2015-11-30 MED FILL — predniSONE 5 MG (48) TBPK: 5 | 12 days supply | Qty: 48 | Fill #0

## 2015-12-02 ENCOUNTER — Ambulatory Visit: Payer: 59 | Admitting: Physical Therapy

## 2015-12-03 ENCOUNTER — Encounter: Payer: 59 | Admitting: Physical Therapy

## 2015-12-07 ENCOUNTER — Ambulatory Visit (HOSPITAL_COMMUNITY): Admission: RE | Admit: 2015-12-07 | Payer: 59 | Source: Ambulatory Visit

## 2015-12-07 MED FILL — MINIVELLE 0.1 MG PATCH: 0.1 | 28 days supply | Qty: 8 | Fill #6

## 2015-12-08 ENCOUNTER — Ambulatory Visit: Payer: 59 | Admitting: Physical Therapy

## 2015-12-08 DIAGNOSIS — R262 Difficulty in walking, not elsewhere classified: Secondary | ICD-10-CM

## 2015-12-08 DIAGNOSIS — M25661 Stiffness of right knee, not elsewhere classified: Secondary | ICD-10-CM

## 2015-12-08 DIAGNOSIS — R600 Localized edema: Secondary | ICD-10-CM

## 2015-12-08 DIAGNOSIS — M25561 Pain in right knee: Secondary | ICD-10-CM

## 2015-12-08 DIAGNOSIS — M6281 Muscle weakness (generalized): Secondary | ICD-10-CM

## 2015-12-08 NOTE — Therapy (Addendum)
[place], [place], [address] Phone: [phone]   Fax:  [phone]  Physical Therapy Treatment / Discharge Note  Patient Details  Name: Erin Hall MRN: [id number] Date of Birth: Dec 25, 1960 Referring Provider: Dr. Alvan Dame  Encounter Date: 12/08/2015      PT End of Session - 12/08/15 0840    Visit Number 16   Number of Visits 20   Date for PT Re-Evaluation 12/29/15   Authorization Type UMR [place]   PT Start Time 0805   PT Stop Time 8502   PT Time Calculation (min) 49 min   Activity Tolerance Patient tolerated treatment well   Behavior During Therapy [facility] for tasks assessed/performed      Past Medical History  Diagnosis Date  . Hypothyroidism   . Hyperlipidemia     hx of  . History of seasonal allergies   . History of cellulitis     DOG BITE RIGHT THIGH 2002  . Right knee meniscal tear   . History of uterine fibroid     Past Surgical History  Procedure Laterality Date  . Breast reduction surgery Bilateral 02-08-2007  . Knee surgery Left age 55    osteomalacia  . Uterine artery embolization  04/17/2012  . Robotic assisted total hysterectomy N/A 12/12/2013    Procedure: ROBOTIC ASSISTED TOTAL HYSTERECTOMY;  Surgeon: Marvene Staff, MD;  Location: [facility];  Service: Gynecology;  Laterality: N/A;  . Bilateral salpingectomy N/A 12/12/2013    Procedure: BILATERAL SALPINGECTOMY;  Surgeon: Marvene Staff, MD;  Location: [facility];  Service: Gynecology;  Laterality: N/A;  . Knee arthroscopy Left several -- last one 2008  . I & d right thigh abscess  12-04-2000  . Knee arthroscopy Right 08/20/2015    Procedure: ARTHROSCOPY RIGHT KNEE/ MEDIAL AND LATERAL PARTIAL MENISCECTOMY/ MEDIAL AND LATERAL PARTIAL CHONDROPLASTY/ BILATERAL [place] INJECTION;  Surgeon: Paralee Cancel, MD;  Location: [facility];  Service: Orthopedics;  Laterality: Right;  AND BILATERAL HIPS    There were no  vitals filed for this visit.      Subjective Assessment - 12/08/15 0808    Subjective pt reports seeing the MD and reports her pain in the ankle could be nerve damage and is on a regiment of predisone"    Currently in Pain? Yes   Pain Score 4    Pain Location Knee   Pain Orientation Right   Pain Descriptors / Indicators Sore   Aggravating Factors  prolonged standing, stairs   Pain Relieving Factors unknown                         [facility] Adult PT Treatment/Exercise - 12/08/15 0812    Knee/Hip Exercises: Stretches   Active Hamstring Stretch 2 reps;30 seconds   Other Knee/Hip Stretches ITB 2 x 30 performed R side only   Knee/Hip Exercises: Aerobic   Nustep L5 x 7 min  LE only   Knee/Hip Exercises: Standing   Forward Lunges Both;2 sets;10 reps  touching down onto bosu for cues on right height   Other Standing Knee Exercises burpees 1 x 5  pt reported no pain   Knee/Hip Exercises: Prone   Hamstring Curl 1 set;10 reps  mountain climbers , cues to bend R knee   Cryotherapy   Number Minutes Cryotherapy 10 Minutes   Cryotherapy Location Knee   Type of Cryotherapy Ice pack   Manual Therapy   Manual Therapy Neural Stretch  Neural Stretch supine gentle intermittent sciatic nerve stretcing with ankle pumps during hamstring stretch, 3 x 10, seated sciatic nerve glides  glides given as HEP                PT Education - 12/08/15 0855    Education provided Yes   Education Details updated HEP   Person(s) Educated Patient   Methods Explanation   Comprehension Verbalized understanding          PT Short Term Goals - 09/29/15 1208    PT SHORT TERM GOAL #1   Title pt will be I with inital HEP (09/29/15)   Time 4   Period Weeks   Status Achieved   PT SHORT TERM GOAL #2   Title pt will be able to verbalize and demonstrate techniques to reduce bil knee swelling and pain via RICE and HEP (09/29/15)   Time 4   Period Weeks   Status Achieved           PT  Long Term Goals - 11/24/15 4403    PT LONG TERM GOAL #1   Title pt will be I with all HEP given throughout therapy (10/27/15)   Time 8   Period Weeks   Status On-going   PT LONG TERM GOAL #2   Title pt will increase right knee AROM to > 120 - 0 degrees to assist with an efficent gait pattern (10/27/15)   Time 8   Period Weeks   Status On-going   PT LONG TERM GOAL #3   Title pt will be able to stand/ walk > 30 minutes with < 3/10 pain  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   Period Weeks   Status Partially Met   PT [place] #4   Title pt will be bale to navigate up/down > 12 steps with < 3/10 pain to assist with job requirments and community ambulation   Baseline stairs with 5/10 pain   Time 8   Period Weeks   Status Partially Met   PT LONG TERM GOAL #5   Title pt will improve her FOTO score to 41% limitation to demonstrate improved function with less pain at discharge    Baseline 61% limited   Time 8   Period Weeks   Status On-going   PT LONG TERM GOAL #6   Title Right hip and knee strength to at least 4+/5 needed for standing/walking for long periods of time for work   Time 8   Period Weeks   Status On-going               Plan - 12/08/15 0855    Clinical Impression Statement Mrs. Pucillo continues to report tightness inthe knee with referred pain to the ankle. She saw her MD and reported it could be nerve damage and was put on a round of prednisone. Following nerve glides and stretching she reported relief of tingling in the ankle. She was able to perform standing exercises including burpees, and standing lunges touching bosu for cues to go to the same level with no report of increased pain. utilized ice pack post session to decrease pain/ soreness.    Rehab Potential Good   PT Frequency 1x / week   PT Duration --  5 weeks   PT Treatment/Interventions ADLs/Self Care Home  Management;Cryotherapy;Electrical Stimulation;Iontophoresis 7m/ml Dexamethasone;Moist Heat;Ultrasound;Therapeutic activities;Therapeutic exercise;Manual techniques;Taping;Vasopneumatic Device;Passive range of  motion;Patient/family education   PT Next Visit Plan review  technique with stairs, IFC if still in pain/increased edema Quad strength. Add S/L hip ABD, manual for vastus lateralis/ bil glute med PRN, tibiofemoral distraction   PT Home Exercise Plan sciatic nerve glide   Consulted and Agree with Plan of Care Patient      Patient will benefit from skilled therapeutic intervention in order to improve the following deficits and impairments:  Hypomobility, Pain, Postural dysfunction, Improper body mechanics, Decreased endurance, Decreased range of motion, Decreased balance, Decreased activity tolerance, Decreased mobility, Increased muscle spasms, Increased edema  Visit Diagnosis: Stiffness of right knee, not elsewhere classified - Plan: PT plan of care cert/re-cert  Muscle weakness (generalized) - Plan: PT plan of care cert/re-cert  Difficulty in walking, not elsewhere classified - Plan: PT plan of care cert/re-cert  Pain in right knee - Plan: PT plan of care cert/re-cert  Localized edema - Plan: PT plan of care cert/re-cert     Problem List Patient Active Problem List   Diagnosis Date Noted  . S/P hysterectomy 12/12/2013  . Leiomyoma of body of uterus 04/18/2012   Starr Lake PT, DPT, LAT, ATC  12/08/2015  9:26 AM      [facility] [address] [place], [place], [address] Phone: [phone]   Fax:  [phone]  Name: Jessicah Croll MRN: [id number] Date of Birth: May 17, 1961    PHYSICAL THERAPY DISCHARGE SUMMARY  Visits from Start of Care: 16  Current functional level related to goals / functional outcomes: See goals   Remaining deficits: Unknown due to pt not returning   Education / Equipment: HEP,  theraband,   Plan: Patient agrees to discharge.  Patient goals were not met. Patient is being discharged due to meeting the stated rehab goals.  ?????        Indya Oliveria PT, DPT, LAT, ATC  02/04/2016  2:38 PM

## 2015-12-09 ENCOUNTER — Ambulatory Visit (HOSPITAL_COMMUNITY)
Admission: RE | Admit: 2015-12-09 | Discharge: 2015-12-09 | Disposition: A | Discharge status: Home / Self Care | Payer: 59 | Source: Ambulatory Visit | Attending: Endocrinology | Admitting: Endocrinology

## 2015-12-09 DIAGNOSIS — E039 Hypothyroidism, unspecified: Secondary | ICD-10-CM | POA: Diagnosis not present

## 2015-12-09 DIAGNOSIS — E049 Nontoxic goiter, unspecified: Secondary | ICD-10-CM | POA: Diagnosis present

## 2015-12-09 DIAGNOSIS — E042 Nontoxic multinodular goiter: Secondary | ICD-10-CM | POA: Insufficient documentation

## 2015-12-10 ENCOUNTER — Encounter: Payer: 59 | Admitting: Physical Therapy

## 2015-12-10 DIAGNOSIS — E049 Nontoxic goiter, unspecified: Secondary | ICD-10-CM | POA: Diagnosis not present

## 2015-12-10 DIAGNOSIS — E039 Hypothyroidism, unspecified: Secondary | ICD-10-CM | POA: Diagnosis not present

## 2015-12-10 DIAGNOSIS — E7800 Pure hypercholesterolemia, unspecified: Secondary | ICD-10-CM | POA: Diagnosis not present

## 2015-12-10 DIAGNOSIS — E663 Overweight: Secondary | ICD-10-CM | POA: Diagnosis not present

## 2015-12-18 DIAGNOSIS — M1711 Unilateral primary osteoarthritis, right knee: Secondary | ICD-10-CM | POA: Diagnosis not present

## 2015-12-18 DIAGNOSIS — Z4789 Encounter for other orthopedic aftercare: Secondary | ICD-10-CM | POA: Diagnosis not present

## 2015-12-18 MED FILL — predniSONE 5 MG TABS: 5 | 30 days supply | Qty: 100 | Fill #0

## 2015-12-24 ENCOUNTER — Ambulatory Visit: Payer: 59 | Attending: Family Medicine | Admitting: Physical Therapy

## 2015-12-25 MED FILL — CELECOXIB 200 MG CAPSULE: 200 | 30 days supply | Qty: 60 | Fill #1

## 2015-12-25 MED FILL — CLINDAMYCIN HCL 150 MG CAP: 150 | 7 days supply | Qty: 28 | Fill #0

## 2015-12-29 MED FILL — MINIVELLE 0.1 MG PATCH: 0.1 | 28 days supply | Qty: 8 | Fill #0

## 2015-12-31 ENCOUNTER — Ambulatory Visit: Payer: 59 | Admitting: Physical Therapy

## 2016-01-06 DIAGNOSIS — Z1231 Encounter for screening mammogram for malignant neoplasm of breast: Secondary | ICD-10-CM | POA: Diagnosis not present

## 2016-01-06 DIAGNOSIS — Z803 Family history of malignant neoplasm of breast: Secondary | ICD-10-CM | POA: Diagnosis not present

## 2016-01-19 DIAGNOSIS — Z01419 Encounter for gynecological examination (general) (routine) without abnormal findings: Secondary | ICD-10-CM | POA: Diagnosis not present

## 2016-01-19 DIAGNOSIS — Z6827 Body mass index (BMI) 27.0-27.9, adult: Secondary | ICD-10-CM | POA: Diagnosis not present

## 2016-01-20 DIAGNOSIS — Z130 Encounter for screening for diseases of the blood and blood-forming organs and certain disorders involving the immune mechanism: Secondary | ICD-10-CM | POA: Diagnosis not present

## 2016-01-20 DIAGNOSIS — Z131 Encounter for screening for diabetes mellitus: Secondary | ICD-10-CM | POA: Diagnosis not present

## 2016-01-20 DIAGNOSIS — Z1329 Encounter for screening for other suspected endocrine disorder: Secondary | ICD-10-CM | POA: Diagnosis not present

## 2016-01-20 DIAGNOSIS — Z13220 Encounter for screening for lipoid disorders: Secondary | ICD-10-CM | POA: Diagnosis not present

## 2016-01-20 DIAGNOSIS — Z0000 Encounter for general adult medical examination without abnormal findings: Secondary | ICD-10-CM | POA: Diagnosis not present

## 2016-02-17 MED FILL — MINIVELLE 0.1 MG PATCH: 0.1 | 28 days supply | Qty: 8 | Fill #0

## 2016-02-17 MED FILL — CELECOXIB 200 MG CAPSULE: 200 | 30 days supply | Qty: 60 | Fill #2

## 2016-03-01 MED FILL — LEVOTHYROXINE 50 MCG TABLET: 50 | 30 days supply | Qty: 30 | Fill #0

## 2016-03-10 DIAGNOSIS — E7800 Pure hypercholesterolemia, unspecified: Secondary | ICD-10-CM | POA: Diagnosis not present

## 2016-03-10 DIAGNOSIS — E049 Nontoxic goiter, unspecified: Secondary | ICD-10-CM | POA: Diagnosis not present

## 2016-03-10 DIAGNOSIS — R7301 Impaired fasting glucose: Secondary | ICD-10-CM | POA: Diagnosis not present

## 2016-03-10 DIAGNOSIS — E039 Hypothyroidism, unspecified: Secondary | ICD-10-CM | POA: Diagnosis not present

## 2016-04-05 MED FILL — MINIVELLE 0.1 MG PATCH: 0.1 | 28 days supply | Qty: 8 | Fill #1

## 2016-04-05 MED FILL — CELECOXIB 200 MG CAPSULE: 200 | 30 days supply | Qty: 60 | Fill #0

## 2016-04-05 MED FILL — LEVOTHYROXINE 50 MCG TABLET: 50 | 30 days supply | Qty: 30 | Fill #1

## 2016-04-15 MED FILL — AMOXICILLIN 500 MG CAPSULE: 500 | 9 days supply | Qty: 36 | Fill #0

## 2016-05-11 DIAGNOSIS — E7800 Pure hypercholesterolemia, unspecified: Secondary | ICD-10-CM | POA: Diagnosis not present

## 2016-05-11 DIAGNOSIS — R7309 Other abnormal glucose: Secondary | ICD-10-CM | POA: Diagnosis not present

## 2016-05-11 DIAGNOSIS — E039 Hypothyroidism, unspecified: Secondary | ICD-10-CM | POA: Diagnosis not present

## 2016-05-13 MED FILL — LEVOTHYROXINE 50 MCG TABLET: 50 | 30 days supply | Qty: 30 | Fill #2

## 2016-05-23 MED FILL — FLUCONAZOLE 150 MG TABLET: 150 | 1 days supply | Qty: 1 | Fill #0

## 2016-05-31 MED FILL — MINIVELLE 0.1 MG PATCH: 0.1 | 28 days supply | Qty: 8 | Fill #2

## 2016-06-16 DIAGNOSIS — Z1159 Encounter for screening for other viral diseases: Secondary | ICD-10-CM | POA: Diagnosis not present

## 2016-06-16 DIAGNOSIS — E042 Nontoxic multinodular goiter: Secondary | ICD-10-CM | POA: Diagnosis not present

## 2016-06-16 DIAGNOSIS — G4700 Insomnia, unspecified: Secondary | ICD-10-CM | POA: Diagnosis not present

## 2016-06-17 MED FILL — CELECOXIB 200 MG CAPSULE: 200 | 30 days supply | Qty: 60 | Fill #1

## 2016-06-17 MED FILL — ALPRAZolam 0.5 MG TABS: 0.5 | 90 days supply | Qty: 15 | Fill #0

## 2016-06-17 MED FILL — LEVOTHYROXINE 50 MCG TABLET: 50 | 90 days supply | Qty: 90 | Fill #3

## 2016-06-20 MED FILL — MINIVELLE 0.1 MG PATCH: 0.1 | 28 days supply | Qty: 8 | Fill #3

## 2016-06-21 MED FILL — CHLORHEXIDINE 0.12% RINSE: 0.12 | 16 days supply | Qty: 473 | Fill #0

## 2016-06-21 MED FILL — HYDROCODON-APAP 5-325: 5-325 | 4 days supply | Qty: 15 | Fill #0

## 2016-06-21 MED FILL — AMOXICILLIN 500 MG CAPSULE: 500 | 7 days supply | Qty: 21 | Fill #0

## 2016-06-21 MED FILL — IBUPROFEN 400 MG TABLET: 400 | 5 days supply | Qty: 30 | Fill #0

## 2016-08-03 DIAGNOSIS — M79605 Pain in left leg: Secondary | ICD-10-CM | POA: Diagnosis not present

## 2016-08-03 DIAGNOSIS — M5416 Radiculopathy, lumbar region: Secondary | ICD-10-CM | POA: Diagnosis not present

## 2016-08-03 DIAGNOSIS — M79604 Pain in right leg: Secondary | ICD-10-CM | POA: Diagnosis not present

## 2016-08-03 DIAGNOSIS — M25561 Pain in right knee: Secondary | ICD-10-CM | POA: Diagnosis not present

## 2016-08-03 DIAGNOSIS — M170 Bilateral primary osteoarthritis of knee: Secondary | ICD-10-CM | POA: Diagnosis not present

## 2016-08-12 MED FILL — FLUCONAZOLE 150 MG TABLET: 150 | 1 days supply | Qty: 1 | Fill #0

## 2016-08-17 MED FILL — MINIVELLE 0.1 MG PATCH: 0.1 | 28 days supply | Qty: 8 | Fill #4

## 2016-08-17 MED FILL — CELECOXIB 200 MG CAPSULE: 200 | 30 days supply | Qty: 60 | Fill #2

## 2016-09-29 MED FILL — LEVOTHYROXINE 50 MCG TABLET: 50 | 30 days supply | Qty: 30 | Fill #4

## 2016-09-29 MED FILL — MINIVELLE 0.1 MG PATCH: 0.1 | 28 days supply | Qty: 8 | Fill #5

## 2016-10-07 MED FILL — AMOXICILLIN 500 MG CAPSULE: 500 | 7 days supply | Qty: 21 | Fill #0

## 2016-10-28 MED FILL — IBUPROFEN 400 MG TABLET: 400 | 7 days supply | Qty: 30 | Fill #0

## 2016-10-28 MED FILL — MINIVELLE 0.1 MG PATCH: 0.1 | 28 days supply | Qty: 8 | Fill #6

## 2016-10-28 MED FILL — CELECOXIB 200 MG CAPSULE: 200 | 30 days supply | Qty: 60 | Fill #0

## 2016-10-31 MED FILL — LEVOTHYROXINE 50 MCG TABLET: 50 | 30 days supply | Qty: 30 | Fill #0

## 2016-11-11 MED FILL — ALPRAZolam 0.5 MG TABS: 0.5 | 90 days supply | Qty: 15 | Fill #0

## 2016-12-06 MED FILL — LEVOTHYROXINE 50 MCG TABLET: 50 | 30 days supply | Qty: 30 | Fill #1

## 2016-12-15 MED FILL — MINIVELLE 0.1 MG PATCH: 0.1 | 28 days supply | Qty: 8 | Fill #7

## 2016-12-20 DIAGNOSIS — E7800 Pure hypercholesterolemia, unspecified: Secondary | ICD-10-CM | POA: Diagnosis not present

## 2016-12-20 DIAGNOSIS — R7301 Impaired fasting glucose: Secondary | ICD-10-CM | POA: Diagnosis not present

## 2016-12-20 DIAGNOSIS — E039 Hypothyroidism, unspecified: Secondary | ICD-10-CM | POA: Diagnosis not present

## 2016-12-23 MED FILL — CELECOXIB 200 MG CAP: 200 | 30 days supply | Qty: 60 | Fill #1

## 2017-01-04 DIAGNOSIS — E039 Hypothyroidism, unspecified: Secondary | ICD-10-CM | POA: Diagnosis not present

## 2017-01-04 DIAGNOSIS — R7301 Impaired fasting glucose: Secondary | ICD-10-CM | POA: Diagnosis not present

## 2017-01-04 DIAGNOSIS — E049 Nontoxic goiter, unspecified: Secondary | ICD-10-CM | POA: Diagnosis not present

## 2017-01-04 DIAGNOSIS — E7800 Pure hypercholesterolemia, unspecified: Secondary | ICD-10-CM | POA: Diagnosis not present

## 2017-01-06 MED FILL — LEVOTHYROXINE 50 MCG TABLET: 50 | 90 days supply | Qty: 90 | Fill #2

## 2017-01-18 DIAGNOSIS — Z1231 Encounter for screening mammogram for malignant neoplasm of breast: Secondary | ICD-10-CM | POA: Diagnosis not present

## 2017-01-18 DIAGNOSIS — Z803 Family history of malignant neoplasm of breast: Secondary | ICD-10-CM | POA: Diagnosis not present

## 2017-01-20 DIAGNOSIS — Z6827 Body mass index (BMI) 27.0-27.9, adult: Secondary | ICD-10-CM | POA: Diagnosis not present

## 2017-01-20 DIAGNOSIS — M170 Bilateral primary osteoarthritis of knee: Secondary | ICD-10-CM | POA: Diagnosis not present

## 2017-01-20 DIAGNOSIS — M25562 Pain in left knee: Secondary | ICD-10-CM | POA: Diagnosis not present

## 2017-01-20 DIAGNOSIS — Z01419 Encounter for gynecological examination (general) (routine) without abnormal findings: Secondary | ICD-10-CM | POA: Diagnosis not present

## 2017-01-20 DIAGNOSIS — M25561 Pain in right knee: Secondary | ICD-10-CM | POA: Diagnosis not present

## 2017-01-27 MED FILL — MINIVELLE 0.1 MG PATCH: 0.1 | 28 days supply | Qty: 8 | Fill #0

## 2017-02-09 MED FILL — ALPRAZolam 0.5 MG TABS: 0.5 | 90 days supply | Qty: 15 | Fill #0

## 2017-02-10 MED FILL — CELECOXIB 200 MG CAP: 200 | 30 days supply | Qty: 60 | Fill #2

## 2017-02-14 MED FILL — AMOXICILLIN 500 MG CAPSULE: 500 | 7 days supply | Qty: 21 | Fill #0

## 2017-03-02 DIAGNOSIS — M25561 Pain in right knee: Secondary | ICD-10-CM | POA: Diagnosis not present

## 2017-03-02 DIAGNOSIS — M25562 Pain in left knee: Secondary | ICD-10-CM | POA: Diagnosis not present

## 2017-03-02 DIAGNOSIS — G8929 Other chronic pain: Secondary | ICD-10-CM | POA: Diagnosis not present

## 2017-03-08 DIAGNOSIS — M170 Bilateral primary osteoarthritis of knee: Secondary | ICD-10-CM | POA: Diagnosis not present

## 2017-03-08 DIAGNOSIS — M25561 Pain in right knee: Secondary | ICD-10-CM | POA: Diagnosis not present

## 2017-03-08 DIAGNOSIS — G8929 Other chronic pain: Secondary | ICD-10-CM | POA: Diagnosis not present

## 2017-03-08 DIAGNOSIS — M25562 Pain in left knee: Secondary | ICD-10-CM | POA: Diagnosis not present

## 2017-03-10 MED FILL — MINIVELLE 0.1 MG PATCH: 0.1 | 28 days supply | Qty: 8 | Fill #1

## 2017-03-31 MED FILL — LEVOTHYROXINE 50 MCG TABLET: 50 | 60 days supply | Qty: 60 | Fill #3

## 2017-03-31 MED FILL — CELECOXIB 200 MG CAP: 200 | 30 days supply | Qty: 30 | Fill #0

## 2017-04-26 MED FILL — MINIVELLE 0.1 MG PATCH: 0.1 | 28 days supply | Qty: 8 | Fill #2

## 2017-04-26 MED FILL — CELECOXIB 200 MG CAPSULE: 200 | 30 days supply | Qty: 30 | Fill #1

## 2017-06-05 MED FILL — CELECOXIB 200 MG CAPS: 200 | 30 days supply | Qty: 30 | Fill #2

## 2017-06-07 MED FILL — MINIVELLE 0.1 MG PATCH: 0.1 | 28 days supply | Qty: 8 | Fill #3

## 2017-06-16 DIAGNOSIS — Z79899 Other long term (current) drug therapy: Secondary | ICD-10-CM | POA: Diagnosis not present

## 2017-06-16 DIAGNOSIS — Z800 Family history of malignant neoplasm of digestive organs: Secondary | ICD-10-CM | POA: Diagnosis not present

## 2017-06-16 DIAGNOSIS — E042 Nontoxic multinodular goiter: Secondary | ICD-10-CM | POA: Diagnosis not present

## 2017-06-16 DIAGNOSIS — Z23 Encounter for immunization: Secondary | ICD-10-CM | POA: Diagnosis not present

## 2017-06-16 DIAGNOSIS — G4700 Insomnia, unspecified: Secondary | ICD-10-CM | POA: Diagnosis not present

## 2017-06-16 DIAGNOSIS — E7800 Pure hypercholesterolemia, unspecified: Secondary | ICD-10-CM | POA: Diagnosis not present

## 2017-06-16 MED FILL — ALPRAZolam 0.5 MG TABS: 0.5 | 30 days supply | Qty: 30 | Fill #0

## 2017-06-28 MED FILL — LEVOTHYROXINE 50 MCG TABLET: 50 | 30 days supply | Qty: 30 | Fill #0

## 2017-07-07 DIAGNOSIS — E7800 Pure hypercholesterolemia, unspecified: Secondary | ICD-10-CM | POA: Diagnosis not present

## 2017-07-07 DIAGNOSIS — E039 Hypothyroidism, unspecified: Secondary | ICD-10-CM | POA: Diagnosis not present

## 2017-07-07 DIAGNOSIS — G4700 Insomnia, unspecified: Secondary | ICD-10-CM | POA: Diagnosis not present

## 2017-07-07 DIAGNOSIS — Z7189 Other specified counseling: Secondary | ICD-10-CM | POA: Diagnosis not present

## 2017-07-07 DIAGNOSIS — E042 Nontoxic multinodular goiter: Secondary | ICD-10-CM | POA: Diagnosis not present

## 2017-07-07 DIAGNOSIS — R7301 Impaired fasting glucose: Secondary | ICD-10-CM | POA: Diagnosis not present

## 2017-07-07 DIAGNOSIS — Z800 Family history of malignant neoplasm of digestive organs: Secondary | ICD-10-CM | POA: Diagnosis not present

## 2017-07-07 DIAGNOSIS — Z23 Encounter for immunization: Secondary | ICD-10-CM | POA: Diagnosis not present

## 2017-07-07 DIAGNOSIS — Z79899 Other long term (current) drug therapy: Secondary | ICD-10-CM | POA: Diagnosis not present

## 2017-07-07 MED FILL — VIVOTIF EC CAPSULE: 8 days supply | Qty: 4 | Fill #0

## 2017-07-07 MED FILL — CELECOXIB 200 MG CAPS: 200 | 30 days supply | Qty: 60 | Fill #0

## 2017-07-12 DIAGNOSIS — E049 Nontoxic goiter, unspecified: Secondary | ICD-10-CM | POA: Diagnosis not present

## 2017-07-12 DIAGNOSIS — E039 Hypothyroidism, unspecified: Secondary | ICD-10-CM | POA: Diagnosis not present

## 2017-07-12 DIAGNOSIS — E7800 Pure hypercholesterolemia, unspecified: Secondary | ICD-10-CM | POA: Diagnosis not present

## 2017-07-12 DIAGNOSIS — R7301 Impaired fasting glucose: Secondary | ICD-10-CM | POA: Diagnosis not present

## 2017-07-12 DIAGNOSIS — E663 Overweight: Secondary | ICD-10-CM | POA: Diagnosis not present

## 2017-08-03 MED FILL — ALPRAZolam 0.5 MG TABS: 0.5 | 30 days supply | Qty: 30 | Fill #0

## 2017-08-03 MED FILL — ESTRADIOL 0.1 MG/24HR PTTW: 0.1 | 28 days supply | Qty: 8 | Fill #4

## 2017-08-03 MED FILL — LEVOTHYROXINE 50 MCG TABLET: 50 | 90 days supply | Qty: 90 | Fill #0

## 2017-08-03 MED FILL — CELECOXIB 200 MG CAP: 200 | 30 days supply | Qty: 60 | Fill #1

## 2017-08-16 DIAGNOSIS — H52203 Unspecified astigmatism, bilateral: Secondary | ICD-10-CM | POA: Diagnosis not present

## 2017-08-21 MED FILL — AMOXICILLIN 500 MG CAPSULE: 500 | 7 days supply | Qty: 21 | Fill #0

## 2017-08-21 MED FILL — CHLORHEXIDINE 0.12% RINSE: 0.12 | 16 days supply | Qty: 473 | Fill #0

## 2017-08-21 MED FILL — IBUPROFEN 400 MG TABS: 400 | 8 days supply | Qty: 30 | Fill #0

## 2017-08-21 MED FILL — HYDROCODON-APAP 5-325: 5-325 | 2 days supply | Qty: 7 | Fill #0

## 2017-08-29 MED FILL — HYDROCODON-APAP 5-325: 5-325 | 4 days supply | Qty: 15 | Fill #0

## 2017-08-29 MED FILL — AMOX-CLAV 875-125 MG TABLET: 875-125 | 10 days supply | Qty: 20 | Fill #0

## 2017-09-08 MED FILL — ALPRAZolam 0.5 MG TABS: 0.5 | 30 days supply | Qty: 30 | Fill #0

## 2017-09-12 MED FILL — ESTRADIOL 0.1 MG/24HR PTTW: 0.1 | 84 days supply | Qty: 24 | Fill #5

## 2017-09-28 MED FILL — CELECOXIB 200 MG CAP: 200 | 30 days supply | Qty: 60 | Fill #2

## 2017-10-13 MED FILL — ALPRAZolam 0.5 MG TABS: 0.5 | 30 days supply | Qty: 30 | Fill #1

## 2017-10-24 DIAGNOSIS — M25511 Pain in right shoulder: Secondary | ICD-10-CM | POA: Diagnosis not present

## 2017-10-24 DIAGNOSIS — M7541 Impingement syndrome of right shoulder: Secondary | ICD-10-CM | POA: Diagnosis not present

## 2017-10-26 MED FILL — SHINGRIX 50 MCG SUS: 50 | 1 days supply | Qty: 1 | Fill #0

## 2017-11-10 MED FILL — CELECOXIB 200 MG CAP: 200 | 30 days supply | Qty: 30 | Fill #0

## 2017-11-10 MED FILL — LEVOTHYROXINE 50 MCG TABLET: 50 | 90 days supply | Qty: 90 | Fill #1

## 2017-11-10 MED FILL — ALPRAZolam 0.5 MG TABS: 0.5 | 30 days supply | Qty: 30 | Fill #2

## 2017-11-22 DIAGNOSIS — M7541 Impingement syndrome of right shoulder: Secondary | ICD-10-CM | POA: Diagnosis not present

## 2017-11-22 DIAGNOSIS — M25511 Pain in right shoulder: Secondary | ICD-10-CM | POA: Diagnosis not present

## 2017-12-14 MED FILL — CELECOXIB 200 MG CAP: 200 | 30 days supply | Qty: 30 | Fill #1

## 2017-12-15 MED FILL — ALPRAZolam 0.5 MG TABS: 0.5 | 30 days supply | Qty: 30 | Fill #0

## 2017-12-20 ENCOUNTER — Other Ambulatory Visit (HOSPITAL_COMMUNITY): Payer: Self-pay | Admitting: Orthopedic Surgery

## 2017-12-20 DIAGNOSIS — M7541 Impingement syndrome of right shoulder: Secondary | ICD-10-CM

## 2017-12-25 ENCOUNTER — Ambulatory Visit (HOSPITAL_COMMUNITY): Payer: 59

## 2017-12-25 ENCOUNTER — Ambulatory Visit (HOSPITAL_COMMUNITY)
Admission: RE | Admit: 2017-12-25 | Discharge: 2017-12-25 | Disposition: A | Discharge status: Home / Self Care | Payer: 59 | Source: Ambulatory Visit | Attending: Orthopedic Surgery | Admitting: Orthopedic Surgery

## 2017-12-25 DIAGNOSIS — S46811A Strain of other muscles, fascia and tendons at shoulder and upper arm level, right arm, initial encounter: Secondary | ICD-10-CM | POA: Diagnosis not present

## 2017-12-25 DIAGNOSIS — X58XXXA Exposure to other specified factors, initial encounter: Secondary | ICD-10-CM | POA: Diagnosis not present

## 2017-12-25 DIAGNOSIS — M75111 Incomplete rotator cuff tear or rupture of right shoulder, not specified as traumatic: Secondary | ICD-10-CM | POA: Insufficient documentation

## 2017-12-25 DIAGNOSIS — M7541 Impingement syndrome of right shoulder: Secondary | ICD-10-CM | POA: Diagnosis not present

## 2017-12-25 DIAGNOSIS — M25511 Pain in right shoulder: Secondary | ICD-10-CM | POA: Diagnosis not present

## 2017-12-26 MED FILL — SHINGRIX 50 MCG SUS: 50 | 1 days supply | Qty: 1 | Fill #1

## 2017-12-27 ENCOUNTER — Ambulatory Visit (HOSPITAL_COMMUNITY): Payer: 59

## 2017-12-29 DIAGNOSIS — M75121 Complete rotator cuff tear or rupture of right shoulder, not specified as traumatic: Secondary | ICD-10-CM | POA: Diagnosis not present

## 2017-12-29 DIAGNOSIS — M7541 Impingement syndrome of right shoulder: Secondary | ICD-10-CM | POA: Diagnosis not present

## 2017-12-29 DIAGNOSIS — M25511 Pain in right shoulder: Secondary | ICD-10-CM | POA: Diagnosis not present

## 2018-01-16 ENCOUNTER — Encounter (HOSPITAL_COMMUNITY): Payer: Self-pay

## 2018-01-16 NOTE — Pre-Procedure Instructions (Addendum)
Najai Waszak  01/16/2018      [place], [place] - [address] [facility]. [address] [place] [address] Phone: [phone] Fax: [phone]    Your procedure is scheduled on 01/25/2018.  Report to [facility] Admitting at [place].M.  Call this number if you have problems the morning of surgery:  [phone]   Remember:  Nothing to eat or drink after midnight the night before your surgery    Take these medicines the morning of surgery with A SIP OF WATER: Hydrocodone-acetaminophen (Norco) - if needed Levothyroxine (Synthroid)  7 days prior to surgery STOP taking any Celebrex, aspirin (unless otherwise instructed by your surgeon), Aleve, Naproxen, Ibuprofen, Motrin, Advil, Goody's, BC's, all herbal medications, fish oil, and all vitamins  Follow your doctors instructions regarding your Aspirin.  If no instructions were given by your doctor, then you will need to call the prescribing office office to get instructions.       Do not wear jewelry, make-up or nail polish.  Do not wear lotions, powders, or perfumes, or deodorant.  Do not shave 48 hours prior to surgery.    Do not bring valuables to the hospital.  [facility] is not responsible for any belongings or valuables.  Hearing aids, eyeglasses, contacts, dentures or bridgework may not be worn into surgery.  Leave your suitcase in the car.  After surgery it may be brought to your room.  For patients admitted to the hospital, discharge time will be determined by your treatment team.  Patients discharged the day of surgery will not be allowed to drive home.   Name and phone number of your driver:    Special instructions:   [facility]- Preparing For Surgery  Before surgery, you can play an important role. Because skin is not sterile, your skin needs to be as free of germs as possible. You can reduce the number of germs on your skin by washing with CHG (chlorahexidine  gluconate) Soap before surgery.  CHG is an antiseptic cleaner which kills germs and bonds with the skin to continue killing germs even after washing.    Oral Hygiene is also important to reduce your risk of infection.  Remember - BRUSH YOUR TEETH THE MORNING OF SURGERY WITH YOUR REGULAR TOOTHPASTE  Please do not use if you have an allergy to CHG or antibacterial soaps. If your skin becomes reddened/irritated stop using the CHG.  Do not shave (including legs and underarms) for at least 48 hours prior to first CHG shower. It is OK to shave your face.  Please follow these instructions carefully.   1. Shower the NIGHT BEFORE SURGERY and the MORNING OF SURGERY with CHG.   2. If you chose to wash your hair, wash your hair first as usual with your normal shampoo.  3. After you shampoo, rinse your hair and body thoroughly to remove the shampoo.  4. Use CHG as you would any other liquid soap. You can apply CHG directly to the skin and wash gently with a scrungie or a clean washcloth.   5. Apply the CHG Soap to your body ONLY FROM THE NECK DOWN.  Do not use on open wounds or open sores. Avoid contact with your eyes, ears, mouth and genitals (private parts). Wash Face and genitals (private parts)  with your normal soap.  6. Wash thoroughly, paying special attention to the area where your surgery will be performed.  7. Thoroughly rinse your  body with warm water from the neck down.  8. DO NOT shower/wash with your normal soap after using and rinsing off the CHG Soap.  9. Pat yourself dry with a CLEAN TOWEL.  10. Wear CLEAN PAJAMAS to bed the night before surgery, wear comfortable clothes the morning of surgery  11. Place CLEAN SHEETS on your bed the night of your first shower and DO NOT SLEEP WITH PETS.    Day of Surgery: Shower as stated above. Do not apply any deodorants/lotions.  Please wear clean clothes to the hospital/surgery center.   Remember to brush your teeth WITH YOUR REGULAR  TOOTHPASTE.    Please read over the following fact sheets that you were given.

## 2018-01-17 ENCOUNTER — Encounter (HOSPITAL_COMMUNITY): Payer: Self-pay

## 2018-01-17 ENCOUNTER — Other Ambulatory Visit: Payer: Self-pay

## 2018-01-17 ENCOUNTER — Encounter (HOSPITAL_COMMUNITY)
Admission: RE | Admit: 2018-01-17 | Discharge: 2018-01-17 | Disposition: A | Discharge status: Home / Self Care | Payer: 59 | Source: Ambulatory Visit | Attending: Orthopedic Surgery | Admitting: Orthopedic Surgery

## 2018-01-17 DIAGNOSIS — Z01812 Encounter for preprocedural laboratory examination: Secondary | ICD-10-CM | POA: Insufficient documentation

## 2018-01-17 LAB — CBC
HCT: 43.3 % (ref 36.0–46.0)
Hemoglobin: 14.4 g/dL (ref 12.0–15.0)
MCH: 29.1 pg (ref 26.0–34.0)
MCHC: 33.3 g/dL (ref 30.0–36.0)
MCV: 87.7 fL (ref 78.0–100.0)
Platelets: 292 K/uL (ref 150–400)
RBC: 4.94 MIL/uL (ref 3.87–5.11)
RDW: 12.6 % (ref 11.5–15.5)
WBC: 4.9 K/uL (ref 4.0–10.5)

## 2018-01-17 LAB — BASIC METABOLIC PANEL
ANION GAP: 9 (ref 5–15)
BUN: 11 mg/dL (ref 6–20)
CO2: 26 mmol/L (ref 22–32)
Calcium: 9.7 mg/dL (ref 8.9–10.3)
Chloride: 106 mmol/L (ref 101–111)
Creatinine, Ser: 0.78 mg/dL (ref 0.44–1.00)
Glucose, Bld: 98 mg/dL (ref 65–99)
POTASSIUM: 4.2 mmol/L (ref 3.5–5.1)
SODIUM: 141 mmol/L (ref 135–145)

## 2018-01-17 MED FILL — ESTRADIOL 0.1 MG/24HR PTTW: 0.1 | 84 days supply | Qty: 24 | Fill #6

## 2018-01-17 MED FILL — CELECOXIB 200 MG CAP: 200 | 30 days supply | Qty: 30 | Fill #2

## 2018-01-17 MED FILL — ALPRAZolam 0.5 MG TABS: 0.5 | 30 days supply | Qty: 30 | Fill #1

## 2018-01-17 NOTE — Progress Notes (Signed)
PCP - [name] Cardiologist - denies  Chest x-ray - not needed EKG - not needed Stress Test - denies ECHO - denies Cardiac Cath - denies  Anesthesia review: yes due to anesthesia request  Patient denies shortness of breath, fever, cough and chest pain at PAT appointment   Patient verbalized understanding of instructions that were given to them at the PAT appointment. Patient was also instructed that they will need to review over the PAT instructions again at home before surgery.

## 2018-01-19 DIAGNOSIS — E7800 Pure hypercholesterolemia, unspecified: Secondary | ICD-10-CM | POA: Diagnosis not present

## 2018-01-19 DIAGNOSIS — G4700 Insomnia, unspecified: Secondary | ICD-10-CM | POA: Diagnosis not present

## 2018-01-19 DIAGNOSIS — Z800 Family history of malignant neoplasm of digestive organs: Secondary | ICD-10-CM | POA: Diagnosis not present

## 2018-01-19 DIAGNOSIS — E042 Nontoxic multinodular goiter: Secondary | ICD-10-CM | POA: Diagnosis not present

## 2018-01-19 DIAGNOSIS — Z0000 Encounter for general adult medical examination without abnormal findings: Secondary | ICD-10-CM | POA: Diagnosis not present

## 2018-01-24 DIAGNOSIS — Z1231 Encounter for screening mammogram for malignant neoplasm of breast: Secondary | ICD-10-CM | POA: Diagnosis not present

## 2018-01-24 DIAGNOSIS — Z803 Family history of malignant neoplasm of breast: Secondary | ICD-10-CM | POA: Diagnosis not present

## 2018-01-24 MED ORDER — BUPIVACAINE LIPOSOME 1.3 % IJ SUSP
20.0000 mL | INTRAMUSCULAR | Status: DC
  Filled 2018-01-24: qty 20

## 2018-01-25 ENCOUNTER — Ambulatory Visit (HOSPITAL_COMMUNITY): Payer: 59 | Admitting: Certified Registered Nurse Anesthetist

## 2018-01-25 ENCOUNTER — Encounter (HOSPITAL_COMMUNITY): Payer: Self-pay | Admitting: Certified Registered Nurse Anesthetist

## 2018-01-25 ENCOUNTER — Encounter (HOSPITAL_COMMUNITY)
Admission: RE | Disposition: A | Discharge status: Home / Self Care | Payer: Self-pay | Source: Ambulatory Visit | Attending: Orthopedic Surgery

## 2018-01-25 ENCOUNTER — Ambulatory Visit (HOSPITAL_COMMUNITY): Payer: 59 | Admitting: Emergency Medicine

## 2018-01-25 ENCOUNTER — Ambulatory Visit (HOSPITAL_COMMUNITY)
Admission: RE | Admit: 2018-01-25 | Discharge: 2018-01-25 | Disposition: A | Discharge status: Home / Self Care | Payer: 59 | Source: Ambulatory Visit | Attending: Orthopedic Surgery | Admitting: Orthopedic Surgery

## 2018-01-25 DIAGNOSIS — M75101 Unspecified rotator cuff tear or rupture of right shoulder, not specified as traumatic: Secondary | ICD-10-CM | POA: Diagnosis not present

## 2018-01-25 DIAGNOSIS — M19011 Primary osteoarthritis, right shoulder: Secondary | ICD-10-CM | POA: Diagnosis not present

## 2018-01-25 DIAGNOSIS — E785 Hyperlipidemia, unspecified: Secondary | ICD-10-CM | POA: Insufficient documentation

## 2018-01-25 DIAGNOSIS — Z4889 Encounter for other specified surgical aftercare: Secondary | ICD-10-CM | POA: Diagnosis not present

## 2018-01-25 DIAGNOSIS — I890 Lymphedema, not elsewhere classified: Secondary | ICD-10-CM | POA: Diagnosis not present

## 2018-01-25 DIAGNOSIS — G8918 Other acute postprocedural pain: Secondary | ICD-10-CM | POA: Diagnosis not present

## 2018-01-25 DIAGNOSIS — M12811 Other specific arthropathies, not elsewhere classified, right shoulder: Secondary | ICD-10-CM

## 2018-01-25 DIAGNOSIS — M75121 Complete rotator cuff tear or rupture of right shoulder, not specified as traumatic: Secondary | ICD-10-CM | POA: Diagnosis not present

## 2018-01-25 DIAGNOSIS — E039 Hypothyroidism, unspecified: Secondary | ICD-10-CM | POA: Insufficient documentation

## 2018-01-25 DIAGNOSIS — Z79899 Other long term (current) drug therapy: Secondary | ICD-10-CM | POA: Insufficient documentation

## 2018-01-25 DIAGNOSIS — M7541 Impingement syndrome of right shoulder: Secondary | ICD-10-CM | POA: Diagnosis not present

## 2018-01-25 DIAGNOSIS — S43431A Superior glenoid labrum lesion of right shoulder, initial encounter: Secondary | ICD-10-CM | POA: Diagnosis not present

## 2018-01-25 DIAGNOSIS — M25511 Pain in right shoulder: Secondary | ICD-10-CM | POA: Diagnosis not present

## 2018-01-25 HISTORY — PX: SHOULDER ARTHROSCOPY WITH ROTATOR CUFF REPAIR AND SUBACROMIAL DECOMPRESSION: SHX5686

## 2018-01-25 SURGERY — SHOULDER ARTHROSCOPY WITH ROTATOR CUFF REPAIR AND SUBACROMIAL DECOMPRESSION
Anesthesia: General | Site: Shoulder | Laterality: Right

## 2018-01-25 MED ORDER — MIDAZOLAM HCL 2 MG/2ML IJ SOLN
INTRAMUSCULAR | Status: AC
  Filled 2018-01-25: qty 2

## 2018-01-25 MED ORDER — MIDAZOLAM HCL 2 MG/2ML IJ SOLN
2.0000 mg | Freq: Once | INTRAMUSCULAR | Status: AC
  Administered 2018-01-25: 2 mg via INTRAVENOUS

## 2018-01-25 MED ORDER — FENTANYL CITRATE (PF) 100 MCG/2ML IJ SOLN
25.0000 ug | INTRAMUSCULAR | Status: DC | PRN
  Administered 2018-01-25 (×2): 25 ug via INTRAVENOUS

## 2018-01-25 MED ORDER — PHENYLEPHRINE 40 MCG/ML (10ML) SYRINGE FOR IV PUSH (FOR BLOOD PRESSURE SUPPORT)
PREFILLED_SYRINGE | INTRAVENOUS | Status: DC | PRN
  Administered 2018-01-25: 80 ug via INTRAVENOUS
  Administered 2018-01-25: 120 ug via INTRAVENOUS

## 2018-01-25 MED ORDER — DEXAMETHASONE SODIUM PHOSPHATE 10 MG/ML IJ SOLN
INTRAMUSCULAR | Status: DC | PRN
  Administered 2018-01-25: 10 mg via INTRAVENOUS

## 2018-01-25 MED ORDER — FENTANYL CITRATE (PF) 250 MCG/5ML IJ SOLN
INTRAMUSCULAR | Status: AC
  Filled 2018-01-25: qty 5

## 2018-01-25 MED ORDER — MIDAZOLAM HCL 2 MG/2ML IJ SOLN: 0.5000 mg | Freq: Once | INTRAMUSCULAR | Status: DC

## 2018-01-25 MED ORDER — FENTANYL CITRATE (PF) 100 MCG/2ML IJ SOLN
INTRAMUSCULAR | Status: AC
  Filled 2018-01-25: qty 2

## 2018-01-25 MED ORDER — SUGAMMADEX SODIUM 200 MG/2ML IV SOLN
INTRAVENOUS | Status: DC | PRN
  Administered 2018-01-25: 200 mg via INTRAVENOUS

## 2018-01-25 MED ORDER — CEFAZOLIN SODIUM-DEXTROSE 2-4 GM/100ML-% IV SOLN
2.0000 g | INTRAVENOUS | Status: AC
  Administered 2018-01-25: 2 g via INTRAVENOUS

## 2018-01-25 MED ORDER — CHLORHEXIDINE GLUCONATE 4 % EX LIQD: 60.0000 mL | Freq: Once | CUTANEOUS | Status: DC

## 2018-01-25 MED ORDER — BUPIVACAINE HCL (PF) 0.5 % IJ SOLN
INTRAMUSCULAR | Status: DC | PRN
  Administered 2018-01-25: 15 mL via PERINEURAL

## 2018-01-25 MED ORDER — CYCLOBENZAPRINE HCL 10 MG PO TABS: 10.0000 mg | ORAL_TABLET | Freq: Three times a day (TID) | ORAL | 0 refills | Status: AC | PRN

## 2018-01-25 MED ORDER — PROMETHAZINE HCL 25 MG/ML IJ SOLN: 6.2500 mg | INTRAMUSCULAR | Status: DC | PRN

## 2018-01-25 MED ORDER — SCOPOLAMINE 1 MG/3DAYS TD PT72
1.0000 | MEDICATED_PATCH | TRANSDERMAL | Status: DC
  Administered 2018-01-25: 1.5 mg via TRANSDERMAL
  Filled 2018-01-25: qty 1

## 2018-01-25 MED ORDER — CEFAZOLIN SODIUM-DEXTROSE 2-4 GM/100ML-% IV SOLN
INTRAVENOUS | Status: AC
  Filled 2018-01-25: qty 100

## 2018-01-25 MED ORDER — ONDANSETRON HCL 4 MG PO TABS: 4.0000 mg | ORAL_TABLET | Freq: Three times a day (TID) | ORAL | 0 refills | Status: DC | PRN

## 2018-01-25 MED ORDER — STERILE WATER FOR IRRIGATION IR SOLN
Status: DC | PRN
  Administered 2018-01-25: 1000 mL

## 2018-01-25 MED ORDER — ONDANSETRON HCL 4 MG/2ML IJ SOLN
INTRAMUSCULAR | Status: DC | PRN
  Administered 2018-01-25: 4 mg via INTRAVENOUS

## 2018-01-25 MED ORDER — FENTANYL CITRATE (PF) 100 MCG/2ML IJ SOLN
100.0000 ug | Freq: Once | INTRAMUSCULAR | Status: AC
  Administered 2018-01-25: 100 ug via INTRAVENOUS

## 2018-01-25 MED ORDER — DEXAMETHASONE SODIUM PHOSPHATE 10 MG/ML IJ SOLN
INTRAMUSCULAR | Status: AC
  Filled 2018-01-25: qty 1

## 2018-01-25 MED ORDER — ONDANSETRON HCL 4 MG/2ML IJ SOLN
INTRAMUSCULAR | Status: AC
  Filled 2018-01-25: qty 2

## 2018-01-25 MED ORDER — OXYCODONE-ACETAMINOPHEN 5-325 MG PO TABS: 1.0000 | ORAL_TABLET | ORAL | 0 refills | Status: DC | PRN

## 2018-01-25 MED ORDER — PROPOFOL 10 MG/ML IV BOLUS
INTRAVENOUS | Status: DC | PRN
  Administered 2018-01-25: 180 mg via INTRAVENOUS

## 2018-01-25 MED ORDER — FENTANYL CITRATE (PF) 100 MCG/2ML IJ SOLN
INTRAMUSCULAR | Status: DC | PRN
  Administered 2018-01-25: 50 ug via INTRAVENOUS

## 2018-01-25 MED ORDER — PHENYLEPHRINE HCL 10 MG/ML IJ SOLN
INTRAVENOUS | Status: DC | PRN
  Administered 2018-01-25: 20 ug/min via INTRAVENOUS

## 2018-01-25 MED ORDER — SODIUM CHLORIDE 0.9 % IR SOLN
Status: DC | PRN
  Administered 2018-01-25: 12000 mL

## 2018-01-25 MED ORDER — BUPIVACAINE LIPOSOME 1.3 % IJ SUSP
INTRAMUSCULAR | Status: DC | PRN
  Administered 2018-01-25: 10 mL via PERINEURAL

## 2018-01-25 MED ORDER — CELECOXIB 200 MG PO CAPS: 200.0000 mg | ORAL_CAPSULE | Freq: Every evening | ORAL | 1 refills | Status: DC

## 2018-01-25 MED ORDER — ROCURONIUM BROMIDE 100 MG/10ML IV SOLN
INTRAVENOUS | Status: DC | PRN
  Administered 2018-01-25: 60 mg via INTRAVENOUS

## 2018-01-25 MED ORDER — LACTATED RINGERS IV SOLN
INTRAVENOUS | Status: DC
  Administered 2018-01-25: 50 mL/h via INTRAVENOUS

## 2018-01-25 MED FILL — OXYCODONE-ACETAMINOPHEN 5-3: 5-325 | 5 days supply | Qty: 30 | Fill #0

## 2018-01-25 MED FILL — CYCLOBENZAPRINE 10 MG TAB: 10 | 10 days supply | Qty: 30 | Fill #0

## 2018-01-25 MED FILL — ONDANSETRON HCL 4 MG TABLET: 4 | 3 days supply | Qty: 10 | Fill #0

## 2018-01-25 SURGICAL SUPPLY — 60 items
ANCH SUT SWLK 19.1X4.75 VT (Anchor) ×3 IMPLANT
ANCHOR PEEK 4.75X19.1 SWLK C (Anchor) ×3 IMPLANT
BLADE CUTTER GATOR 3.5 (BLADE) ×2 IMPLANT
BLADE GREAT WHITE 4.2 (BLADE) ×2 IMPLANT
BLADE SURG 11 STRL SS (BLADE) ×2 IMPLANT
BOOTCOVER CLEANROOM LRG (PROTECTIVE WEAR) ×4 IMPLANT
BUR OVAL 4.0 (BURR) ×2 IMPLANT
CANISTER SUCT LVC 12 LTR MEDI- (MISCELLANEOUS) ×2 IMPLANT
CANNULA ACUFLEX KIT 5X76 (CANNULA) ×2 IMPLANT
CANNULA DRILOCK 5.0X75 (CANNULA) ×1 IMPLANT
CANNULA TWIST IN 8.25X7CM (CANNULA) ×1 IMPLANT
CONNECTOR 5 IN 1 STRAIGHT STRL (MISCELLANEOUS) ×2 IMPLANT
DRAPE INCISE 23X17 IOBAN STRL (DRAPES)
DRAPE INCISE 23X17 STRL (DRAPES) IMPLANT
DRAPE INCISE IOBAN 23X17 STRL (DRAPES) IMPLANT
DRAPE INCISE IOBAN 66X45 STRL (DRAPES) ×2 IMPLANT
DRAPE ORTHO SPLIT 77X108 STRL (DRAPES) ×4
DRAPE STERI 35X30 U-POUCH (DRAPES) ×2 IMPLANT
DRAPE SURG 17X11 SM STRL (DRAPES) ×2 IMPLANT
DRAPE SURG ORHT 6 SPLT 77X108 (DRAPES) ×2 IMPLANT
DRAPE U-SHAPE 47X51 STRL (DRAPES) ×2 IMPLANT
DRSG PAD ABDOMINAL 8X10 ST (GAUZE/BANDAGES/DRESSINGS) ×4 IMPLANT
DURAPREP 26ML APPLICATOR (WOUND CARE) ×4 IMPLANT
GAUZE SPONGE 4X4 12PLY STRL (GAUZE/BANDAGES/DRESSINGS) ×2 IMPLANT
GLOVE BIO SURGEON STRL SZ7.5 (GLOVE) ×2 IMPLANT
GLOVE BIO SURGEON STRL SZ8 (GLOVE) ×2 IMPLANT
GLOVE EUDERMIC 7 POWDERFREE (GLOVE) ×2 IMPLANT
GLOVE SS BIOGEL STRL SZ 7.5 (GLOVE) ×1 IMPLANT
GLOVE SUPERSENSE BIOGEL SZ 7.5 (GLOVE) ×1
GOWN STRL REUS W/ TWL LRG LVL3 (GOWN DISPOSABLE) ×1 IMPLANT
GOWN STRL REUS W/ TWL XL LVL3 (GOWN DISPOSABLE) ×2 IMPLANT
GOWN STRL REUS W/TWL LRG LVL3 (GOWN DISPOSABLE) ×2
GOWN STRL REUS W/TWL XL LVL3 (GOWN DISPOSABLE) ×4
KIT BASIN OR (CUSTOM PROCEDURE TRAY) ×2 IMPLANT
KIT SHOULDER TRACTION (DRAPES) ×2 IMPLANT
KIT TURNOVER KIT B (KITS) ×2 IMPLANT
MANIFOLD NEPTUNE II (INSTRUMENTS) ×2 IMPLANT
NDL SCORPION MULTI FIRE (NEEDLE) IMPLANT
NDL SPNL 18GX3.5 QUINCKE PK (NEEDLE) ×1 IMPLANT
NEEDLE SCORPION MULTI FIRE (NEEDLE) ×2 IMPLANT
NEEDLE SPNL 18GX3.5 QUINCKE PK (NEEDLE) ×2 IMPLANT
NS IRRIG 1000ML POUR BTL (IV SOLUTION) ×2 IMPLANT
PACK SHOULDER (CUSTOM PROCEDURE TRAY) ×2 IMPLANT
PAD ABD 8X10 STRL (GAUZE/BANDAGES/DRESSINGS) ×1 IMPLANT
PAD ARMBOARD 7.5X6 YLW CONV (MISCELLANEOUS) ×4 IMPLANT
SET ARTHROSCOPY TUBING (MISCELLANEOUS) ×2
SET ARTHROSCOPY TUBING LN (MISCELLANEOUS) ×1 IMPLANT
SLING ARM FOAM STRAP LRG (SOFTGOODS) IMPLANT
SLING ARM FOAM STRAP MED (SOFTGOODS) ×2 IMPLANT
SPONGE LAP 4X18 RFD (DISPOSABLE) IMPLANT
STRIP CLOSURE SKIN 1/2X4 (GAUZE/BANDAGES/DRESSINGS) ×2 IMPLANT
SUT MNCRL AB 3-0 PS2 18 (SUTURE) ×2 IMPLANT
SUT PDS AB 0 CT 36 (SUTURE) IMPLANT
SUT TIGER TAPE 7 IN WHITE (SUTURE) IMPLANT
TAPE FIBER 2MM 7IN #2 BLUE (SUTURE) IMPLANT
TAPE PAPER 3X10 WHT MICROPORE (GAUZE/BANDAGES/DRESSINGS) ×2 IMPLANT
TOWEL OR 17X24 6PK STRL BLUE (TOWEL DISPOSABLE) ×2 IMPLANT
TOWEL OR 17X26 10 PK STRL BLUE (TOWEL DISPOSABLE) ×2 IMPLANT
WAND SUCTION MAX 4MM 90S (SURGICAL WAND) ×2 IMPLANT
WATER STERILE IRR 1000ML POUR (IV SOLUTION) ×2 IMPLANT

## 2018-01-25 NOTE — Anesthesia Preprocedure Evaluation (Addendum)
Anesthesia Evaluation  Patient identified by MRN, date of birth, ID band Patient awake    Reviewed: Allergy & Precautions, NPO status , Patient's Chart, lab work & pertinent test results  History of Anesthesia Complications Negative for: history of anesthetic complications  Airway Mallampati: II  TM Distance: >3 FB Neck ROM: Full    Dental no notable dental hx. (+) Dental Advisory Given   Pulmonary neg pulmonary ROS,    Pulmonary exam normal        Cardiovascular negative cardio ROS Normal cardiovascular exam     Neuro/Psych negative neurological ROS  negative psych ROS   GI/Hepatic negative GI ROS, Neg liver ROS,   Endo/Other  Hypothyroidism   Renal/GU negative Renal ROS  negative genitourinary   Musculoskeletal negative musculoskeletal ROS (+)   Abdominal   Peds negative pediatric ROS (+)  Hematology negative hematology ROS (+)   Anesthesia Other Findings   Reproductive/Obstetrics negative OB ROS                            Anesthesia Physical Anesthesia Plan  ASA: II  Anesthesia Plan: General   Post-op Pain Management: GA combined w/ Regional for post-op pain   Induction: Intravenous  PONV Risk Score and Plan: 3 and Ondansetron, Dexamethasone and Scopolamine patch - Pre-op  Airway Management Planned: Oral ETT  Additional Equipment:   Intra-op Plan:   Post-operative Plan: Extubation in OR  Informed Consent: I have reviewed the patients History and Physical, chart, labs and discussed the procedure including the risks, benefits and alternatives for the proposed anesthesia with the patient or authorized representative who has indicated his/her understanding and acceptance.   Dental advisory given  Plan Discussed with: CRNA, Anesthesiologist and Surgeon  Anesthesia Plan Comments:        Anesthesia Quick Evaluation

## 2018-01-25 NOTE — Discharge Instructions (Signed)
° °Kevin M. Supple, M.D., F.A.A.O.S. °Orthopaedic Surgery °Specializing in Arthroscopic and Reconstructive °Surgery of the Shoulder and Knee °[phone] °[address] - [place],  [address] - Fax [phone] ° °POST-OP SHOULDER ARTHROSCOPIC ROTATOR CUFF REPAIR INSTRUCTIONS ° °1. Call the office at [phone] to schedule your first post-op appointment 7-10 days from the date of your surgery. ° °2. Leave the steri-strips in place over your incisions when performing dressing changes and showering. You may remove your dressings and begin showering 72 hours from surgery. You can expect drainage that is clear to bloody in nature that occasionally will soak through your dressings. If this occurs go ahead and perform a dressing change. The drainage should lessen daily and when there is no drainage from your incisions feel free to go without a dressing. ° °3. Wear your sling/immobilizer at all times except to perform the exercises below or to occasionally let your arm dangle by your side to stretch your elbow. You also need to sleep in your sling immobilizer until instructed otherwise. ° °4. Range of motion to your elbow, wrist, and hand are encouraged 3-5 times daily. Exercise to your hand and fingers helps to reduce swelling you may experience. ° °5. Utilize ice to the shoulder 3-4 times minimum a day and additionally if you are experiencing pain. ° °6. You may one-armed drive when safely off of narcotics and muscle relaxants. You may use your hand that is in the sling to support the steering wheel only. However, should it be your right arm that is in the sling it is not to be used for gear shifting in a manual transmission. ° °7. If you had a block pre-operatively to provide post-op pain relief you may want to go ahead and begin utilizing your pain meds as your arm begins to wake up. Blocks can sometimes last up to 16-18 hours. If you are still pain-free prior to going to bed you may want to  strongly consider taking a pain medication to avoid being awakened in the night with the onset of pain. A muscle relaxant is also provided for you should you experience muscle spasms. It is recommended that if you are experiencing pain that your pain medication alone is not controlling, add the muscle relaxant along with the pain medication which can give additional pain relief. The first one to two days is generally the most severe of your pain and then should gradually decrease. As your pain lessens it is recommended that you decrease your use of the pain medications to an "as needed basis" only and to always comply with the recommended dosages of the pain medications. ° °8. Pain medications can produce constipation along with their use. If you experience this, the use of an over the counter stool softener or laxative daily is recommended.  ° °9. For additional questions or concerns, please do not hesitate to call the office. If after hours there is an answering service to forward your concerns to the physician on call. ° °10.Pain control following an exparel block ° °To help control your post-operative pain you received a nerve block  performed with Exparel which is a long acting anesthetic (numbing agent) which can provide pain relief and sensations of numbness (and relief of pain) in the operative shoulder and arm for up to 3 days. Sometimes it provides mixed relief, meaning you may still have numbness in certain areas of the arm but can still be able to move  parts of that arm, hand, and   fingers. We recommend that your prescribed pain medications  be used as needed. We do not feel it is necessary to "pre medicate" and "stay ahead" of pain.  Taking narcotic pain medications when you are not having any pain can lead to unnecessary and potentially dangerous side effects.  ° °POST-OP EXERCISES ° °Pendulum Exercises ° °Perform pendulum exercises while standing and bending at the waist. Support your uninvolved arm  on a table or chair and allow your operated arm to hang freely. Make sure to do these exercises passively - not using you shoulder muscle. ° °Repeat 20 times. Do 3 sessions per day. ° ° °

## 2018-01-25 NOTE — Op Note (Signed)
01/25/2018  12:17 PM  PATIENT:   Erin Hall  57 y.o. female  PRE-OPERATIVE DIAGNOSIS:  Right shoulder Impingement, acromioclavicular joint osteoarthritis, rotator cuff tear  POST-OPERATIVE DIAGNOSIS:  Same with labral tear  PROCEDURE:  RSA, labral debridement, SAD, DCR, RCR  SURGEON:  Lenix Benoist, Metta Clines M.D.  ASSISTANTS: Shuford pac   ANESTHESIA:   GET + ISB  EBL: min  SPECIMEN:  none  Drains: none   PATIENT DISPOSITION:  PACU - hemodynamically stable.    PLAN OF CARE: Discharge to home after PACU   Brief history:  [name] has been followed for chronic right shoulder pain with impingement syndrome and MRI evidence for full-thickness rotator cuff tear.  Her symptoms have progressed to the point that she is having unrelenting shoulder pain which is significantly impacting her quality of life and ability to perform activities of daily living.  She is brought to the operating this time for planned right shoulder arthroscopy with rotator cuff repair as described below  Preoperatively I counseled the patient regarding treatment options as well as the potential risks versus benefits thereof.  Possible surgical complications are all reviewed including the potential for bleeding, infection, neurovascular injury, persistent pain, loss of motion, recurrence of rotator cuff tear, anesthetic complication, and possible need for additional surgery.  She understands and accepts and agrees with her planned procedure.  Procedure in detail after undergoing routine preop evaluation patient received prophylactic antibiotics and interscalene block was established in the holding area by the anesthesia department.  Placed supine on the operative table underwent smooth induction of a general endotracheal anesthesia.  Placed in the  lateral decubitus position on the beanbag and appropriately padded protected.  Right shoulder examination under anesthesia revealed full motion and no instability patterns were  noted.  Right arm was then suspended at 70 degrees of abduction with 10 pounds of traction of the right shoulder girdle region was sterilely prepped and draped in standard fashion.  Timeout was called.  A posterior portal was established into the glenohumeral joint intraportal steps under direct visualization.  The articular surfaces were all found to be in excellent condition.  Capsular volume was within normal limits.  Moderate synovitis was noted we performed a limited synovectomy and in addition debrided a anterior superior labral tear.  The biceps tendon was of normal caliber with no evidence for proximal or distal instability.  Rotator cuff tear was noted involving the distal portion of the supraspinatus posteriorly.  This is debrided from the articular side.  The balance of the humeral joint showed no additional pathologies.  Fluid and instruments were then removed.  There was stepped down to 30 degrees of abduction arthroscope introduced in the subacromial space the posterior portal and a direct lateral portal established into the subacromial space.  Abundant dense bursal tissue multiple adhesions were encountered and these were all divided and excised with combination the [employer].  Once then used to remove the periosteum from the undersurface of the anterior half of the acromion and subacromial decompression was performed with a bur critical type I morphology.  Portals does have a strictly anterior to the distal clavicle and distal clavicle resection was performed with a bur with care taken to confirm visualization of the entire circumference of the distal clavicle to ensure adequate removal of bone in the proximal 8 mm was excised.  We then completed the subacromial/subdeltoid bursectomy.  The rotator cuff tear was readily identified this is debrided with shaver back to healthy  tissue and the footprint on the tuberosity was then cleaned of residual soft tissue and bone was abraded to bleeding  bed with footprint approximately 2 cm in width.  Through a stab wound on the lateral margin we placed an Arthrex peek swivel lock suture anchor lead with 2 fiber tapes.  The fiber tapes were then shuttled through the free margin of the rotator cuff tear equidistant across the width of the tear using the scorpion suture passer and then to the lateral row anchors were utilized to create a double row repair which nicely brought the rotator cuff tendon into compression against the footprint on the tuberosity with the overall construct much to our satisfaction.  Suture limbs were all then closed.  Bursectomy was completed.  Hemostasis was obtained.  Fluid analysis removed.  The portals were closed with Monocryl and a Steri-Strip  A dry dressing was taped over the right shoulder and the right arm was placed into a sling immobilizer and the patient was awakened extubated and taken recovery in in stable condition.  Tracy sugar PA-C was used as an Environmental consultant throughout this case essential for help with positioning the patient, position extremity, tissue manipulation, suture management, wound closure, and intraoperative decision-making.  Kipp Laurence # [phone]

## 2018-01-25 NOTE — Anesthesia Postprocedure Evaluation (Signed)
Anesthesia Post Note  Patient: Kendy Haston  Procedure(s) Performed: RIGHT SHOULDER ARTHROSCOPY WITH ROTATOR CUFF REPAIR AND SUBACROMIAL DECOMPRESSION, DISTAL CLAVICLE RESECTION (Right Shoulder)     Patient location during evaluation: PACU Anesthesia Type: General Level of consciousness: sedated Pain management: pain level controlled Vital Signs Assessment: post-procedure vital signs reviewed and stable Respiratory status: spontaneous breathing and respiratory function stable Cardiovascular status: stable Postop Assessment: no apparent nausea or vomiting Anesthetic complications: no    Last Vitals:  Vitals:   01/25/18 1336 01/25/18 1337  BP:    Pulse: 78 76  Resp: 17 19  Temp:    SpO2: 96%     Last Pain:  Vitals:   01/25/18 1337  TempSrc:   PainSc: 1                  Raley Novicki DANIEL

## 2018-01-25 NOTE — Anesthesia Procedure Notes (Signed)
Anesthesia Regional Block: Interscalene brachial plexus block   Pre-Anesthetic Checklist: ,, timeout performed, Correct Patient, Correct Site, Correct Laterality, Correct Procedure, Correct Position, site marked, Risks and benefits discussed,  Surgical consent,  Pre-op evaluation,  At surgeon's request and post-op pain management  Laterality: Right  Prep: chloraprep       Needles:  Injection technique: Single-shot  Needle Type: Echogenic Stimulator Needle     Needle Length: 5cm  Needle Gauge: 22     Additional Needles:   Narrative:  Start time: 01/25/2018 9:27 AM End time: 01/25/2018 9:37 AM Injection made incrementally with aspirations every 5 mL.  Performed by: Personally  Anesthesiologist: Duane Boston, MD  Additional Notes: Functioning IV was confirmed and monitors applied.  A 21mm 22ga echogenic arrow stimulator was used. Sterile prep and drape,hand hygiene and sterile gloves were used.Ultrasound guidance: relevant anatomy identified, needle position confirmed, local anesthetic spread visualized around nerve(s)., vascular puncture avoided.  Image printed for medical record.  Negative aspiration and negative test dose prior to incremental administration of local anesthetic. The patient tolerated the procedure well.

## 2018-01-25 NOTE — H&P (Signed)
Erin Hall    Chief Complaint: Right shoulder Impingement, acromioclavicular joint osteoarthritis, rotator cuff tear HPI: The patient is a 57 y.o. female with chronic right shoulder pain and impingement syndrome with MRI evidence of full thickness rotator cuff tear  Past Medical History:  Diagnosis Date  . History of cellulitis    DOG BITE RIGHT THIGH 2002  . History of seasonal allergies   . History of uterine fibroid   . Hyperlipidemia    hx of  . Hypothyroidism   . Right knee meniscal tear     Past Surgical History:  Procedure Laterality Date  . BILATERAL SALPINGECTOMY N/A 12/12/2013   Procedure: BILATERAL SALPINGECTOMY;  Surgeon: Marvene Staff, MD;  Location: [facility];  Service: Gynecology;  Laterality: N/A;  . BREAST REDUCTION SURGERY Bilateral 02-08-2007  . COLONOSCOPY    . I & D RIGHT THIGH ABSCESS  12-04-2000  . KNEE ARTHROSCOPY Left several -- last one 2008  . KNEE ARTHROSCOPY Right 08/20/2015   Procedure: ARTHROSCOPY RIGHT KNEE/ MEDIAL AND LATERAL PARTIAL MENISCECTOMY/ MEDIAL AND LATERAL PARTIAL CHONDROPLASTY/ BILATERAL [place] INJECTION;  Surgeon: Paralee Cancel, MD;  Location: [facility];  Service: Orthopedics;  Laterality: Right;  AND BILATERAL HIPS  . KNEE SURGERY Left age 61   osteomalacia  . ROBOTIC ASSISTED TOTAL HYSTERECTOMY N/A 12/12/2013   Procedure: ROBOTIC ASSISTED TOTAL HYSTERECTOMY;  Surgeon: Marvene Staff, MD;  Location: [facility];  Service: Gynecology;  Laterality: N/A;  . TONSILLECTOMY    . UTERINE ARTERY EMBOLIZATION  04/17/2012  . WISDOM TOOTH EXTRACTION      History reviewed. No pertinent family history.  Social History:  reports that she has never smoked. She has never used smokeless tobacco. She reports that she does not drink alcohol or use drugs.   Medications Prior to Admission  Medication Sig Dispense Refill  . ALPRAZolam (XANAX) 0.5 MG tablet Take 0.5 mg by mouth at bedtime as needed for anxiety or sleep.    . Biotin  w/ Vitamins C & E (HAIR/SKIN/NAILS PO) Take 1 tablet by mouth daily.    . celecoxib (CELEBREX) 200 MG capsule Take 200 mg by mouth every evening.     . cetirizine (ZYRTEC) 10 MG tablet Take 10 mg by mouth every evening.    . Cholecalciferol (VITAMIN D3) 5000 units CAPS Take 10,000 Units by mouth daily.    . fish oil-omega-3 fatty acids 1000 MG capsule Take 1 g by mouth every evening.    Marland Kitchen levothyroxine (SYNTHROID, LEVOTHROID) 50 MCG tablet Take 50 mcg by mouth daily before breakfast.   12  . magnesium oxide (MAG-OX) 400 MG tablet Take 400 mg by mouth daily.    Marland Kitchen MINIVELLE 0.1 MG/24HR patch Place 1 patch onto the skin 2 (two) times a week. Wednesdays and Saturdays.  2  . Multiple Vitamin (MULTIVITAMIN WITH MINERALS) TABS Take 1 tablet by mouth every evening.    . vitamin C (ASCORBIC ACID) 500 MG tablet Take 500 mg by mouth every evening.    Marland Kitchen aspirin EC 325 MG tablet Take 1 tablet (325 mg total) by mouth daily. (Patient not taking: Reported on 01/17/2018) 30 tablet 0  . HYDROcodone-acetaminophen (NORCO) 5-325 MG tablet Take 1-2 tablets by mouth every 6 (six) hours as needed. (Patient not taking: Reported on 01/17/2018) 90 tablet 0     Physical Exam: right shoulder with painful and restricted motion as noted at recent office visits  Vitals  Temp:  [98.2 F (36.8 C)] 98.2 F (36.8 C) (  06/06 0833) Pulse Rate:  [68-79] 70 (06/06 0945) Resp:  [13-18] 13 (06/06 0945) BP: (130-150)/(91-95) 150/91 (06/06 0945) SpO2:  [99 %-100 %] 100 % (06/06 0945) Weight:  [71.3 kg (157 lb 3 oz)] 71.3 kg (157 lb 3 oz) (06/06 4656)  Assessment/Plan  Impression: Right shoulder Impingement, acromioclavicular joint osteoarthritis, rotator cuff tear  Plan of Action: Procedure(s): RIGHT SHOULDER ARTHROSCOPY WITH ROTATOR CUFF REPAIR AND SUBACROMIAL DECOMPRESSION, DISTAL CLAVICLE RESECTION  Erin Hall M Erin Hall 01/25/2018, 10:18 AM Contact # [phone]

## 2018-01-25 NOTE — Progress Notes (Signed)
Orthopedic Tech Progress Note Patient Details:  Erin Hall 28-Jun-1961 [id number]  Ortho Devices Type of Ortho Device: Shoulder abduction pillow       [place] Pink 01/25/2018, 11:56 AM

## 2018-01-25 NOTE — Transfer of Care (Signed)
Immediate Anesthesia Transfer of Care Note  Patient: [name]  Procedure(s) Performed: RIGHT SHOULDER ARTHROSCOPY WITH ROTATOR CUFF REPAIR AND SUBACROMIAL DECOMPRESSION, DISTAL CLAVICLE RESECTION (Right Shoulder)  Patient Location: PACU  Anesthesia Type:GA combined with regional for post-op pain  Level of Consciousness: awake, alert  and oriented  Airway & Oxygen Therapy: Patient Spontanous Breathing and Patient connected to nasal cannula oxygen  Post-op Assessment: Report given to RN and Post -op Vital signs reviewed and stable  Post vital signs: Reviewed and stable  Last Vitals:  Vitals Value Taken Time  BP 138/123 01/25/2018 12:45 PM  Temp    Pulse 77 01/25/2018 12:49 PM  Resp 19 01/25/2018 12:49 PM  SpO2 91 % 01/25/2018 12:49 PM  Vitals shown include unvalidated device data.  Last Pain:  Vitals:   01/25/18 0945  TempSrc:   PainSc: 0-No pain         Complications: No apparent anesthesia complications

## 2018-01-25 NOTE — Anesthesia Procedure Notes (Signed)
Procedure Name: Intubation Date/Time: 01/25/2018 10:58 AM Performed by: Candis Shine, CRNA Pre-anesthesia Checklist: Patient identified, Emergency Drugs available, Suction available and Patient being monitored Patient Re-evaluated:Patient Re-evaluated prior to induction Oxygen Delivery Method: Circle System Utilized Preoxygenation: Pre-oxygenation with 100% oxygen Induction Type: IV induction Ventilation: Mask ventilation without difficulty Laryngoscope Size: Mac and 3 Grade View: Grade II Tube type: Oral Tube size: 7.0 mm Number of attempts: 1 Airway Equipment and Method: Stylet Placement Confirmation: ETT inserted through vocal cords under direct vision,  positive ETCO2 and breath sounds checked- equal and bilateral Secured at: 22 cm Tube secured with: Tape Dental Injury: Teeth and Oropharynx as per pre-operative assessment

## 2018-01-29 ENCOUNTER — Encounter (HOSPITAL_COMMUNITY): Payer: Self-pay | Admitting: Orthopedic Surgery

## 2018-02-02 DIAGNOSIS — M25511 Pain in right shoulder: Secondary | ICD-10-CM | POA: Diagnosis not present

## 2018-02-07 DIAGNOSIS — M25511 Pain in right shoulder: Secondary | ICD-10-CM | POA: Diagnosis not present

## 2018-02-09 DIAGNOSIS — M75121 Complete rotator cuff tear or rupture of right shoulder, not specified as traumatic: Secondary | ICD-10-CM | POA: Diagnosis not present

## 2018-02-09 DIAGNOSIS — M7541 Impingement syndrome of right shoulder: Secondary | ICD-10-CM | POA: Diagnosis not present

## 2018-02-09 DIAGNOSIS — M25511 Pain in right shoulder: Secondary | ICD-10-CM | POA: Diagnosis not present

## 2018-02-09 DIAGNOSIS — I890 Lymphedema, not elsewhere classified: Secondary | ICD-10-CM | POA: Diagnosis not present

## 2018-02-09 DIAGNOSIS — Z4889 Encounter for other specified surgical aftercare: Secondary | ICD-10-CM | POA: Diagnosis not present

## 2018-02-09 DIAGNOSIS — M19011 Primary osteoarthritis, right shoulder: Secondary | ICD-10-CM | POA: Diagnosis not present

## 2018-02-14 MED FILL — CELECOXIB 200 MG CAP: 200 | 30 days supply | Qty: 30 | Fill #0

## 2018-02-15 DIAGNOSIS — I890 Lymphedema, not elsewhere classified: Secondary | ICD-10-CM | POA: Diagnosis not present

## 2018-02-15 DIAGNOSIS — M25511 Pain in right shoulder: Secondary | ICD-10-CM | POA: Diagnosis not present

## 2018-02-15 DIAGNOSIS — Z4889 Encounter for other specified surgical aftercare: Secondary | ICD-10-CM | POA: Diagnosis not present

## 2018-02-15 DIAGNOSIS — M19011 Primary osteoarthritis, right shoulder: Secondary | ICD-10-CM | POA: Diagnosis not present

## 2018-02-15 DIAGNOSIS — M7541 Impingement syndrome of right shoulder: Secondary | ICD-10-CM | POA: Diagnosis not present

## 2018-02-15 DIAGNOSIS — M75121 Complete rotator cuff tear or rupture of right shoulder, not specified as traumatic: Secondary | ICD-10-CM | POA: Diagnosis not present

## 2018-02-16 MED FILL — ALPRAZolam 0.5 MG TABS: 0.5 | 30 days supply | Qty: 30 | Fill #2

## 2018-02-28 DIAGNOSIS — M25511 Pain in right shoulder: Secondary | ICD-10-CM | POA: Diagnosis not present

## 2018-03-02 DIAGNOSIS — Z4889 Encounter for other specified surgical aftercare: Secondary | ICD-10-CM | POA: Diagnosis not present

## 2018-03-02 DIAGNOSIS — M19011 Primary osteoarthritis, right shoulder: Secondary | ICD-10-CM | POA: Diagnosis not present

## 2018-03-02 DIAGNOSIS — I890 Lymphedema, not elsewhere classified: Secondary | ICD-10-CM | POA: Diagnosis not present

## 2018-03-02 DIAGNOSIS — M25511 Pain in right shoulder: Secondary | ICD-10-CM | POA: Diagnosis not present

## 2018-03-02 DIAGNOSIS — M7541 Impingement syndrome of right shoulder: Secondary | ICD-10-CM | POA: Diagnosis not present

## 2018-03-02 DIAGNOSIS — M75121 Complete rotator cuff tear or rupture of right shoulder, not specified as traumatic: Secondary | ICD-10-CM | POA: Diagnosis not present

## 2018-03-07 DIAGNOSIS — M25511 Pain in right shoulder: Secondary | ICD-10-CM | POA: Diagnosis not present

## 2018-03-09 DIAGNOSIS — M25511 Pain in right shoulder: Secondary | ICD-10-CM | POA: Diagnosis not present

## 2018-03-10 MED FILL — CELECOXIB 200 MG CAP: 200 | 30 days supply | Qty: 30 | Fill #1

## 2018-03-14 DIAGNOSIS — M25511 Pain in right shoulder: Secondary | ICD-10-CM | POA: Diagnosis not present

## 2018-03-21 DIAGNOSIS — M25511 Pain in right shoulder: Secondary | ICD-10-CM | POA: Diagnosis not present

## 2018-03-23 DIAGNOSIS — M25511 Pain in right shoulder: Secondary | ICD-10-CM | POA: Diagnosis not present

## 2018-03-27 MED FILL — LEVOTHYROXINE 50 MCG TABLET: 50 | 90 days supply | Qty: 90 | Fill #2

## 2018-03-28 DIAGNOSIS — M25511 Pain in right shoulder: Secondary | ICD-10-CM | POA: Diagnosis not present

## 2018-03-28 MED FILL — ALPRAZolam 0.5 MG TABS: 0.5 | 30 days supply | Qty: 30 | Fill #0

## 2018-03-30 DIAGNOSIS — M25511 Pain in right shoulder: Secondary | ICD-10-CM | POA: Diagnosis not present

## 2018-04-03 MED FILL — CELECOXIB 200 MG CAP: 200 | 30 days supply | Qty: 30 | Fill #0

## 2018-04-04 DIAGNOSIS — M25511 Pain in right shoulder: Secondary | ICD-10-CM | POA: Diagnosis not present

## 2018-04-06 DIAGNOSIS — M25511 Pain in right shoulder: Secondary | ICD-10-CM | POA: Diagnosis not present

## 2018-04-18 DIAGNOSIS — M25511 Pain in right shoulder: Secondary | ICD-10-CM | POA: Diagnosis not present

## 2018-04-20 DIAGNOSIS — M25511 Pain in right shoulder: Secondary | ICD-10-CM | POA: Diagnosis not present

## 2018-05-14 MED FILL — CELECOXIB 200 MG CAP: 200 | 30 days supply | Qty: 30 | Fill #1

## 2018-05-14 MED FILL — ALPRAZolam 0.5 MG TABS: 0.5 | 30 days supply | Qty: 30 | Fill #1

## 2018-06-01 MED FILL — ESTRADIOL 0.1 MG/24HR PTTW: 0.1 | 28 days supply | Qty: 8 | Fill #0

## 2018-06-06 DIAGNOSIS — M25511 Pain in right shoulder: Secondary | ICD-10-CM | POA: Diagnosis not present

## 2018-06-07 MED FILL — CELECOXIB 200 MG CAP: 200 | 30 days supply | Qty: 30 | Fill #2

## 2018-06-12 DIAGNOSIS — M25511 Pain in right shoulder: Secondary | ICD-10-CM | POA: Diagnosis not present

## 2018-06-13 MED FILL — ALPRAZolam 0.5 MG TABS: 0.5 | 30 days supply | Qty: 30 | Fill #2

## 2018-06-15 DIAGNOSIS — M25511 Pain in right shoulder: Secondary | ICD-10-CM | POA: Diagnosis not present

## 2018-06-18 DIAGNOSIS — Z4789 Encounter for other orthopedic aftercare: Secondary | ICD-10-CM | POA: Diagnosis not present

## 2018-06-18 DIAGNOSIS — M7541 Impingement syndrome of right shoulder: Secondary | ICD-10-CM | POA: Diagnosis not present

## 2018-06-18 DIAGNOSIS — Z9889 Other specified postprocedural states: Secondary | ICD-10-CM | POA: Diagnosis not present

## 2018-06-21 DIAGNOSIS — K5900 Constipation, unspecified: Secondary | ICD-10-CM | POA: Diagnosis not present

## 2018-06-21 DIAGNOSIS — Z86010 Personal history of colonic polyps: Secondary | ICD-10-CM | POA: Diagnosis not present

## 2018-06-21 DIAGNOSIS — Z1211 Encounter for screening for malignant neoplasm of colon: Secondary | ICD-10-CM | POA: Diagnosis not present

## 2018-06-21 DIAGNOSIS — Z800 Family history of malignant neoplasm of digestive organs: Secondary | ICD-10-CM | POA: Diagnosis not present

## 2018-06-21 DIAGNOSIS — M25511 Pain in right shoulder: Secondary | ICD-10-CM | POA: Diagnosis not present

## 2018-06-27 MED FILL — AMOXICILLIN 500 MG CAPSULE: 500 | 7 days supply | Qty: 21 | Fill #0

## 2018-06-27 MED FILL — DEXAMETHASONE 4 MG TABLET: 4 | 3 days supply | Qty: 9 | Fill #0

## 2018-06-27 MED FILL — IBUPROFEN 400 MG TABS: 400 | 5 days supply | Qty: 30 | Fill #0

## 2018-06-27 MED FILL — HYDROCODON-APAP 5-325: 5-325 | 2 days supply | Qty: 10 | Fill #0

## 2018-06-29 DIAGNOSIS — M25511 Pain in right shoulder: Secondary | ICD-10-CM | POA: Diagnosis not present

## 2018-06-29 MED FILL — GAVILYTE-G SOLUTION: 236 | 1 days supply | Qty: 4000 | Fill #0

## 2018-07-04 DIAGNOSIS — Z1211 Encounter for screening for malignant neoplasm of colon: Secondary | ICD-10-CM | POA: Diagnosis not present

## 2018-07-04 DIAGNOSIS — Z800 Family history of malignant neoplasm of digestive organs: Secondary | ICD-10-CM | POA: Diagnosis not present

## 2018-07-12 ENCOUNTER — Encounter: Payer: Self-pay | Admitting: Genetic Counselor

## 2018-07-13 DIAGNOSIS — Z6828 Body mass index (BMI) 28.0-28.9, adult: Secondary | ICD-10-CM | POA: Diagnosis not present

## 2018-07-13 DIAGNOSIS — Z01419 Encounter for gynecological examination (general) (routine) without abnormal findings: Secondary | ICD-10-CM | POA: Diagnosis not present

## 2018-07-13 MED FILL — ESTRADIOL 0.1 MG/24HR PTTW: 0.1 | 28 days supply | Qty: 8 | Fill #1

## 2018-07-13 MED FILL — CELECOXIB 200 MG CAP: 200 | 30 days supply | Qty: 30 | Fill #0

## 2018-07-16 MED FILL — LEVOTHYROXINE 50 MCG TABLET: 50 | 90 days supply | Qty: 90 | Fill #0

## 2018-07-30 ENCOUNTER — Inpatient Hospital Stay: Payer: 59 | Admitting: Genetic Counselor

## 2018-07-30 ENCOUNTER — Other Ambulatory Visit: Payer: 59

## 2018-08-01 ENCOUNTER — Inpatient Hospital Stay: Payer: 59

## 2018-08-01 ENCOUNTER — Inpatient Hospital Stay: Payer: 59 | Attending: Genetic Counselor | Admitting: Genetic Counselor

## 2018-08-01 ENCOUNTER — Encounter: Payer: Self-pay | Admitting: Genetic Counselor

## 2018-08-01 DIAGNOSIS — Z860100 Personal history of colon polyps, unspecified: Secondary | ICD-10-CM | POA: Insufficient documentation

## 2018-08-01 DIAGNOSIS — Z800 Family history of malignant neoplasm of digestive organs: Secondary | ICD-10-CM

## 2018-08-01 DIAGNOSIS — Z1379 Encounter for other screening for genetic and chromosomal anomalies: Secondary | ICD-10-CM

## 2018-08-01 DIAGNOSIS — Z803 Family history of malignant neoplasm of breast: Secondary | ICD-10-CM | POA: Diagnosis not present

## 2018-08-01 NOTE — Progress Notes (Addendum)
REFERRING PROVIDER: Juanita Craver, MD [address] [place], [place] [address]  PRIMARY PROVIDER:  [name], MD  PRIMARY REASON FOR VISIT:  1. Family history of colon cancer   2. History of colon polyps   3. Family history of breast cancer      HISTORY OF PRESENT ILLNESS:   Erin Hall, a 57 y.o. female, was seen for a [place] cancer genetics consultation at the request of Dr. Collene Mares due to a personal history of colon polyps and family history of colon and breast cancer.  Ms. Prine presents to clinic today to discuss the possibility of a hereditary predisposition to cancer, genetic testing, and to further clarify her future cancer risks, as well as potential cancer risks for family members.   Erin Hall is a 57 y.o. female with no personal history of cancer.  She has a strong paternal family history of colon cancer.  The patient has a personal history of a goiter at age 42 and colon polyps.  She has had a colonoscopy every 2-5 years   CANCER HISTORY:   No history exists.     HORMONAL RISK FACTORS:  Menarche was at age 72-13.  First live birth at age N/A.  OCP use for approximately 19 years.  Ovaries intact: yes.  Hysterectomy: yes.  Menopausal status: postmenopausal.  HRT use: on the patch for a couple years years. Colonoscopy: yes; a few polpys on her first colonoscopy. Mammogram within the last year: yes. Number of breast biopsies: 0. Up to date with pelvic exams:  yes. Any excessive radiation exposure in the past:  no  Past Medical History:  Diagnosis Date  . Family history of breast cancer   . Family history of colon cancer   . History of cellulitis    DOG BITE RIGHT THIGH 2002  . History of colon polyps   . History of seasonal allergies   . History of uterine fibroid   . Hyperlipidemia    hx of  . Hypothyroidism   . Right knee meniscal tear     Past Surgical History:  Procedure Laterality Date  . BILATERAL SALPINGECTOMY N/A 12/12/2013    Procedure: BILATERAL SALPINGECTOMY;  Surgeon: Marvene Staff, MD;  Location: [facility];  Service: Gynecology;  Laterality: N/A;  . BREAST REDUCTION SURGERY Bilateral 02-08-2007  . COLONOSCOPY    . I & D RIGHT THIGH ABSCESS  12-04-2000  . KNEE ARTHROSCOPY Left several -- last one 2008  . KNEE ARTHROSCOPY Right 08/20/2015   Procedure: ARTHROSCOPY RIGHT KNEE/ MEDIAL AND LATERAL PARTIAL MENISCECTOMY/ MEDIAL AND LATERAL PARTIAL CHONDROPLASTY/ BILATERAL [place] INJECTION;  Surgeon: Paralee Cancel, MD;  Location: [facility];  Service: Orthopedics;  Laterality: Right;  AND BILATERAL HIPS  . KNEE SURGERY Left age 62   osteomalacia  . ROBOTIC ASSISTED TOTAL HYSTERECTOMY N/A 12/12/2013   Procedure: ROBOTIC ASSISTED TOTAL HYSTERECTOMY;  Surgeon: Marvene Staff, MD;  Location: [facility];  Service: Gynecology;  Laterality: N/A;  . SHOULDER ARTHROSCOPY WITH ROTATOR CUFF REPAIR AND SUBACROMIAL DECOMPRESSION Right 01/25/2018   Procedure: RIGHT SHOULDER ARTHROSCOPY WITH ROTATOR CUFF REPAIR AND SUBACROMIAL DECOMPRESSION, DISTAL CLAVICLE RESECTION;  Surgeon: Justice Britain, MD;  Location: [facility];  Service: Orthopedics;  Laterality: Right;  . TONSILLECTOMY    . UTERINE ARTERY EMBOLIZATION  04/17/2012  . WISDOM TOOTH EXTRACTION      Social History   Socioeconomic History  . Marital status: Single    Spouse name: Not on file  . Number of  children: Not on file  . Years of education: Not on file  . Highest education level: Not on file  Occupational History  . Not on file  Social Needs  . Financial resource strain: Not on file  . Food insecurity:    Worry: Not on file    Inability: Not on file  . Transportation needs:    Medical: Not on file    Non-medical: Not on file  Tobacco Use  . Smoking status: Never Smoker  . Smokeless tobacco: Never Used  Substance and Sexual Activity  . Alcohol use: No  . Drug use: No  . Sexual activity: Not Currently  Lifestyle  . Physical activity:    Days  per week: Not on file    Minutes per session: Not on file  . Stress: Not on file  Relationships  . Social connections:    Talks on phone: Not on file    Gets together: Not on file    Attends religious service: Not on file    Active member of club or organization: Not on file    Attends meetings of clubs or organizations: Not on file    Relationship status: Not on file  Other Topics Concern  . Not on file  Social History Narrative  . Not on file     FAMILY HISTORY:  We obtained a detailed, 4-generation family history.  Significant diagnoses are listed below: Family History  Problem Relation Age of Onset  . Breast cancer Mother 71  . Colon cancer Father        dx in his 66s, recurrance at age 32  . Colon cancer Paternal Grandfather        d. in his 32s  . Colon cancer Paternal Aunt        dx in 2s; d. in 49s  . Colon cancer Paternal Uncle        dx in 40s; d. in 80s  . Colon cancer Paternal Uncle        dx in 76s, d. in 41s  . Colon cancer Paternal Uncle        dx in 83s, d. in 77s  . Colon cancer Cousin        pat first cousin  . Skin cancer Sister        dx in her 61s  . Colon cancer Brother        dx in his 80s; pat 1/2 brother  . Heart attack Maternal Grandmother   . Heart failure Maternal Grandfather   . Colon cancer Cousin        pat first cousin  . Colon cancer Other        PGFs brother  . Colon cancer Cousin        father's multiple paternal first cousins    The patient does not have children.  She has two full sisters, a paternal half brother and maternal half brother.  Her paternal half brother developed colon cancer in his 5's and died in his 33's.  One sister had skin cancer in her 5's.  The patient's mother is living and her father is deceased.  The patient's father developed colon cancer in his 70's, had a recurrence and died in his 81's.  He had three brothers and four sisters.  One sister and all brothers had colon cancer in their 75's and died of  it later one.   The sister had 4-5 children, and two have had colon cancer.  One  brother had three children and one had colon cancer.  Several sisters did not have cancer, but one has a son who has colon cancer.  The paternal grandparents are deceased.  The grandfather had colon cancer and died in his 49's.  He had several siblings, one brother had colon cancer and has children and grandchildren with colon cancer.  The patient's mother had breast cancer at 2.  This was treated with a double mastectomy.  She has also had a hysterectomy, but is unsure whether the ovaries were removed.  She is one of 7-8 siblings, none of her siblings had cancer.  The maternal grandparents are deceased from non cancer related issues.  Ms. Hauth is unaware of previous family history of genetic testing for hereditary cancer risks. Patient's maternal ancestors are of African American descent, and paternal ancestors are of African American descent. There is no reported Ashkenazi Jewish ancestry. There is no known consanguinity.  GENETIC COUNSELING ASSESSMENT: Alyze Lauf is a 57 y.o. female with a personal history of colon polyps and family history of colon and breast cancer which is somewhat suggestive of a hereditary syndrome such as Lynch syndrome or breast/ovarian cancer syndrome and predisposition to cancer. We, therefore, discussed and recommended the following at today's visit.   DISCUSSION: We discussed that she has a strong family history of colon cancer.  About 5-7% of colon cancer is hereditary with most cases due to Lynch syndrome. We discussed that Lynch syndrome is comprised of a combination of cancers, most commonly colon, uterine, ovarian and other GI cancers.  Ms. Korf meets [place] Criteria for genetic testing based on multiple family members with colon cancer, with many first degree relatives.  Her mother had early onset breast cancer with no other family history.  We reviewed that while there is a small  chance that her mother could be a new BRCA mutation in her, the greater risk is for a hereditary moderate risk gene vs. Sporadic cancer in her mother. Moderate risk genes include ATM, CHEK2 and PALB2.     We reviewed the characteristics, features and inheritance patterns of hereditary cancer syndromes. We also discussed genetic testing, including the appropriate family members to test, the process of testing, insurance coverage and turn-around-time for results. We discussed the implications of a negative, positive and/or variant of uncertain significant result. We recommended Ms. Velis pursue genetic testing for the common hereditary gene panel. The Hereditary Gene Panel offered by [facility] includes sequencing and/or deletion duplication testing of the following 47 genes: APC, ATM, AXIN2, BARD1, BMPR1A, BRCA1, BRCA2, BRIP1, CDH1, CDK4, CDKN2A (p14ARF), CDKN2A (p16INK4a), CHEK2, CTNNA1, DICER1, EPCAM (Deletion/duplication testing only), GREM1 (promoter region deletion/duplication testing only), KIT, MEN1, MLH1, MSH2, MSH3, MSH6, MUTYH, NBN, NF1, NHTL1, PALB2, PDGFRA, PMS2, POLD1, POLE, PTEN, RAD50, RAD51C, RAD51D, SDHB, SDHC, SDHD, SMAD4, SMARCA4. STK11, TP53, TSC1, TSC2, and VHL.  The following genes were evaluated for sequence changes only: SDHA and HOXB13 c.251G>A variant only.   Based on Ms. Diveley's family history of cancer, she meets medical criteria for genetic testing. Despite that she meets criteria, she may still have an out of pocket cost. We discussed that if her out of pocket cost for testing is over $100, the laboratory will call and confirm whether she wants to proceed with testing.  If the out of pocket cost of testing is less than $100 she will be billed by the genetic testing laboratory.   In order to estimate her chance of having a Lynch syndrome mutation, we used  statistical models (PREMM1,2,6) and laboratory data that take into account her personal medical history, family history and  ancestry.  Because each model is different, there can be a lot of variability in the risks they give.  Therefore, these numbers must be considered a rough range and not a precise risk of having a Lynch syndrome mutation.  These models estimate that she has approximately a 5% chance of having a mutation. Based on this assessment of her family and personal history, genetic testing is recommended.   PLAN: After considering the risks, benefits, and limitations, Ms. Bohle  provided informed consent to pursue genetic testing and the blood sample was sent to [facility] for analysis of the common hereditary cancer panel. Results should be available within approximately 2-3 weeks' time, at which point they will be disclosed by telephone to Ms. Gellner, as will any additional recommendations warranted by these results. Ms. Printup will receive a summary of her genetic counseling visit and a copy of her results once available. This information will also be available in Epic. We encouraged Ms. Shough to remain in contact with cancer genetics annually so that we can continuously update the family history and inform her of any changes in cancer genetics and testing that may be of benefit for her family. Ms. Kingdon questions were answered to her satisfaction today. Our contact information was provided should additional questions or concerns arise.  Lastly, we encouraged Ms. Haggart to remain in contact with cancer genetics annually so that we can continuously update the family history and inform her of any changes in cancer genetics and testing that may be of benefit for this family.   Ms.  Wickliffe questions were answered to her satisfaction today. Our contact information was provided should additional questions or concerns arise. Thank you for the referral and allowing [place] to share in the care of your patient.   Shakiah Wester P. Florene Glen, [place], [facility] Certified Genetic Counselor [email or web address] phone:  [phone]  The patient was seen for a total of 55 minutes in face-to-face genetic counseling.  This patient was discussed with Drs. Magrinat, Lindi Adie and/or Burr Medico who agrees with the above.    _______________________________________________________________________ For Office Staff:  Number of people involved in session: 1 Was an Intern/ student involved with case: no

## 2018-08-02 MED FILL — ALPRAZolam 1 MG TABS: 1 | 30 days supply | Qty: 15 | Fill #0

## 2018-08-06 MED FILL — CELECOXIB 200 MG CAP: 200 | 30 days supply | Qty: 30 | Fill #1

## 2018-08-13 ENCOUNTER — Telehealth: Payer: Self-pay | Admitting: Genetic Counselor

## 2018-08-13 ENCOUNTER — Encounter: Payer: Self-pay | Admitting: Genetic Counselor

## 2018-08-13 DIAGNOSIS — Z1379 Encounter for other screening for genetic and chromosomal anomalies: Secondary | ICD-10-CM | POA: Insufficient documentation

## 2018-08-13 NOTE — Telephone Encounter (Signed)
LM on VM that results are back and to please CB. 

## 2018-08-16 ENCOUNTER — Ambulatory Visit: Payer: Self-pay | Admitting: Genetic Counselor

## 2018-08-16 DIAGNOSIS — Z1379 Encounter for other screening for genetic and chromosomal anomalies: Secondary | ICD-10-CM

## 2018-08-16 DIAGNOSIS — Z800 Family history of malignant neoplasm of digestive organs: Secondary | ICD-10-CM

## 2018-08-16 NOTE — Progress Notes (Signed)
HPI:  Ms. Erin Hall was previously seen in the [facility] clinic due to a family history of cancer and concerns regarding a hereditary predisposition to cancer. Please refer to our prior cancer genetics clinic note for more information regarding Ms. Erin Hall's medical, social and family histories, and our assessment and recommendations, at the time. Ms. Erin Hall recent genetic test results were disclosed to her, as were recommendations warranted by these results. These results and recommendations are discussed in more detail below.  CANCER HISTORY:   No history exists.    FAMILY HISTORY:  We obtained a detailed, 4-generation family history.  Significant diagnoses are listed below: Family History  Problem Relation Age of Onset  . Breast cancer Mother 60  . Colon cancer Father        dx in his 35s, recurrance at age 4  . Colon cancer Paternal Grandfather        d. in his 61s  . Colon cancer Paternal Aunt        dx in 55s; d. in 23s  . Colon cancer Paternal Uncle        dx in 73s; d. in 70s  . Colon cancer Paternal Uncle        dx in 63s, d. in 60s  . Colon cancer Paternal Uncle        dx in 40s, d. in 67s  . Colon cancer Cousin        pat first cousin  . Skin cancer Sister        dx in her 80s  . Colon cancer Brother        dx in his 75s; pat 1/2 brother  . Heart attack Maternal Grandmother   . Heart failure Maternal Grandfather   . Colon cancer Cousin        pat first cousin  . Colon cancer Other        PGFs brother  . Colon cancer Cousin        father's multiple paternal first cousins    The patient does not have children.  She has two full sisters, a paternal half brother and maternal half brother.  Her paternal half brother developed colon cancer in his 37's and died in his 65's.  One sister had skin cancer in her 15's.  The patient's mother is living and her father is deceased.  The patient's father developed colon cancer in his 41's, had a recurrence and died  in his 49's.  He had three brothers and four sisters.  One sister and all brothers had colon cancer in their 85's and died of it later one.   The sister had 4-5 children, and two have had colon cancer.  One brother had three children and one had colon cancer.  Several sisters did not have cancer, but one has a son who has colon cancer.  The paternal grandparents are deceased.  The grandfather had colon cancer and died in his 37's.  He had several siblings, one brother had colon cancer and has children and grandchildren with colon cancer.  The patient's mother had breast cancer at 73.  This was treated with a double mastectomy.  She has also had a hysterectomy, but is unsure whether the ovaries were removed.  She is one of 7-8 siblings, none of her siblings had cancer.  The maternal grandparents are deceased from non cancer related issues.  Ms. Erin Hall is unaware of previous family history of genetic testing for hereditary cancer risks. Patient's  maternal ancestors are of [place] American descent, and paternal ancestors are of African American descent. There is no reported Ashkenazi Jewish ancestry. There is no known consanguinity.  GENETIC TEST RESULTS: Genetic testing reported out on August 10, 2018 through the common hereditary cancer panel found no deleterious mutations.  The Hereditary Gene Panel offered by [facility] includes sequencing and/or deletion duplication testing of the following 47 genes: APC, ATM, AXIN2, BARD1, BMPR1A, BRCA1, BRCA2, BRIP1, CDH1, CDK4, CDKN2A (p14ARF), CDKN2A (p16INK4a), CHEK2, CTNNA1, DICER1, EPCAM (Deletion/duplication testing only), GREM1 (promoter region deletion/duplication testing only), KIT, MEN1, MLH1, MSH2, MSH3, MSH6, MUTYH, NBN, NF1, NHTL1, PALB2, PDGFRA, PMS2, POLD1, POLE, PTEN, RAD50, RAD51C, RAD51D, SDHB, SDHC, SDHD, SMAD4, SMARCA4. STK11, TP53, TSC1, TSC2, and VHL.  The following genes were evaluated for sequence changes only: SDHA and HOXB13 c.251G>A variant only.   The test report has been scanned into EPIC and is located under the Molecular Pathology section of the Results Review tab.    We discussed with Ms. Erin Hall that since the current genetic testing is not perfect, it is possible there may be a gene mutation in one of these genes that current testing cannot detect, but that chance is small.  We also discussed, that it is possible that another gene that has not yet been discovered, or that we have not yet tested, is responsible for the cancer diagnoses in the family, and it is, therefore, important to remain in touch with cancer genetics in the future so that we can continue to offer Ms. Erin Hall the most up to date genetic testing.   Genetic testing did detect a Variant of Unknown Significance in the MSH3 gene called c.421T>C. At this time, it is unknown if this variant is associated with increased cancer risk or if this is a normal finding, but most variants such as this get reclassified to being inconsequential. It should not be used to make medical management decisions. With time, we suspect the lab will determine the significance of this variant, if any. If we do learn more about it, we will try to contact Ms. Erin Hall to discuss it further. However, it is important to stay in touch with [place] periodically and keep the address and phone number up to date.    CANCER SCREENING RECOMMENDATIONS:  Given Ms. Erin Hall's personal and family histories, we must interpret these negative results with some caution.  Families with features suggestive of hereditary risk for cancer tend to have multiple family members with cancer, diagnoses in multiple generations and diagnoses before the age of 69. Ms. Erin Hall family exhibits some of these features. Thus this result may simply reflect our current inability to detect all mutations within these genes or there may be a different gene that has not yet been discovered or tested.   An individual's cancer risk and medical management are not  determined by genetic test results alone. Overall cancer risk assessment incorporates additional factors, including personal medical history, family history, and any available genetic information that may result in a personalized plan for cancer prevention and surveillance.  We strongly suggest that Ms. Erin Hall family undergo genetic testing, more specifically someone in the family who has colon cancer.  Her family history is strong enough to follow her as if she ha Lynch syndrome.  We discussed that if a family member has tested positive for a hereditary syndrome, such as Lynch syndrome, and she is negative for that condition, then we may be able to change her medical management.  RECOMMENDATIONS FOR FAMILY MEMBERS:  Individuals in this family might be at some increased risk of developing cancer, over the general population risk, simply due to the family history of cancer.  We recommended women in this family have a yearly mammogram beginning at age 44, or 50 years younger than the earliest onset of cancer, an annual clinical breast exam, and perform monthly breast self-exams. Women in this family should also have a gynecological exam as recommended by their primary provider. All family members should have a colonoscopy by age 43.  Based on Ms. Erin Hall family history, we recommended her sisters who have not been diagnosed with cancer, as well as her cousins, who were diagnosed with colon cancer, have genetic counseling and testing. Ms. Erin Hall will let us know if we can be of any assistance in coordinating genetic counseling and/or testing for this family member.   FOLLOW-UP: Lastly, we discussed with Ms. Erin Hall that cancer genetics is a rapidly advancing field and it is possible that new genetic tests will be appropriate for her and/or her family members in the future. We encouraged her to remain in contact with cancer genetics on an annual basis so we can update her personal and family histories and let her  know of advances in cancer genetics that may benefit this family.   Our contact number was provided. Ms. Erin Hall questions were answered to her satisfaction, and she knows she is welcome to call us at anytime with additional questions or concerns.   Roma Kayser, MS, [facility] Certified Genetic Counselor [email or web address]

## 2018-08-16 NOTE — Telephone Encounter (Signed)
Revealed that her genetic testing was normal.  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  Discussed that it would be important to know if there are other family members who have had genetic testing.  This testing will not change her medical management b/c at this point we cannot confirm if this is a true negative test result or not.  I recommended that her sisters undergo genetic testing as well, in case there is a hereditary condition running in the family and she just has not inherited it.

## 2018-08-16 NOTE — Telephone Encounter (Signed)
LM on VM that I have her results and to please call back.

## 2018-08-17 DIAGNOSIS — H52203 Unspecified astigmatism, bilateral: Secondary | ICD-10-CM | POA: Diagnosis not present

## 2018-08-30 MED FILL — CELECOXIB 200 MG CAP: 200 | 30 days supply | Qty: 30 | Fill #2

## 2018-08-30 MED FILL — ESTRADIOL 0.1 MG/24HR PTTW: 0.1 | 28 days supply | Qty: 8 | Fill #0

## 2018-09-11 MED FILL — ALPRAZolam 1 MG TABS: 1 | 30 days supply | Qty: 15 | Fill #1

## 2018-09-18 DIAGNOSIS — H1033 Unspecified acute conjunctivitis, bilateral: Secondary | ICD-10-CM | POA: Diagnosis not present

## 2018-10-10 MED FILL — CELECOXIB 200 MG CAP: 200 | 30 days supply | Qty: 30 | Fill #0

## 2018-10-11 MED FILL — ALPRAZolam 1 MG TABS: 1 | 30 days supply | Qty: 15 | Fill #2

## 2018-10-16 MED FILL — LEVOTHYROXINE 50 MCG TABLET: 50 | 90 days supply | Qty: 90 | Fill #1

## 2018-10-16 MED FILL — ESTRADIOL 0.1 MG/24HR PTTW: 0.1 | 28 days supply | Qty: 8 | Fill #1 | Status: TO

## 2018-10-18 DIAGNOSIS — M1711 Unilateral primary osteoarthritis, right knee: Secondary | ICD-10-CM | POA: Diagnosis not present

## 2018-10-18 DIAGNOSIS — M1712 Unilateral primary osteoarthritis, left knee: Secondary | ICD-10-CM | POA: Diagnosis not present

## 2018-10-18 DIAGNOSIS — M25561 Pain in right knee: Secondary | ICD-10-CM | POA: Diagnosis not present

## 2018-10-23 MED FILL — OSELTAMIVIR PHOSPHATE 75 MG: 75 | 5 days supply | Qty: 10 | Fill #0

## 2018-11-05 MED FILL — CELECOXIB 200 MG CAP: 200 | 30 days supply | Qty: 30 | Fill #1 | Status: TO

## 2018-11-27 MED FILL — CELECOXIB 200 MG CAPSULE: 200 | 30 days supply | Qty: 30 | Fill #0

## 2018-11-27 MED FILL — ESTRADIOL 0.1 MG/24HR PTTW: 0.1 | 28 days supply | Qty: 8 | Fill #0

## 2018-11-29 MED FILL — ALPRAZolam 1 MG TABS: 1 | 30 days supply | Qty: 15 | Fill #0

## 2019-01-01 MED FILL — ALPRAZolam 1 MG TABS: 1 | 30 days supply | Qty: 15 | Fill #1 | Status: TO

## 2019-01-01 MED FILL — CELECOXIB 200 MG CAPSULE: 200 | 30 days supply | Qty: 30 | Fill #0 | Status: TO

## 2019-01-01 MED FILL — ESTRADIOL 0.1 MG/24HR PTTW: 0.1 | 28 days supply | Qty: 8 | Fill #1 | Status: TO

## 2019-01-28 MED FILL — CELECOXIB 200 MG CAP: 200 | 30 days supply | Qty: 30 | Fill #0

## 2019-01-28 MED FILL — ESTRADIOL 0.1 MG/24HR PTTW: 0.1 | 28 days supply | Qty: 8 | Fill #0

## 2019-01-28 MED FILL — LEVOTHYROXINE 50 MCG TABLET: 50 | 90 days supply | Qty: 90 | Fill #2

## 2019-01-29 MED FILL — ALPRAZolam 1 MG TABS: 1 | 30 days supply | Qty: 15 | Fill #0

## 2019-02-07 DIAGNOSIS — M170 Bilateral primary osteoarthritis of knee: Secondary | ICD-10-CM | POA: Diagnosis not present

## 2019-02-14 DIAGNOSIS — M170 Bilateral primary osteoarthritis of knee: Secondary | ICD-10-CM | POA: Diagnosis not present

## 2019-02-21 DIAGNOSIS — M170 Bilateral primary osteoarthritis of knee: Secondary | ICD-10-CM | POA: Diagnosis not present

## 2019-03-01 DIAGNOSIS — Z800 Family history of malignant neoplasm of digestive organs: Secondary | ICD-10-CM | POA: Diagnosis not present

## 2019-03-01 DIAGNOSIS — M1710 Unilateral primary osteoarthritis, unspecified knee: Secondary | ICD-10-CM | POA: Diagnosis not present

## 2019-03-01 DIAGNOSIS — E042 Nontoxic multinodular goiter: Secondary | ICD-10-CM | POA: Diagnosis not present

## 2019-03-01 DIAGNOSIS — Z79899 Other long term (current) drug therapy: Secondary | ICD-10-CM | POA: Diagnosis not present

## 2019-03-01 DIAGNOSIS — G4700 Insomnia, unspecified: Secondary | ICD-10-CM | POA: Diagnosis not present

## 2019-03-01 DIAGNOSIS — E7800 Pure hypercholesterolemia, unspecified: Secondary | ICD-10-CM | POA: Diagnosis not present

## 2019-03-04 DIAGNOSIS — Z79899 Other long term (current) drug therapy: Secondary | ICD-10-CM | POA: Diagnosis not present

## 2019-03-04 DIAGNOSIS — E7800 Pure hypercholesterolemia, unspecified: Secondary | ICD-10-CM | POA: Diagnosis not present

## 2019-03-04 MED FILL — CELECOXIB 200 MG CAP: 200 | 30 days supply | Qty: 30 | Fill #1

## 2019-03-04 MED FILL — ESTRADIOL 0.1 MG/24HR PTTW: 0.1 | 28 days supply | Qty: 8 | Fill #1

## 2019-03-05 MED FILL — ALPRAZolam 1 MG TABS: 1 | 30 days supply | Qty: 15 | Fill #0

## 2019-04-08 MED FILL — ALPRAZolam 1 MG TABS: 1 | 30 days supply | Qty: 15 | Fill #1

## 2019-04-08 MED FILL — ESTRADIOL 0.1 MG/24HR PTTW: 0.1 | 28 days supply | Qty: 8 | Fill #2

## 2019-04-08 MED FILL — CELECOXIB 200 MG CAP: 200 | 30 days supply | Qty: 30 | Fill #0

## 2019-05-03 DIAGNOSIS — E049 Nontoxic goiter, unspecified: Secondary | ICD-10-CM | POA: Diagnosis not present

## 2019-05-03 DIAGNOSIS — R7301 Impaired fasting glucose: Secondary | ICD-10-CM | POA: Diagnosis not present

## 2019-05-03 DIAGNOSIS — E039 Hypothyroidism, unspecified: Secondary | ICD-10-CM | POA: Diagnosis not present

## 2019-05-03 DIAGNOSIS — E663 Overweight: Secondary | ICD-10-CM | POA: Diagnosis not present

## 2019-05-03 DIAGNOSIS — E7800 Pure hypercholesterolemia, unspecified: Secondary | ICD-10-CM | POA: Diagnosis not present

## 2019-05-08 ENCOUNTER — Other Ambulatory Visit (HOSPITAL_COMMUNITY): Payer: Self-pay | Admitting: Endocrinology

## 2019-05-08 ENCOUNTER — Other Ambulatory Visit: Payer: Self-pay | Admitting: Endocrinology

## 2019-05-08 DIAGNOSIS — E049 Nontoxic goiter, unspecified: Secondary | ICD-10-CM

## 2019-05-10 MED FILL — CELECOXIB 200 MG CAP: 200 | 30 days supply | Qty: 30 | Fill #1

## 2019-05-10 MED FILL — LEVOTHYROXINE 50 MCG TABLET: 50 | 90 days supply | Qty: 90 | Fill #3

## 2019-05-10 MED FILL — ALPRAZolam 1 MG TABS: 1 | 30 days supply | Qty: 15 | Fill #2

## 2019-05-10 MED FILL — ESTRADIOL 0.1 MG/24HR PTTW: 0.1 | 28 days supply | Qty: 8 | Fill #3

## 2019-05-21 ENCOUNTER — Ambulatory Visit (HOSPITAL_COMMUNITY): Admission: RE | Admit: 2019-05-21 | Payer: 59 | Source: Ambulatory Visit

## 2019-05-21 ENCOUNTER — Encounter (HOSPITAL_COMMUNITY): Payer: Self-pay

## 2019-06-11 MED FILL — CELECOXIB 200 MG CAP: 200 | 30 days supply | Qty: 30 | Fill #0

## 2019-06-13 MED FILL — ALPRAZolam 1 MG TABS: 1 | 30 days supply | Qty: 15 | Fill #0

## 2019-07-02 MED FILL — ESTRADIOL 0.1 MG/24HR PTTW: 0.1 | 28 days supply | Qty: 8 | Fill #4

## 2019-07-17 MED FILL — ALPRAZolam 1 MG TABS: 1 | 30 days supply | Qty: 15 | Fill #1

## 2019-07-17 MED FILL — CELECOXIB 200 MG CAP: 200 | 30 days supply | Qty: 30 | Fill #1

## 2019-08-06 MED FILL — ESTRADIOL 0.1 MG/24HR PTTW: 0.1 | 28 days supply | Qty: 8 | Fill #5

## 2019-08-13 MED FILL — CELECOXIB 200 MG CAP: 200 | 30 days supply | Qty: 30 | Fill #0

## 2019-08-14 MED FILL — ALPRAZolam 1 MG TABS: 1 | 30 days supply | Qty: 15 | Fill #2

## 2019-09-04 DIAGNOSIS — Z6827 Body mass index (BMI) 27.0-27.9, adult: Secondary | ICD-10-CM | POA: Diagnosis not present

## 2019-09-04 DIAGNOSIS — Z01419 Encounter for gynecological examination (general) (routine) without abnormal findings: Secondary | ICD-10-CM | POA: Diagnosis not present

## 2019-09-10 MED FILL — LEVOTHYROXINE 50 MCG TABLET: 50 | 90 days supply | Qty: 90 | Fill #0

## 2019-09-11 MED FILL — ESTRADIOL 0.1 MG/24HR PTTW: 0.1 | 28 days supply | Qty: 8 | Fill #0

## 2019-09-12 MED FILL — CELECOXIB 200 MG CAP: 200 | 30 days supply | Qty: 30 | Fill #1

## 2019-09-13 MED FILL — ALPRAZolam 1 MG TABS: 1 | 30 days supply | Qty: 15 | Fill #0

## 2019-09-19 MED FILL — LEVOTHYROXINE 50 MCG TABLET: 50 | 90 days supply | Qty: 90 | Fill #0

## 2019-10-04 DIAGNOSIS — Z0000 Encounter for general adult medical examination without abnormal findings: Secondary | ICD-10-CM | POA: Diagnosis not present

## 2019-10-04 DIAGNOSIS — E042 Nontoxic multinodular goiter: Secondary | ICD-10-CM | POA: Diagnosis not present

## 2019-10-04 DIAGNOSIS — E7800 Pure hypercholesterolemia, unspecified: Secondary | ICD-10-CM | POA: Diagnosis not present

## 2019-10-04 DIAGNOSIS — Z800 Family history of malignant neoplasm of digestive organs: Secondary | ICD-10-CM | POA: Diagnosis not present

## 2019-10-04 DIAGNOSIS — M1710 Unilateral primary osteoarthritis, unspecified knee: Secondary | ICD-10-CM | POA: Diagnosis not present

## 2019-10-04 DIAGNOSIS — F5104 Psychophysiologic insomnia: Secondary | ICD-10-CM | POA: Diagnosis not present

## 2019-10-17 MED FILL — ESTRADIOL 0.1 MG/24HR PTTW: 0.1 | 28 days supply | Qty: 8 | Fill #1

## 2019-10-17 MED FILL — CELECOXIB 200 MG CAP: 200 | 30 days supply | Qty: 30 | Fill #0

## 2019-11-21 MED FILL — CELECOXIB 200 MG CAP: 200 | 30 days supply | Qty: 30 | Fill #1

## 2019-11-21 MED FILL — ESTRADIOL 0.1 MG/24HR PTTW: 0.1 | 28 days supply | Qty: 8 | Fill #2

## 2019-12-13 MED FILL — LEVOTHYROXINE 50 MCG TABLET: 50 | 90 days supply | Qty: 90 | Fill #1

## 2019-12-13 MED FILL — ALPRAZolam 1 MG TABS: 1 | 30 days supply | Qty: 15 | Fill #0

## 2019-12-23 MED FILL — CELECOXIB 200 MG CAP: 200 | 30 days supply | Qty: 30 | Fill #2

## 2020-01-13 MED FILL — ESTRADIOL 0.1 MG/24HR PTTW: 0.1 | 84 days supply | Qty: 24 | Fill #3

## 2020-01-23 MED FILL — CELECOXIB 200 MG CAP: 200 | 30 days supply | Qty: 30 | Fill #0

## 2020-01-23 MED FILL — ALPRAZolam 1 MG TABS: 1 | 30 days supply | Qty: 15 | Fill #0

## 2020-02-21 MED FILL — CELECOXIB 200 MG CAP: 200 | 30 days supply | Qty: 30 | Fill #1

## 2020-03-27 MED FILL — CELECOXIB 200 MG CAP: 200 | 30 days supply | Qty: 30 | Fill #2

## 2020-04-01 MED FILL — ALPRAZolam 1 MG TABS: 1 | 30 days supply | Qty: 15 | Fill #0

## 2020-04-14 MED FILL — ALPRAZolam 1 MG TABS: 1 | 30 days supply | Qty: 15 | Fill #0

## 2020-04-14 MED FILL — LEVOTHYROXINE 50 MCG TABLET: 50 | 90 days supply | Qty: 90 | Fill #2

## 2020-04-21 MED FILL — ESTRADIOL 0.1 MG/24HR PTTW: 0.1 | 84 days supply | Qty: 24 | Fill #4

## 2020-04-30 ENCOUNTER — Other Ambulatory Visit (HOSPITAL_COMMUNITY): Payer: Self-pay | Admitting: Orthopedic Surgery

## 2020-04-30 MED FILL — ESTRADIOL 0.1 MG/24HR PTTW: 0.1 | 84 days supply | Qty: 24 | Fill #4

## 2020-04-30 MED FILL — CELECOXIB 200 MG CAP: 200 | 30 days supply | Qty: 30 | Fill #0

## 2020-05-29 MED FILL — CELECOXIB 200 MG CAP: 200 | 30 days supply | Qty: 30 | Fill #1

## 2020-06-02 DIAGNOSIS — Z1211 Encounter for screening for malignant neoplasm of colon: Secondary | ICD-10-CM | POA: Diagnosis not present

## 2020-06-02 DIAGNOSIS — Z800 Family history of malignant neoplasm of digestive organs: Secondary | ICD-10-CM | POA: Diagnosis not present

## 2020-06-02 DIAGNOSIS — Z86010 Personal history of colonic polyps: Secondary | ICD-10-CM | POA: Diagnosis not present

## 2020-06-03 ENCOUNTER — Other Ambulatory Visit (HOSPITAL_COMMUNITY): Payer: Self-pay | Admitting: Gastroenterology

## 2020-06-03 MED FILL — CLENPIQ 10-3.5-12 MG-GM -GM: 10-3.5-12 M | 2 days supply | Qty: 320 | Fill #0

## 2020-06-29 DIAGNOSIS — E663 Overweight: Secondary | ICD-10-CM | POA: Diagnosis not present

## 2020-06-29 DIAGNOSIS — E7800 Pure hypercholesterolemia, unspecified: Secondary | ICD-10-CM | POA: Diagnosis not present

## 2020-06-29 DIAGNOSIS — R7301 Impaired fasting glucose: Secondary | ICD-10-CM | POA: Diagnosis not present

## 2020-06-29 DIAGNOSIS — E039 Hypothyroidism, unspecified: Secondary | ICD-10-CM | POA: Diagnosis not present

## 2020-06-29 DIAGNOSIS — E049 Nontoxic goiter, unspecified: Secondary | ICD-10-CM | POA: Diagnosis not present

## 2020-07-01 MED FILL — CELECOXIB 200 MG CAP: 200 | 30 days supply | Qty: 30 | Fill #2

## 2020-07-01 MED FILL — CLENPIQ 10-3.5-12 MG-GM -GM: 10-3.5-12 M | 2 days supply | Qty: 320 | Fill #0

## 2020-07-02 ENCOUNTER — Other Ambulatory Visit: Payer: Self-pay | Admitting: Endocrinology

## 2020-07-02 DIAGNOSIS — E049 Nontoxic goiter, unspecified: Secondary | ICD-10-CM

## 2020-07-06 DIAGNOSIS — Z1211 Encounter for screening for malignant neoplasm of colon: Secondary | ICD-10-CM | POA: Diagnosis not present

## 2020-07-06 DIAGNOSIS — Z800 Family history of malignant neoplasm of digestive organs: Secondary | ICD-10-CM | POA: Diagnosis not present

## 2020-07-24 ENCOUNTER — Other Ambulatory Visit (HOSPITAL_COMMUNITY): Payer: Self-pay | Admitting: Family Medicine

## 2020-07-24 MED FILL — ALPRAZolam 1 MG TABS: 1 | 30 days supply | Qty: 15 | Fill #0

## 2020-08-04 ENCOUNTER — Other Ambulatory Visit (HOSPITAL_COMMUNITY): Payer: Self-pay | Admitting: Orthopedic Surgery

## 2020-08-04 MED FILL — CELECOXIB 200 MG CAP: 200 | 30 days supply | Qty: 30 | Fill #0

## 2020-08-04 MED FILL — ALPRAZolam 1 MG TABS: 1 | 30 days supply | Qty: 15 | Fill #0

## 2020-08-04 MED FILL — ESTRADIOL 0.1 MG/24HR PTTW: 0.1 | 84 days supply | Qty: 24 | Fill #5

## 2020-08-04 MED FILL — LEVOTHYROXINE 50 MCG TABLET: 50 | 90 days supply | Qty: 90 | Fill #3

## 2020-08-07 DIAGNOSIS — Z111 Encounter for screening for respiratory tuberculosis: Secondary | ICD-10-CM | POA: Diagnosis not present

## 2020-08-19 ENCOUNTER — Other Ambulatory Visit (HOSPITAL_COMMUNITY): Payer: Self-pay | Admitting: Family Medicine

## 2020-08-19 DIAGNOSIS — F5104 Psychophysiologic insomnia: Secondary | ICD-10-CM | POA: Diagnosis not present

## 2020-08-19 DIAGNOSIS — S61210A Laceration without foreign body of right index finger without damage to nail, initial encounter: Secondary | ICD-10-CM | POA: Diagnosis not present

## 2020-08-19 MED FILL — CEPHALEXIN 500 MG CAPSULE: 500 | 10 days supply | Qty: 40 | Fill #0

## 2020-08-26 DIAGNOSIS — M79644 Pain in right finger(s): Secondary | ICD-10-CM | POA: Diagnosis not present

## 2020-08-26 DIAGNOSIS — S61220A Laceration with foreign body of right index finger without damage to nail, initial encounter: Secondary | ICD-10-CM | POA: Diagnosis not present

## 2020-09-04 ENCOUNTER — Other Ambulatory Visit (HOSPITAL_COMMUNITY): Payer: Self-pay | Admitting: Family Medicine

## 2020-09-04 MED FILL — ALPRAZolam 1 MG TABS: 1 | 30 days supply | Qty: 15 | Fill #0

## 2020-09-04 MED FILL — CELECOXIB 200 MG CAP: 200 | 30 days supply | Qty: 30 | Fill #1

## 2020-09-17 ENCOUNTER — Other Ambulatory Visit (HOSPITAL_COMMUNITY): Payer: Self-pay | Admitting: Orthopedic Surgery

## 2020-09-17 DIAGNOSIS — M542 Cervicalgia: Secondary | ICD-10-CM | POA: Diagnosis not present

## 2020-09-17 DIAGNOSIS — M25512 Pain in left shoulder: Secondary | ICD-10-CM | POA: Diagnosis not present

## 2020-09-17 MED FILL — predniSONE 5 MG TABS: 5 | 6 days supply | Qty: 21 | Fill #0

## 2020-10-01 DIAGNOSIS — Z90710 Acquired absence of both cervix and uterus: Secondary | ICD-10-CM | POA: Diagnosis not present

## 2020-10-01 DIAGNOSIS — Z780 Asymptomatic menopausal state: Secondary | ICD-10-CM | POA: Diagnosis not present

## 2020-10-01 DIAGNOSIS — Z01419 Encounter for gynecological examination (general) (routine) without abnormal findings: Secondary | ICD-10-CM | POA: Diagnosis not present

## 2020-10-04 ENCOUNTER — Other Ambulatory Visit (HOSPITAL_COMMUNITY): Payer: Self-pay | Admitting: Obstetrics and Gynecology

## 2020-10-06 MED FILL — ALPRAZolam 1 MG TABS: 1 | 30 days supply | Qty: 15 | Fill #1

## 2020-10-06 MED FILL — CELECOXIB 200 MG CAP: 200 | 30 days supply | Qty: 30 | Fill #2

## 2020-10-06 MED FILL — predniSONE 5 MG TABS: 5 | 6 days supply | Qty: 21 | Fill #0

## 2020-10-12 MED FILL — ESTRADIOL 0.1 MG/24HR PTTW: 0.1 | 24 days supply | Qty: 24 | Fill #0

## 2020-10-13 ENCOUNTER — Other Ambulatory Visit (HOSPITAL_COMMUNITY): Payer: Self-pay | Admitting: Family Medicine

## 2020-10-13 DIAGNOSIS — Z03818 Encounter for observation for suspected exposure to other biological agents ruled out: Secondary | ICD-10-CM | POA: Diagnosis not present

## 2020-10-13 DIAGNOSIS — R0981 Nasal congestion: Secondary | ICD-10-CM | POA: Diagnosis not present

## 2020-10-13 MED FILL — AMOXICILLIN 875 MG TABS: 875 | 7 days supply | Qty: 14 | Fill #0

## 2020-10-19 DIAGNOSIS — Z1231 Encounter for screening mammogram for malignant neoplasm of breast: Secondary | ICD-10-CM | POA: Diagnosis not present

## 2020-11-09 ENCOUNTER — Other Ambulatory Visit (HOSPITAL_COMMUNITY): Payer: Self-pay | Admitting: Orthopedic Surgery

## 2020-11-09 MED FILL — ALPRAZolam 1 MG TABS: 1 | 30 days supply | Qty: 15 | Fill #2

## 2020-11-09 MED FILL — CELECOXIB 200 MG CAP: 200 | 30 days supply | Qty: 30 | Fill #0

## 2020-11-17 ENCOUNTER — Other Ambulatory Visit (HOSPITAL_COMMUNITY): Payer: Self-pay | Admitting: Family Medicine

## 2020-11-17 DIAGNOSIS — F5104 Psychophysiologic insomnia: Secondary | ICD-10-CM | POA: Diagnosis not present

## 2020-11-17 DIAGNOSIS — G5622 Lesion of ulnar nerve, left upper limb: Secondary | ICD-10-CM | POA: Diagnosis not present

## 2020-12-04 ENCOUNTER — Other Ambulatory Visit (HOSPITAL_COMMUNITY): Payer: Self-pay

## 2020-12-07 ENCOUNTER — Other Ambulatory Visit (HOSPITAL_COMMUNITY): Payer: Self-pay

## 2020-12-07 MED ORDER — LEVOTHYROXINE SODIUM 50 MCG PO TABS
50.0000 ug | ORAL_TABLET | ORAL | 4 refills | Status: DC
  Filled 2020-12-07: qty 90, 90d supply, fill #0
  Filled 2021-03-03: qty 90, 90d supply, fill #1
  Filled 2021-06-16: qty 90, 90d supply, fill #2
  Filled 2021-09-27: qty 90, 90d supply, fill #3

## 2020-12-09 ENCOUNTER — Other Ambulatory Visit (HOSPITAL_COMMUNITY): Payer: Self-pay

## 2020-12-09 MED FILL — Trazodone HCl Tab 50 MG: ORAL | 30 days supply | Qty: 30 | Fill #0 | Status: AC

## 2020-12-10 ENCOUNTER — Other Ambulatory Visit (HOSPITAL_COMMUNITY): Payer: Self-pay

## 2020-12-11 ENCOUNTER — Other Ambulatory Visit (HOSPITAL_COMMUNITY): Payer: Self-pay

## 2020-12-11 MED FILL — Alprazolam Tab 1 MG: ORAL | 30 days supply | Qty: 15 | Fill #0 | Status: AC

## 2020-12-16 ENCOUNTER — Other Ambulatory Visit (HOSPITAL_COMMUNITY): Payer: Self-pay

## 2020-12-16 MED FILL — Celecoxib Cap 200 MG: ORAL | 30 days supply | Qty: 30 | Fill #0 | Status: AC

## 2020-12-23 DIAGNOSIS — M25542 Pain in joints of left hand: Secondary | ICD-10-CM | POA: Diagnosis not present

## 2020-12-23 DIAGNOSIS — M9902 Segmental and somatic dysfunction of thoracic region: Secondary | ICD-10-CM | POA: Diagnosis not present

## 2020-12-23 DIAGNOSIS — M21769 Unequal limb length (acquired), unspecified tibia and fibula: Secondary | ICD-10-CM | POA: Diagnosis not present

## 2020-12-23 DIAGNOSIS — M9903 Segmental and somatic dysfunction of lumbar region: Secondary | ICD-10-CM | POA: Diagnosis not present

## 2020-12-23 DIAGNOSIS — M9901 Segmental and somatic dysfunction of cervical region: Secondary | ICD-10-CM | POA: Diagnosis not present

## 2020-12-23 DIAGNOSIS — M25522 Pain in left elbow: Secondary | ICD-10-CM | POA: Diagnosis not present

## 2020-12-23 DIAGNOSIS — M25512 Pain in left shoulder: Secondary | ICD-10-CM | POA: Diagnosis not present

## 2020-12-23 DIAGNOSIS — M9906 Segmental and somatic dysfunction of lower extremity: Secondary | ICD-10-CM | POA: Diagnosis not present

## 2020-12-23 DIAGNOSIS — M9904 Segmental and somatic dysfunction of sacral region: Secondary | ICD-10-CM | POA: Diagnosis not present

## 2020-12-30 DIAGNOSIS — M9902 Segmental and somatic dysfunction of thoracic region: Secondary | ICD-10-CM | POA: Diagnosis not present

## 2020-12-30 DIAGNOSIS — M9907 Segmental and somatic dysfunction of upper extremity: Secondary | ICD-10-CM | POA: Diagnosis not present

## 2020-12-30 DIAGNOSIS — M25542 Pain in joints of left hand: Secondary | ICD-10-CM | POA: Diagnosis not present

## 2020-12-30 DIAGNOSIS — M25522 Pain in left elbow: Secondary | ICD-10-CM | POA: Diagnosis not present

## 2020-12-30 DIAGNOSIS — M9901 Segmental and somatic dysfunction of cervical region: Secondary | ICD-10-CM | POA: Diagnosis not present

## 2020-12-30 DIAGNOSIS — M25512 Pain in left shoulder: Secondary | ICD-10-CM | POA: Diagnosis not present

## 2021-01-04 ENCOUNTER — Other Ambulatory Visit (HOSPITAL_BASED_OUTPATIENT_CLINIC_OR_DEPARTMENT_OTHER): Payer: Self-pay

## 2021-01-04 ENCOUNTER — Ambulatory Visit: Payer: 59

## 2021-01-05 ENCOUNTER — Ambulatory Visit: Payer: 59 | Attending: Internal Medicine

## 2021-01-05 ENCOUNTER — Other Ambulatory Visit (HOSPITAL_BASED_OUTPATIENT_CLINIC_OR_DEPARTMENT_OTHER): Payer: Self-pay

## 2021-01-05 ENCOUNTER — Other Ambulatory Visit: Payer: Self-pay

## 2021-01-05 DIAGNOSIS — Z23 Encounter for immunization: Secondary | ICD-10-CM

## 2021-01-05 MED ORDER — PFIZER-BIONT COVID-19 VAC-TRIS 30 MCG/0.3ML IM SUSP
INTRAMUSCULAR | 0 refills | Status: DC
  Filled 2021-01-05: qty 0.3, 1d supply, fill #0

## 2021-01-05 NOTE — Progress Notes (Signed)
   Covid-19 Vaccination Clinic  Name:  Erin Hall    MRN: [id number] DOB: 02-Feb-1961  01/05/2021  [name] was observed post Covid-19 immunization for 15 minutes without incident. She was provided with Vaccine Information Sheet and instruction to access the V-Safe system.   [name] was instructed to call 911 with any severe reactions post vaccine: Marland Kitchen Difficulty breathing  . Swelling of face and throat  . A fast heartbeat  . A bad rash all over body  . Dizziness and weakness

## 2021-01-14 ENCOUNTER — Other Ambulatory Visit (HOSPITAL_COMMUNITY): Payer: Self-pay

## 2021-01-14 DIAGNOSIS — Z20822 Contact with and (suspected) exposure to covid-19: Secondary | ICD-10-CM | POA: Diagnosis not present

## 2021-01-14 MED FILL — Alprazolam Tab 1 MG: ORAL | 30 days supply | Qty: 15 | Fill #1 | Status: AC

## 2021-01-14 MED FILL — Celecoxib Cap 200 MG: ORAL | 30 days supply | Qty: 30 | Fill #1 | Status: AC

## 2021-01-20 DIAGNOSIS — M9903 Segmental and somatic dysfunction of lumbar region: Secondary | ICD-10-CM | POA: Diagnosis not present

## 2021-01-20 DIAGNOSIS — M25512 Pain in left shoulder: Secondary | ICD-10-CM | POA: Diagnosis not present

## 2021-01-20 DIAGNOSIS — M21769 Unequal limb length (acquired), unspecified tibia and fibula: Secondary | ICD-10-CM | POA: Diagnosis not present

## 2021-01-20 DIAGNOSIS — M9904 Segmental and somatic dysfunction of sacral region: Secondary | ICD-10-CM | POA: Diagnosis not present

## 2021-01-20 DIAGNOSIS — M9902 Segmental and somatic dysfunction of thoracic region: Secondary | ICD-10-CM | POA: Diagnosis not present

## 2021-01-20 DIAGNOSIS — M9906 Segmental and somatic dysfunction of lower extremity: Secondary | ICD-10-CM | POA: Diagnosis not present

## 2021-01-20 DIAGNOSIS — M25522 Pain in left elbow: Secondary | ICD-10-CM | POA: Diagnosis not present

## 2021-01-20 DIAGNOSIS — M9901 Segmental and somatic dysfunction of cervical region: Secondary | ICD-10-CM | POA: Diagnosis not present

## 2021-01-20 DIAGNOSIS — M25542 Pain in joints of left hand: Secondary | ICD-10-CM | POA: Diagnosis not present

## 2021-01-25 DIAGNOSIS — H524 Presbyopia: Secondary | ICD-10-CM | POA: Diagnosis not present

## 2021-01-25 DIAGNOSIS — H52203 Unspecified astigmatism, bilateral: Secondary | ICD-10-CM | POA: Diagnosis not present

## 2021-02-02 ENCOUNTER — Ambulatory Visit: Payer: 59 | Admitting: Neurology

## 2021-02-02 ENCOUNTER — Other Ambulatory Visit: Payer: Self-pay

## 2021-02-02 ENCOUNTER — Encounter: Payer: Self-pay | Admitting: Neurology

## 2021-02-02 VITALS — BP 112/72 | HR 69 | Ht 64.0 in | Wt 153.4 lb

## 2021-02-02 DIAGNOSIS — R200 Anesthesia of skin: Secondary | ICD-10-CM

## 2021-02-02 DIAGNOSIS — R29898 Other symptoms and signs involving the musculoskeletal system: Secondary | ICD-10-CM | POA: Diagnosis not present

## 2021-02-02 DIAGNOSIS — M542 Cervicalgia: Secondary | ICD-10-CM

## 2021-02-02 DIAGNOSIS — R202 Paresthesia of skin: Secondary | ICD-10-CM | POA: Diagnosis not present

## 2021-02-02 DIAGNOSIS — M5412 Radiculopathy, cervical region: Secondary | ICD-10-CM

## 2021-02-02 DIAGNOSIS — G8929 Other chronic pain: Secondary | ICD-10-CM

## 2021-02-02 DIAGNOSIS — M79642 Pain in left hand: Secondary | ICD-10-CM | POA: Diagnosis not present

## 2021-02-02 NOTE — Patient Instructions (Signed)
Carpal Tunnel Syndrome  Carpal tunnel syndrome is a condition that causes pain, numbness, and weakness in your hand and fingers. The carpal tunnel is a narrow area located on the palm side of your wrist. Repeated wrist motion or certain diseases may cause swelling within the tunnel. This swelling pinches the main nerve in the wrist.The main nerve in the wrist is called the median nerve. What are the causes? This condition may be caused by: Repeated and forceful wrist and hand motions. Wrist injuries. Arthritis. A cyst or tumor in the carpal tunnel. Fluid buildup during pregnancy. Use of tools that vibrate. Sometimes the cause of this condition is not known. What increases the risk? The following factors may make you more likely to develop this condition: Having a job that requires you to repeatedly or forcefully move your wrist or hand or requires you to use tools that vibrate. This may include jobs that involve using computers, working on an [employer], or working with [place] such as Pension scheme manager. Being a woman. Having certain conditions, such as: Diabetes. Obesity. An underactive thyroid (hypothyroidism). Kidney failure. Rheumatoid arthritis. What are the signs or symptoms? Symptoms of this condition include: A tingling feeling in your fingers, especially in your thumb, index, and middle fingers. Tingling or numbness in your hand. An aching feeling in your entire arm, especially when your wrist and elbow are bent for a long time. Wrist pain that goes up your arm to your shoulder. Pain that goes down into your palm or fingers. A weak feeling in your hands. You may have trouble grabbing and holding items. Your symptoms may feel worse during the night. How is this diagnosed? This condition is diagnosed with a medical history and physical exam. You may also have tests, including: Electromyogram (EMG). This test measures electrical signals sent by your nerves into the  muscles. Nerve conduction study. This test measures how well electrical signals pass through your nerves. Imaging tests, such as X-rays, ultrasound, and MRI. These tests check for possible causes of your condition. How is this treated? This condition may be treated with: Lifestyle changes. It is important to stop or change the activity that caused your condition. Doing exercise and activities to strengthen and stretch your muscles and tendons (physical therapy). Making lifestyle changes to help with your condition and learning how to do your daily activities safely (occupational therapy). Medicines for pain and inflammation. This may include medicine that is injected into your wrist. A wrist splint or brace. Surgery. Follow these instructions at home: If you have a splint or brace: Wear the splint or brace as told by your health care provider. Remove it only as told by your health care provider. Loosen the splint or brace if your fingers tingle, become numb, or turn cold and blue. Keep the splint or brace clean. If the splint or brace is not waterproof: Do not let it get wet. Cover it with a watertight covering when you take a bath or shower. Managing pain, stiffness, and swelling If directed, put ice on the painful area. To do this: If you have a removeable splint or brace, remove it as told by your health care provider. Put ice in a plastic bag. Place a towel between your skin and the bag or between the splint or brace and the bag. Leave the ice on for 20 minutes, 2-3 times a day. Do not fall asleep with the cold pack on your skin. Remove the ice if your skin  turns bright red. This is very important. If you cannot feel pain, heat, or cold, you have a greater risk of damage to the area. Move your fingers often to reduce stiffness and swelling. General instructions Take over-the-counter and prescription medicines only as told by your health care provider. Rest your wrist and hand from  any activity that may be causing your pain. If your condition is work related, talk with your employer about changes that can be made, such as getting a wrist pad to use while typing. Do any exercises as told by your health care provider, physical therapist, or occupational therapist. Keep all follow-up visits. This is important. Contact a health care provider if: You have new symptoms. Your pain is not controlled with medicines. Your symptoms get worse. Get help right away if: You have severe numbness or tingling in your wrist or hand. Summary Carpal tunnel syndrome is a condition that causes pain, numbness, and weakness in your hand and fingers. It is usually caused by repeated wrist motions. Lifestyle changes and medicines are used to treat carpal tunnel syndrome. Surgery may be recommended. Follow your health care provider's instructions about wearing a splint, resting from activity, keeping follow-up visits, and calling for help. This information is not intended to replace advice given to you by your health care provider. Make sure you discuss any questions you have with your healthcare provider. Document Revised: 12/19/2019 Document Reviewed: 12/19/2019 Elsevier Patient Education  2022 [place] is a test to check how well your muscles and nerves are working. This procedure includes the combined use of electromyogram (EMG) and nerve conduction study (NCS). EMG is used to look for muscular disorders. NCS, which is also called electroneurogram, measures how well your nerves arecontrolling your muscles. The procedures are usually done together to check if your muscles and nerves are healthy. If the results of the tests are abnormal, this may indicatedisease or injury, such as a neuromuscular disease or peripheral nerve damage. Tell a health care provider about: Any allergies you have. All medicines you are taking, including vitamins, herbs, eye  drops, creams, and over-the-counter medicines. Any problems you or family members have had with anesthetic medicines. Any blood disorders you have. Any surgeries you have had. Any medical conditions you have. If you have a pacemaker. Whether you are pregnant or may be pregnant. What are the risks? Generally, this is a safe procedure. However, problems may occur, including: Infection where the electrodes were inserted. Bleeding. What happens before the procedure? Medicines Ask your health care provider about: Changing or stopping your regular medicines. This is especially important if you are taking diabetes medicines or blood thinners. Taking medicines such as aspirin and ibuprofen. These medicines can thin your blood. Do not take these medicines unless your health care provider tells you to take them. Taking over-the-counter medicines, vitamins, herbs, and supplements. General instructions Your health care provider may ask you to avoid: Beverages that have caffeine, such as coffee and tea. Any products that contain nicotine or tobacco. These products include cigarettes, e-cigarettes, and chewing tobacco. If you need help quitting, ask your health care provider. Do not use lotions or creams on the same day that you will be having the procedure. What happens during the procedure? For EMG  Your health care provider will ask you to stay in a position so that he or she can access the muscle that will be studied. You may be standing, sitting, or lying down. You may be given a  medicine that numbs the area (local anesthetic). A very thin needle that has an electrode will be inserted into your muscle. Another small electrode will be placed on your skin near the muscle. Your health care provider will ask you to continue to remain still. The electrodes will send a signal that tells about the electrical activity of your muscles. You may see this on a monitor or hear it in the room. After your  muscles have been studied at rest, your health care provider will ask you to contract or flex your muscles. The electrodes will send a signal that tells about the electrical activity of your muscles. Your health care provider will remove the electrodes and the electrode needles when the procedure is finished. The procedure may vary among health care providers and hospitals. For NCS  An electrode that records your nerve activity (recording electrode) will be placed on your skin by the muscle that is being studied. An electrode that is used as a reference (reference electrode) will be placed near the recording electrode. A paste or gel will be applied to your skin between the recording electrode and the reference electrode. Your nerve will be stimulated with a mild shock. Your health care provider will measure how much time it takes for your muscle to react. Your health care provider will remove the electrodes and the gel when the procedure is finished. The procedure may vary among health care providers and hospitals. What happens after the procedure? It is up to you to get the results of your procedure. Ask your health care provider, or the department that is doing the procedure, when your results will be ready. Your health care provider may: Give you medicines for any pain. Monitor the insertion sites to make sure that bleeding stops. Summary Electromyoneurogram is a test to check how well your muscles and nerves are working. If the results of the tests are abnormal, this may indicate disease or injury. This is a safe procedure. However, problems may occur, such as bleeding and infection. Your health care provider will do two tests to complete this procedure. One checks your muscles (EMG) and another checks your nerves (NCS). It is up to you to get the results of your procedure. Ask your health care provider, or the department that is doing the procedure, when your results will be ready. This  information is not intended to replace advice given to you by your health care provider. Make sure you discuss any questions you have with your healthcare provider. Document Revised: 04/24/2018 Document Reviewed: 04/06/2018 Elsevier Patient Education  [place].

## 2021-02-02 NOTE — Progress Notes (Signed)
[facility]    Provider:  Dr Erin Hall Requesting Provider: Radene Hall Erin Salinas, MD Primary Care Provider:  [name], MD  CC:  left hand numbness, tingling, pain  HPI:  Erin Hall is a 60 y.o. 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 Just on the left arm, been going on for 6-7 ,months, 6 months prior to that she fell and braced with her leg arm but didn't really have symptoms for several months later started noticing numbness in the left arm, moving it into a different position would help. Worsening now walking uo with pain ine palm of her hand and digits 1-3 are affected and the arm is numb and tingling, she has to get up at night and walk around, she feels her fine motor  is off, with time and movement will go away, she does have neck pain and radiation into the whole arm and shoulder and pain in the hand/burning,numbness in fingers. Slowly progressive and worsening and can be severe 20/10 in pain. No weakness. She has not worn splints, she spoke to Dr. 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 Prednisone did not help. Massage is helping.    Reviewed notes, labs and imaging from outside physicians, which showed:   I reviewed Dr. Dahlia Hall notes: Symptoms are positional in nature, when she is sleeping with both arms flexed and under her body which could results in compression, Erin Hall discussed referral to neurologist for possible nerve conduction study.  She reported left elbow pain, tingling numbness in the left hand, since the beginning of March of this year she wakes up at night with the pain numbness and tingling in her left hand and feels like a hot poker, only occurs at night, previously she had intermittent pain numbness  and tingling for about 6 months she fell on her hand about 6 months ago but this does not coincide, also soreness in her neck and left arm during the day.  She was seen by Dr. Christella Hall and he recommended a possible ulnar nerve compression study.She went to a sportsmed/chiro.    Review of Systems: Patient complains of symptoms per HPI as well as the following symptoms: neck pain Pertinent negatives and positives per HPI. All others negative.   Social History   Socioeconomic History   Marital status: Single    Spouse name: Not on file   Number of children: 0   Years of education: Not on file   Highest education level: Professional school degree (e.g., MD, DDS, DVM, JD)  Occupational History   Not on file  Tobacco Use   Smoking status: Never   Smokeless tobacco: Never  Vaping Use   Vaping Use: Never used  Substance and Sexual Activity   Alcohol use: No   Drug use: No   Sexual activity: Not Currently  Other Topics Concern   Not on file  Social History Narrative   Lives with husband    Caffeine intake: 2 cup sin the morning   Right handed      Nurse practitioner- graduate student    Social Determinants of Health   Financial Resource Strain: Not on file  Food Insecurity: Not on file  Transportation Needs: Not on file  Physical Activity: Not on file  Stress: Not on file  Social Connections: Not on file  Intimate Partner Violence: Not on file    Family History  Problem Relation Age of Onset   Breast cancer Mother 91   Colon cancer Father        dx in his 73s, recurrance at age 1   Colon cancer Paternal Grandfather        d. in his 59s   Colon cancer Paternal Aunt        dx in 75s; d. in 57s   Colon cancer Paternal Uncle        dx in 24s; d. in 63s   Colon cancer Paternal Uncle        dx in 68s, d. in 36s   Colon cancer Paternal Uncle        dx in 27s, d. in 64s   Colon cancer Cousin        pat first cousin   Skin cancer Sister        dx in her 33s   Colon  cancer Brother        dx in his 82s; pat 1/2 brother   Heart attack Maternal Grandmother    Heart failure Maternal Grandfather    Colon cancer Cousin        pat first cousin   Colon cancer Other        PGFs brother   Colon cancer Cousin        father's multiple paternal first cousins    Past Medical History:  Diagnosis Date   Family history of breast cancer    Family history of colon cancer    History of cellulitis    DOG BITE RIGHT THIGH 2002   History of colon polyps    History of seasonal allergies    History of uterine fibroid    Hyperlipidemia    hx of   Hypothyroidism    Right knee meniscal tear     Patient Active Problem List   Diagnosis Date Noted   Pain of left hand 02/02/2021   Genetic testing 08/13/2018   Family history of colon cancer    History of colon polyps    Family history of breast cancer    S/P hysterectomy 12/12/2013   Leiomyoma of body of uterus 04/18/2012    Past Surgical History:  Procedure Laterality Date   BILATERAL SALPINGECTOMY N/A 12/12/2013   Procedure: BILATERAL SALPINGECTOMY;  Surgeon: Erin Staff, MD;  Location: [facility];  Service: Gynecology;  Laterality: N/A;   BREAST REDUCTION SURGERY Bilateral 02-08-2007   COLONOSCOPY     I & D RIGHT THIGH ABSCESS  12-04-2000   KNEE ARTHROSCOPY Left several -- last one 2008   KNEE ARTHROSCOPY Right 08/20/2015   Procedure: ARTHROSCOPY RIGHT KNEE/ MEDIAL AND LATERAL PARTIAL MENISCECTOMY/ MEDIAL AND LATERAL PARTIAL CHONDROPLASTY/ BILATERAL [place] INJECTION;  Surgeon: Erin Cancel, MD;  Location: [facility];  Service: Orthopedics;  Laterality: Right;  AND BILATERAL HIPS   KNEE SURGERY Left age 59   osteomalacia   ROBOTIC ASSISTED TOTAL HYSTERECTOMY N/A 12/12/2013   Procedure: ROBOTIC ASSISTED TOTAL HYSTERECTOMY;  Surgeon: Erin Staff, MD;  Location: [facility];  Service: Gynecology;  Laterality: N/A;   SHOULDER ARTHROSCOPY WITH ROTATOR CUFF REPAIR AND SUBACROMIAL  DECOMPRESSION Right 01/25/2018   Procedure: RIGHT SHOULDER ARTHROSCOPY WITH ROTATOR CUFF REPAIR AND SUBACROMIAL DECOMPRESSION, DISTAL CLAVICLE RESECTION;  Surgeon: Erin Britain, MD;  Location: [facility];  Service: Orthopedics;  Laterality: Right;   TONSILLECTOMY     UTERINE  ARTERY EMBOLIZATION  04/17/2012   WISDOM TOOTH EXTRACTION      Current Outpatient Medications  Medication Sig Dispense Refill   ALPRAZolam (XANAX) 0.5 MG tablet Take 0.5 mg by mouth at bedtime as needed for anxiety or sleep.     ALPRAZolam (XANAX) 1 MG tablet TAKE 1/2 TABLET BY MOUTH AT BEDTIME AS NEEDED(DO NOT FILL EARLY) 15 tablet 2   ALPRAZolam (XANAX) 1 MG tablet TAKE 1/2 TABLET BY MOUTH AT BEDTIME AS NEEDED FOR SLEEP. MUST LAST 30 DAYS. 15 tablet 2   amoxicillin (AMOXIL) 875 MG tablet TAKE 1 TABLET BY MOUTH 2 TIMES A DAY FOR 7 DAYS 14 tablet 0   aspirin EC 325 MG tablet Take 1 tablet (325 mg total) by mouth daily. 30 tablet 0   Biotin w/ Vitamins C & E (HAIR/SKIN/NAILS PO) Take 1 tablet by mouth daily.     celecoxib (CELEBREX) 200 MG capsule Take 1 capsule (200 mg total) by mouth every evening. 30 capsule 1   celecoxib (CELEBREX) 200 MG capsule TAKE 1 CAPSULE BY MOUTH EVERY DAY. NEED TO TAKE PEPCID DAILY WITH THIS TO PREVENT GI ISSUES 30 capsule 2   celecoxib (CELEBREX) 200 MG capsule TAKE 1 CAPSULE BY MOUTH EVERY DAY. NEED TO TAKE PEPCID DAILY WITH THIS TO PREVENT GI ISSUES 30 capsule 2   celecoxib (CELEBREX) 200 MG capsule TAKE 1 CAPSULE BY MOUTH EVERY DAY. NEED TO TAKE PEPCID DAILY WITH THIS TO PREVENT GI ISSUES 30 capsule 2   cephALEXin (KEFLEX) 500 MG capsule TAKE 1 CAPSULE BY MOUTH 4 TIMES DAILY FOR 10 DAYS 40 capsule 1   cetirizine (ZYRTEC) 10 MG tablet Take 10 mg by mouth every evening.     Cholecalciferol (VITAMIN D3) 5000 units CAPS Take 10,000 Units by mouth daily.     COVID-19 mRNA Vac-TriS, Pfizer, (PFIZER-BIONT COVID-19 VAC-TRIS) SUSP injection Inject into the muscle. 0.3 mL 0   cyclobenzaprine (FLEXERIL) 10 MG  tablet Take 1 tablet (10 mg total) by mouth 3 (three) times daily as needed for muscle spasms. 30 tablet 0   estradiol (VIVELLE-DOT) 0.1 MG/24HR patch APPLY 1 PATCH ONTO THE SKIN TWICE A WEEK 24 patch 4   fish oil-omega-3 fatty acids 1000 MG capsule Take 1 g by mouth every evening.     levothyroxine (SYNTHROID) 50 MCG tablet Take 1 tablet (50 mcg total) by mouth every morning on an empty stomach 90 tablet 4   levothyroxine (SYNTHROID, LEVOTHROID) 50 MCG tablet Take 50 mcg by mouth daily before breakfast.   12   magnesium oxide (MAG-OX) 400 MG tablet Take 400 mg by mouth daily.     MINIVELLE 0.1 MG/24HR patch Place 1 patch onto the skin 2 (two) times a week. Wednesdays and Saturdays.  2   Multiple Vitamin (MULTIVITAMIN WITH MINERALS) TABS Take 1 tablet by mouth every evening.     ondansetron (ZOFRAN) 4 MG tablet Take 1 tablet (4 mg total) by mouth every 8 (eight) hours as needed for nausea or vomiting. 10 tablet 0   oxyCODONE-acetaminophen (PERCOCET) 5-325 MG tablet Take 1 tablet by mouth every 4 (four) hours as needed (max 6 q). 30 tablet 0   Sod Picosulfate-Mag Ox-Cit Acd 10-3.5-12 MG-GM -GM/160ML SOLN TAKE AS DIRECTED 320 mL 0   vitamin C (ASCORBIC ACID) 500 MG tablet Take 500 mg by mouth every evening.     No current facility-administered medications for this visit.    Allergies as of 02/02/2021   (No Known Allergies)    Vitals: BP  112/72   Pulse 69   Ht 5\' 4"  (1.626 m)   Wt 153 lb 6.4 oz (69.6 kg)   LMP 02/27/2014   BMI 26.33 kg/m  Last Weight:  Wt Readings from Last 1 Encounters:  02/02/21 153 lb 6.4 oz (69.6 kg)   Last Height:   Ht Readings from Last 1 Encounters:  02/02/21 5\' 4"  (1.626 m)     Physical exam: Exam: Gen: NAD, conversant, well nourised, well groomed                     CV: RRR, no MRG. No Carotid Bruits. No peripheral edema, warm, nontender Eyes: Conjunctivae clear without exudates or hemorrhage  Neuro: Detailed Neurologic Exam  Speech:    Speech is  normal; fluent and spontaneous with normal comprehension.  Cognition:    The patient is oriented to person, place, and time;     recent and remote memory intact;     language fluent;     normal attention, concentration,     fund of knowledge Cranial Nerves:    The pupils are equal, round, and reactive to light. The fundi are flat Visual fields are full to finger confrontation. Extraocular movements are intact. Trigeminal sensation is intact and the muscles of mastication are normal. The face is symmetric. The palate elevates in the midline. Hearing intact. Voice is normal. Shoulder shrug is normal. The tongue has normal motion without fasciculations.   Coordination: Normal   Gait:    normal.   Motor Observation:    No asymmetry, no atrophy, and no involuntary movements noted. Tone:    Normal muscle tone.    Posture:    Posture is normal. normal erect    Strength: slight left prox weakness otherwise strength is V/V in the upper and lower limbs.      Sensation: intact to LT     Reflex Exam:  DTR's:    Deep tendon reflexes in the upper and lower extremities are normal bilaterally.   Toes:    The toes are downgoing bilaterally.   Clonus:    Clonus is absent.    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 Likely CTS but given whol arm pain cannot rule out c5-c6 radic.   MRI cervical spine: failed conservative therapy, worsening, left side, abnormal exam (prox left arm weakness), eval for cervical radic/stenosis for surgical or interventional options. Emg/ncs: bilat upper  Orders Placed This Encounter  Procedures   MR CERVICAL SPINE WO CONTRAST   NCV with EMG(electromyography)    No orders of the defined types were placed in this encounter.   Cc: Erin Hall, Erin Salinas, MD,  Erin Fess, MD  Sarina Ill, MD  [facility] [address] [place], [place]  [address]  Phone [phone] Fax [phone]

## 2021-02-03 ENCOUNTER — Telehealth: Payer: Self-pay | Admitting: Neurology

## 2021-02-03 NOTE — Telephone Encounter (Signed)
Spoke with [place] @ [facility] [phone]) to check CPT [phone]. Langley Gauss states no PA is required. Reference [id number]. We will call her to schedule with [facility].

## 2021-02-04 ENCOUNTER — Encounter: Payer: Self-pay | Admitting: *Deleted

## 2021-02-16 ENCOUNTER — Other Ambulatory Visit (HOSPITAL_COMMUNITY): Payer: Self-pay

## 2021-02-16 MED ORDER — CELECOXIB 200 MG PO CAPS
200.0000 mg | ORAL_CAPSULE | Freq: Every day | ORAL | 2 refills | Status: DC
  Filled 2021-02-16: qty 30, 30d supply, fill #0
  Filled 2021-03-19: qty 30, 30d supply, fill #1
  Filled 2021-04-12 – 2021-04-22 (×2): qty 30, 30d supply, fill #2

## 2021-02-16 MED FILL — Alprazolam Tab 1 MG: ORAL | 30 days supply | Qty: 15 | Fill #2 | Status: AC

## 2021-02-17 ENCOUNTER — Other Ambulatory Visit: Payer: Self-pay

## 2021-02-17 ENCOUNTER — Other Ambulatory Visit (HOSPITAL_COMMUNITY): Payer: Self-pay

## 2021-02-17 ENCOUNTER — Ambulatory Visit (INDEPENDENT_AMBULATORY_CARE_PROVIDER_SITE_OTHER): Payer: 59

## 2021-02-17 DIAGNOSIS — R200 Anesthesia of skin: Secondary | ICD-10-CM | POA: Diagnosis not present

## 2021-02-17 DIAGNOSIS — M79642 Pain in left hand: Secondary | ICD-10-CM

## 2021-02-17 DIAGNOSIS — M5412 Radiculopathy, cervical region: Secondary | ICD-10-CM

## 2021-02-17 DIAGNOSIS — R202 Paresthesia of skin: Secondary | ICD-10-CM | POA: Diagnosis not present

## 2021-02-17 DIAGNOSIS — M542 Cervicalgia: Secondary | ICD-10-CM

## 2021-02-17 DIAGNOSIS — R29898 Other symptoms and signs involving the musculoskeletal system: Secondary | ICD-10-CM

## 2021-02-17 DIAGNOSIS — G8929 Other chronic pain: Secondary | ICD-10-CM

## 2021-02-17 MED ORDER — BIMATOPROST 0.03 % EX SOLN
CUTANEOUS | 99 refills | Status: DC
  Filled 2021-02-17: qty 5, 70d supply, fill #0

## 2021-02-18 ENCOUNTER — Other Ambulatory Visit (HOSPITAL_COMMUNITY): Payer: Self-pay

## 2021-02-18 MED FILL — Estradiol TD Patch Twice Weekly 0.1 MG/24HR: TRANSDERMAL | 84 days supply | Qty: 24 | Fill #0 | Status: AC

## 2021-02-19 DIAGNOSIS — E7800 Pure hypercholesterolemia, unspecified: Secondary | ICD-10-CM | POA: Diagnosis not present

## 2021-02-19 DIAGNOSIS — G4700 Insomnia, unspecified: Secondary | ICD-10-CM | POA: Diagnosis not present

## 2021-02-19 DIAGNOSIS — E039 Hypothyroidism, unspecified: Secondary | ICD-10-CM | POA: Diagnosis not present

## 2021-02-19 DIAGNOSIS — Z0000 Encounter for general adult medical examination without abnormal findings: Secondary | ICD-10-CM | POA: Diagnosis not present

## 2021-02-24 ENCOUNTER — Telehealth: Payer: Self-pay | Admitting: *Deleted

## 2021-02-24 NOTE — Telephone Encounter (Signed)
-----   Message from Melvenia Beam, MD sent at 02/23/2021  1:45 PM EDT ----- MRI of the cervical spine is unremarkable, essentially normal, some minimal arthritic/degenerative changes that are causing no problems. thanks

## 2021-02-24 NOTE — Telephone Encounter (Signed)
Called the pt and LVM (ok per DPR) advising of MRI c-spine results. Left office number in message asking for call back if she has any questions. Also MD can discuss results at EMG on Monday if she would like.

## 2021-02-26 DIAGNOSIS — E039 Hypothyroidism, unspecified: Secondary | ICD-10-CM | POA: Diagnosis not present

## 2021-02-26 DIAGNOSIS — G4700 Insomnia, unspecified: Secondary | ICD-10-CM | POA: Diagnosis not present

## 2021-02-26 DIAGNOSIS — Z0000 Encounter for general adult medical examination without abnormal findings: Secondary | ICD-10-CM | POA: Diagnosis not present

## 2021-02-26 DIAGNOSIS — E7800 Pure hypercholesterolemia, unspecified: Secondary | ICD-10-CM | POA: Diagnosis not present

## 2021-03-01 ENCOUNTER — Ambulatory Visit: Payer: 59 | Admitting: Neurology

## 2021-03-01 ENCOUNTER — Other Ambulatory Visit (HOSPITAL_COMMUNITY): Payer: Self-pay

## 2021-03-01 ENCOUNTER — Ambulatory Visit (INDEPENDENT_AMBULATORY_CARE_PROVIDER_SITE_OTHER): Payer: 59 | Admitting: Neurology

## 2021-03-01 DIAGNOSIS — Z0289 Encounter for other administrative examinations: Secondary | ICD-10-CM

## 2021-03-01 DIAGNOSIS — G5602 Carpal tunnel syndrome, left upper limb: Secondary | ICD-10-CM

## 2021-03-01 DIAGNOSIS — M79642 Pain in left hand: Secondary | ICD-10-CM

## 2021-03-01 MED ORDER — ATORVASTATIN CALCIUM 10 MG PO TABS
10.0000 mg | ORAL_TABLET | Freq: Every day | ORAL | 3 refills | Status: AC
  Filled 2021-03-01: qty 90, 90d supply, fill #0

## 2021-03-01 NOTE — Progress Notes (Signed)
Full Name: Agueda Houpt Gender: Female MRN #: [id number] Date of Birth: 11/24/1960    Visit Date: 03/01/2021 14:55 Age: 60 Years Examining Physician: Sarina Ill, MD  Requesting Provider: Aretta Nip, MD Primary Care Provider:  [name], MD  History: Left hand numbness and tingling  Summary: Nerve Conduction Studies were performed on the left upper extremity  The left  median APB motor nerve showed prolonged distal onset latency (6.3 ms, N<4.4).The left  Median 2nd Digit orthodromic sensory nerve showed no response.   The left median/ulnar (palm) comparison nerve showed prolonged distal peak latency (Median Palm, 4.7 ms, N<2.2) and abnormal peak latency difference (Median Palm-Ulnar Palm, 2.5 ms, N<0.4) with a relative median delay.    Conclusion: This is an abnormal study. There is electrophysiologic evidence of moderately-severe left Carpal Tunnel Syndrome. No suggestion of polyneuropathy  or radiculopathy.     Sarina Ill, M.D.  [facility] [address] [place], [place] [address] Tel: [phone] Fax: [phone]  Verbal informed consent was obtained from the patient, patient was informed of potential risk of procedure, including bruising, bleeding, hematoma formation, infection, muscle weakness, muscle pain, numbness, among others.        [place]    Nerve / Sites Muscle Latency Ref. Amplitude Ref. Rel Amp Segments Distance Velocity Ref. Area    ms ms mV mV %  cm m/s m/s mVms  L Median - APB     Wrist APB 6.3 ?4.4 6.8 ?4.0 100 Wrist - APB 7   25.7     Upper arm APB 10.2  6.1  89.6 Upper arm - Wrist 20 51 ?49 23.2  L Ulnar - ADM     Wrist ADM 3.0 ?3.3 11.1 ?6.0 100 Wrist - ADM 7   45.8     B.Elbow ADM 6.8  9.8  88 B.Elbow - Wrist 19 51 ?49 41.9     A.Elbow ADM 8.7  9.6  97.7 A.Elbow - B.Elbow 10 51 ?49 40.4         SNC    Nerve / Sites Rec. Site Peak Lat Ref.  Amp Ref. Segments Distance Peak Diff Ref.    ms ms V V   cm ms ms  L Median, Ulnar - Transcarpal comparison     Median Palm Wrist 4.7 ?2.2 5 ?35 Median Palm - Wrist 8       Ulnar Palm Wrist 2.2 ?2.2 56 ?12 Ulnar Palm - Wrist 8          Median Palm - Ulnar Palm  2.5 ?0.4  L Median - Orthodromic (Dig II, Mid palm)     Dig II Wrist NR ?3.4 NR ?10 Dig II - Wrist 13    L Ulnar - Orthodromic, (Dig V, Mid palm)     Dig V Wrist 3.1 ?3.1 13 ?5 Dig V - Wrist 24             F  Wave    Nerve F Lat Ref.   ms ms  L Ulnar - ADM 29.0 ?32.0      EMG Summary Table    Spontaneous MUAP Recruitment  Muscle IA Fib PSW Fasc Other Amp Dur. Poly Pattern  L. Cervical paraspinals (low) Normal None None None _______ Normal Normal Normal Normal  L. Deltoid Normal None None None _______ Normal Normal Normal Normal  L. Triceps brachii Normal None None None _______ Normal Normal Normal Normal  L. Pronator teres Normal  None None None _______ Normal Normal Normal Normal  L. Opponens pollicis Normal None None None _______ Normal Normal Normal Normal  L. First dorsal interosseous Normal None None None _______ Normal Normal Normal Normal

## 2021-03-02 NOTE — Progress Notes (Signed)
See procedure note.

## 2021-03-02 NOTE — Procedures (Signed)
Full Name: Erin Hall Gender: Female MRN #: [id number] Date of Birth: Dec 01, 1960    Visit Date: 03/01/2021 14:55 Age: 60 Years Examining Physician: Sarina Ill, MD  Requesting Provider: Aretta Nip, MD Primary Care Provider:  [name], MD  History: Left hand numbness and tingling  Summary: Nerve Conduction Studies were performed on the left upper extremity  The left  median APB motor nerve showed prolonged distal onset latency (6.3 ms, N<4.4).The left  Median 2nd Digit orthodromic sensory nerve showed no response.   The left median/ulnar (palm) comparison nerve showed prolonged distal peak latency (Median Palm, 4.7 ms, N<2.2) and abnormal peak latency difference (Median Palm-Ulnar Palm, 2.5 ms, N<0.4) with a relative median delay.    Conclusion: This is an abnormal study. There is electrophysiologic evidence of moderately-severe left Carpal Tunnel Syndrome. No suggestion of polyneuropathy  or radiculopathy.     Sarina Ill, M.D.  [facility] [address], [place], [place] [address] Tel: [phone] Fax: [phone]  Verbal informed consent was obtained from the patient, patient was informed of potential risk of procedure, including bruising, bleeding, hematoma formation, infection, muscle weakness, muscle pain, numbness, among others.        [place]    Nerve / Sites Muscle Latency Ref. Amplitude Ref. Rel Amp Segments Distance Velocity Ref. Area    ms ms mV mV %  cm m/s m/s mVms  L Median - APB     Wrist APB 6.3 ?4.4 6.8 ?4.0 100 Wrist - APB 7   25.7     Upper arm APB 10.2  6.1  89.6 Upper arm - Wrist 20 51 ?49 23.2  L Ulnar - ADM     Wrist ADM 3.0 ?3.3 11.1 ?6.0 100 Wrist - ADM 7   45.8     B.Elbow ADM 6.8  9.8  88 B.Elbow - Wrist 19 51 ?49 41.9     A.Elbow ADM 8.7  9.6  97.7 A.Elbow - B.Elbow 10 51 ?49 40.4         SNC    Nerve / Sites Rec. Site Peak Lat Ref.  Amp Ref. Segments Distance Peak Diff Ref.    ms ms V V   cm ms ms  L Median, Ulnar - Transcarpal comparison     Median Palm Wrist 4.7 ?2.2 5 ?35 Median Palm - Wrist 8       Ulnar Palm Wrist 2.2 ?2.2 56 ?12 Ulnar Palm - Wrist 8          Median Palm - Ulnar Palm  2.5 ?0.4  L Median - Orthodromic (Dig II, Mid palm)     Dig II Wrist NR ?3.4 NR ?10 Dig II - Wrist 13    L Ulnar - Orthodromic, (Dig V, Mid palm)     Dig V Wrist 3.1 ?3.1 13 ?5 Dig V - Wrist 9             F  Wave    Nerve F Lat Ref.   ms ms  L Ulnar - ADM 29.0 ?32.0      EMG Summary Table    Spontaneous MUAP Recruitment  Muscle IA Fib PSW Fasc Other Amp Dur. Poly Pattern  L. Cervical paraspinals (low) Normal None None None _______ Normal Normal Normal Normal  L. Deltoid Normal None None None _______ Normal Normal Normal Normal  L. Triceps brachii Normal None None None _______ Normal Normal Normal Normal  L. Pronator teres Normal  None None None _______ Normal Normal Normal Normal  L. Opponens pollicis Normal None None None _______ Normal Normal Normal Normal  L. First dorsal interosseous Normal None None None _______ Normal Normal Normal Normal

## 2021-03-03 ENCOUNTER — Other Ambulatory Visit (HOSPITAL_COMMUNITY): Payer: Self-pay

## 2021-03-04 ENCOUNTER — Telehealth: Payer: Self-pay | Admitting: Neurology

## 2021-03-04 NOTE — Telephone Encounter (Signed)
Referral sent to [facility] for Dr. Amedeo Plenty. Phone: [phone].

## 2021-03-10 ENCOUNTER — Other Ambulatory Visit (HOSPITAL_COMMUNITY): Payer: Self-pay

## 2021-03-10 MED ORDER — AMOXICILLIN 500 MG PO CAPS
500.0000 mg | ORAL_CAPSULE | Freq: Two times a day (BID) | ORAL | 0 refills | Status: AC
  Filled 2021-03-10: qty 14, 7d supply, fill #0

## 2021-03-19 ENCOUNTER — Other Ambulatory Visit (HOSPITAL_COMMUNITY): Payer: Self-pay

## 2021-03-22 ENCOUNTER — Other Ambulatory Visit (HOSPITAL_COMMUNITY): Payer: Self-pay

## 2021-03-26 ENCOUNTER — Other Ambulatory Visit (HOSPITAL_COMMUNITY): Payer: Self-pay

## 2021-04-12 ENCOUNTER — Other Ambulatory Visit (HOSPITAL_COMMUNITY): Payer: Self-pay

## 2021-04-13 ENCOUNTER — Other Ambulatory Visit (HOSPITAL_COMMUNITY): Payer: Self-pay

## 2021-04-13 MED ORDER — ALPRAZOLAM 1 MG PO TABS
0.5000 mg | ORAL_TABLET | Freq: Every evening | ORAL | 2 refills | Status: DC | PRN
  Filled 2021-04-13: qty 15, 30d supply, fill #0
  Filled 2021-05-13: qty 15, 30d supply, fill #1
  Filled 2021-06-16: qty 15, 30d supply, fill #2

## 2021-04-22 ENCOUNTER — Other Ambulatory Visit (HOSPITAL_COMMUNITY): Payer: Self-pay

## 2021-05-13 ENCOUNTER — Other Ambulatory Visit (HOSPITAL_COMMUNITY): Payer: Self-pay

## 2021-05-19 DIAGNOSIS — G5602 Carpal tunnel syndrome, left upper limb: Secondary | ICD-10-CM | POA: Diagnosis not present

## 2021-05-19 DIAGNOSIS — M79642 Pain in left hand: Secondary | ICD-10-CM | POA: Diagnosis not present

## 2021-05-19 DIAGNOSIS — M542 Cervicalgia: Secondary | ICD-10-CM | POA: Diagnosis not present

## 2021-05-21 ENCOUNTER — Other Ambulatory Visit (HOSPITAL_COMMUNITY): Payer: Self-pay

## 2021-05-21 MED ORDER — CELECOXIB 200 MG PO CAPS
200.0000 mg | ORAL_CAPSULE | Freq: Every day | ORAL | 2 refills | Status: DC
  Filled 2021-05-21: qty 30, 30d supply, fill #0
  Filled 2021-06-23: qty 30, 30d supply, fill #1
  Filled 2021-07-21: qty 30, 30d supply, fill #2

## 2021-05-24 ENCOUNTER — Other Ambulatory Visit (HOSPITAL_COMMUNITY): Payer: Self-pay

## 2021-05-24 MED FILL — Estradiol TD Patch Twice Weekly 0.1 MG/24HR: TRANSDERMAL | 84 days supply | Qty: 24 | Fill #1 | Status: AC

## 2021-06-15 ENCOUNTER — Other Ambulatory Visit: Payer: Self-pay | Admitting: Neurosurgery

## 2021-06-15 ENCOUNTER — Other Ambulatory Visit (HOSPITAL_COMMUNITY): Payer: Self-pay | Admitting: Neurosurgery

## 2021-06-15 DIAGNOSIS — M4722 Other spondylosis with radiculopathy, cervical region: Secondary | ICD-10-CM

## 2021-06-15 DIAGNOSIS — M25512 Pain in left shoulder: Secondary | ICD-10-CM

## 2021-06-15 DIAGNOSIS — Z6827 Body mass index (BMI) 27.0-27.9, adult: Secondary | ICD-10-CM | POA: Diagnosis not present

## 2021-06-16 ENCOUNTER — Other Ambulatory Visit (HOSPITAL_COMMUNITY): Payer: Self-pay

## 2021-06-23 ENCOUNTER — Other Ambulatory Visit (HOSPITAL_COMMUNITY): Payer: Self-pay

## 2021-06-29 ENCOUNTER — Ambulatory Visit (HOSPITAL_COMMUNITY)
Admission: RE | Admit: 2021-06-29 | Discharge: 2021-06-29 | Disposition: A | Discharge status: Home / Self Care | Payer: 59 | Source: Ambulatory Visit | Attending: Neurosurgery | Admitting: Neurosurgery

## 2021-06-29 ENCOUNTER — Other Ambulatory Visit: Payer: Self-pay

## 2021-06-29 DIAGNOSIS — M50222 Other cervical disc displacement at C5-C6 level: Secondary | ICD-10-CM | POA: Diagnosis not present

## 2021-06-29 DIAGNOSIS — M4722 Other spondylosis with radiculopathy, cervical region: Secondary | ICD-10-CM | POA: Diagnosis not present

## 2021-06-29 DIAGNOSIS — M47812 Spondylosis without myelopathy or radiculopathy, cervical region: Secondary | ICD-10-CM | POA: Diagnosis not present

## 2021-06-29 DIAGNOSIS — M542 Cervicalgia: Secondary | ICD-10-CM | POA: Diagnosis not present

## 2021-06-29 DIAGNOSIS — M19012 Primary osteoarthritis, left shoulder: Secondary | ICD-10-CM | POA: Diagnosis not present

## 2021-06-29 DIAGNOSIS — M75112 Incomplete rotator cuff tear or rupture of left shoulder, not specified as traumatic: Secondary | ICD-10-CM | POA: Diagnosis not present

## 2021-06-29 DIAGNOSIS — M4692 Unspecified inflammatory spondylopathy, cervical region: Secondary | ICD-10-CM | POA: Diagnosis not present

## 2021-06-29 DIAGNOSIS — M25512 Pain in left shoulder: Secondary | ICD-10-CM | POA: Diagnosis not present

## 2021-07-02 DIAGNOSIS — Z111 Encounter for screening for respiratory tuberculosis: Secondary | ICD-10-CM | POA: Diagnosis not present

## 2021-07-06 DIAGNOSIS — R030 Elevated blood-pressure reading, without diagnosis of hypertension: Secondary | ICD-10-CM | POA: Diagnosis not present

## 2021-07-06 DIAGNOSIS — M25512 Pain in left shoulder: Secondary | ICD-10-CM | POA: Diagnosis not present

## 2021-07-06 DIAGNOSIS — Z6827 Body mass index (BMI) 27.0-27.9, adult: Secondary | ICD-10-CM | POA: Diagnosis not present

## 2021-07-06 DIAGNOSIS — M4722 Other spondylosis with radiculopathy, cervical region: Secondary | ICD-10-CM | POA: Diagnosis not present

## 2021-07-09 ENCOUNTER — Other Ambulatory Visit (HOSPITAL_COMMUNITY): Payer: Self-pay

## 2021-07-21 ENCOUNTER — Other Ambulatory Visit (HOSPITAL_COMMUNITY): Payer: Self-pay

## 2021-07-21 DIAGNOSIS — M7542 Impingement syndrome of left shoulder: Secondary | ICD-10-CM | POA: Diagnosis not present

## 2021-07-21 DIAGNOSIS — M542 Cervicalgia: Secondary | ICD-10-CM | POA: Diagnosis not present

## 2021-07-21 MED ORDER — ALPRAZOLAM 1 MG PO TABS
0.5000 mg | ORAL_TABLET | Freq: Every evening | ORAL | 2 refills | Status: DC | PRN
  Filled 2021-07-21: qty 15, 30d supply, fill #0
  Filled 2021-08-19: qty 15, 30d supply, fill #1
  Filled 2021-09-27: qty 15, 30d supply, fill #2

## 2021-07-23 ENCOUNTER — Other Ambulatory Visit (HOSPITAL_COMMUNITY): Payer: Self-pay

## 2021-07-23 DIAGNOSIS — M13862 Other specified arthritis, left knee: Secondary | ICD-10-CM | POA: Diagnosis not present

## 2021-07-23 DIAGNOSIS — M25561 Pain in right knee: Secondary | ICD-10-CM | POA: Diagnosis not present

## 2021-07-23 DIAGNOSIS — M13861 Other specified arthritis, right knee: Secondary | ICD-10-CM | POA: Diagnosis not present

## 2021-07-23 DIAGNOSIS — M170 Bilateral primary osteoarthritis of knee: Secondary | ICD-10-CM | POA: Diagnosis not present

## 2021-07-23 MED ORDER — CELECOXIB 200 MG PO CAPS
200.0000 mg | ORAL_CAPSULE | Freq: Every day | ORAL | 3 refills | Status: DC
  Filled 2021-07-23 – 2021-08-25 (×2): qty 90, 90d supply, fill #0
  Filled 2021-12-02: qty 90, 90d supply, fill #1
  Filled 2022-02-03 – 2022-06-15 (×2): qty 90, 90d supply, fill #2

## 2021-07-30 DIAGNOSIS — M25562 Pain in left knee: Secondary | ICD-10-CM | POA: Diagnosis not present

## 2021-07-30 DIAGNOSIS — M25561 Pain in right knee: Secondary | ICD-10-CM | POA: Diagnosis not present

## 2021-07-30 DIAGNOSIS — M1712 Unilateral primary osteoarthritis, left knee: Secondary | ICD-10-CM | POA: Diagnosis not present

## 2021-07-30 DIAGNOSIS — M1711 Unilateral primary osteoarthritis, right knee: Secondary | ICD-10-CM | POA: Diagnosis not present

## 2021-07-30 DIAGNOSIS — M170 Bilateral primary osteoarthritis of knee: Secondary | ICD-10-CM | POA: Diagnosis not present

## 2021-08-04 DIAGNOSIS — M542 Cervicalgia: Secondary | ICD-10-CM | POA: Diagnosis not present

## 2021-08-04 DIAGNOSIS — M1712 Unilateral primary osteoarthritis, left knee: Secondary | ICD-10-CM | POA: Diagnosis not present

## 2021-08-04 DIAGNOSIS — M170 Bilateral primary osteoarthritis of knee: Secondary | ICD-10-CM | POA: Diagnosis not present

## 2021-08-04 DIAGNOSIS — M1711 Unilateral primary osteoarthritis, right knee: Secondary | ICD-10-CM | POA: Diagnosis not present

## 2021-08-04 DIAGNOSIS — M25512 Pain in left shoulder: Secondary | ICD-10-CM | POA: Diagnosis not present

## 2021-08-19 ENCOUNTER — Other Ambulatory Visit (HOSPITAL_COMMUNITY): Payer: Self-pay

## 2021-08-19 MED FILL — Estradiol TD Patch Twice Weekly 0.1 MG/24HR: TRANSDERMAL | 84 days supply | Qty: 24 | Fill #2 | Status: AC

## 2021-08-25 ENCOUNTER — Other Ambulatory Visit (HOSPITAL_COMMUNITY): Payer: Self-pay

## 2021-08-25 MED ORDER — CELECOXIB 200 MG PO CAPS
200.0000 mg | ORAL_CAPSULE | Freq: Every day | ORAL | 2 refills | Status: DC
  Filled 2022-03-08: qty 30, 30d supply, fill #0
  Filled 2022-04-06: qty 30, 30d supply, fill #1
  Filled 2022-05-12: qty 30, 30d supply, fill #2

## 2021-09-16 DIAGNOSIS — M1712 Unilateral primary osteoarthritis, left knee: Secondary | ICD-10-CM | POA: Diagnosis not present

## 2021-09-16 DIAGNOSIS — M65862 Other synovitis and tenosynovitis, left lower leg: Secondary | ICD-10-CM | POA: Diagnosis not present

## 2021-09-16 DIAGNOSIS — M25562 Pain in left knee: Secondary | ICD-10-CM | POA: Diagnosis not present

## 2021-09-27 ENCOUNTER — Other Ambulatory Visit (HOSPITAL_COMMUNITY): Payer: Self-pay

## 2021-11-01 DIAGNOSIS — Z1231 Encounter for screening mammogram for malignant neoplasm of breast: Secondary | ICD-10-CM | POA: Diagnosis not present

## 2021-12-02 ENCOUNTER — Other Ambulatory Visit (HOSPITAL_COMMUNITY): Payer: Self-pay

## 2021-12-03 ENCOUNTER — Other Ambulatory Visit (HOSPITAL_COMMUNITY): Payer: Self-pay

## 2021-12-06 ENCOUNTER — Other Ambulatory Visit (HOSPITAL_COMMUNITY): Payer: Self-pay

## 2021-12-08 ENCOUNTER — Other Ambulatory Visit (HOSPITAL_COMMUNITY): Payer: Self-pay

## 2021-12-09 ENCOUNTER — Other Ambulatory Visit (HOSPITAL_COMMUNITY): Payer: Self-pay

## 2021-12-13 ENCOUNTER — Other Ambulatory Visit (HOSPITAL_COMMUNITY): Payer: Self-pay

## 2021-12-16 ENCOUNTER — Other Ambulatory Visit (HOSPITAL_COMMUNITY): Payer: Self-pay

## 2021-12-16 DIAGNOSIS — E039 Hypothyroidism, unspecified: Secondary | ICD-10-CM | POA: Diagnosis not present

## 2021-12-16 DIAGNOSIS — Z90710 Acquired absence of both cervix and uterus: Secondary | ICD-10-CM | POA: Diagnosis not present

## 2021-12-16 DIAGNOSIS — Z79890 Hormone replacement therapy (postmenopausal): Secondary | ICD-10-CM | POA: Diagnosis not present

## 2021-12-16 DIAGNOSIS — G4700 Insomnia, unspecified: Secondary | ICD-10-CM | POA: Diagnosis not present

## 2021-12-16 DIAGNOSIS — E785 Hyperlipidemia, unspecified: Secondary | ICD-10-CM | POA: Diagnosis not present

## 2021-12-16 DIAGNOSIS — Z9079 Acquired absence of other genital organ(s): Secondary | ICD-10-CM | POA: Diagnosis not present

## 2021-12-16 DIAGNOSIS — Z01419 Encounter for gynecological examination (general) (routine) without abnormal findings: Secondary | ICD-10-CM | POA: Diagnosis not present

## 2021-12-16 MED ORDER — ALPRAZOLAM 0.5 MG PO TABS
0.5000 mg | ORAL_TABLET | Freq: Every evening | ORAL | 2 refills | Status: DC | PRN
  Filled 2021-12-16: qty 30, 30d supply, fill #0
  Filled 2022-02-03: qty 30, 30d supply, fill #1
  Filled 2022-03-08: qty 30, 30d supply, fill #2

## 2021-12-17 ENCOUNTER — Other Ambulatory Visit (HOSPITAL_COMMUNITY): Payer: Self-pay

## 2021-12-20 ENCOUNTER — Other Ambulatory Visit (HOSPITAL_COMMUNITY): Payer: Self-pay

## 2021-12-20 MED ORDER — ESTRADIOL 0.1 MG/24HR TD PTTW
1.0000 | MEDICATED_PATCH | TRANSDERMAL | 4 refills | Status: DC
  Filled 2021-12-20: qty 24, 84d supply, fill #0
  Filled 2022-03-08: qty 24, 84d supply, fill #1
  Filled 2022-07-08: qty 24, 84d supply, fill #2
  Filled 2022-09-15: qty 24, 84d supply, fill #3

## 2021-12-21 ENCOUNTER — Other Ambulatory Visit (HOSPITAL_COMMUNITY): Payer: Self-pay

## 2021-12-30 IMAGING — MR MR SHOULDER*L* W/O CM
6 series · 37 of 40 positions shown · non-contrast
Comparison: None of the left shoulder. Right shoulder MRI
12/25/2017.

CLINICAL DATA: Neck, left shoulder and arm pain for 6 months.

EXAM:
MRI OF THE LEFT SHOULDER WITHOUT CONTRAST
TECHNIQUE: Multiplanar, multisequence MR imaging of the shoulder was performed.
No intravenous contrast was administered.

[Series 9: T2 fat-sat · axial · left · 4.0mm · 0.36mm/px · z∈[-115,-15]mm · 7 of 22 slices shown (1 of 4)]
[im 1/22]
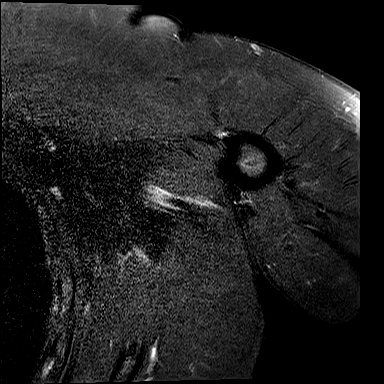
[im 4/22]
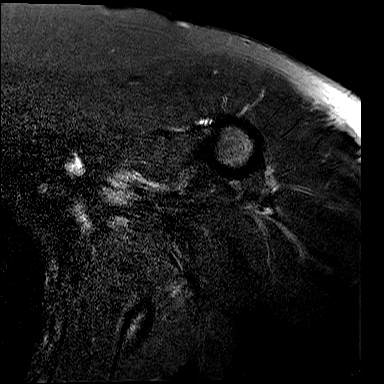
[im 8/22]
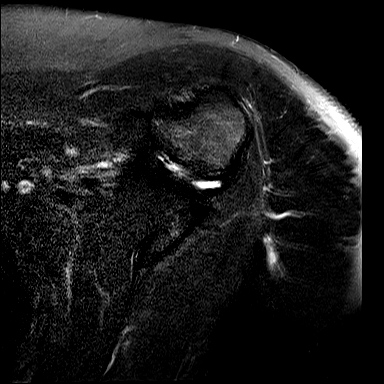
[im 11/22]
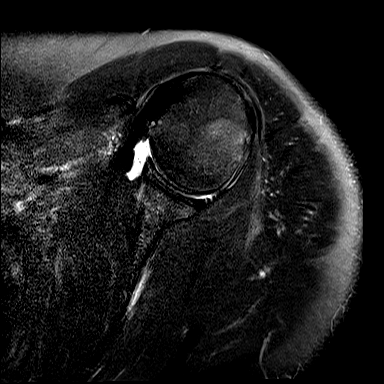
[im 15/22]
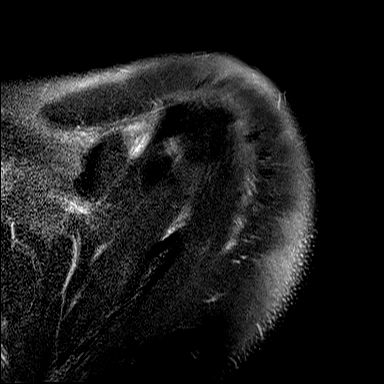
[im 18/22]
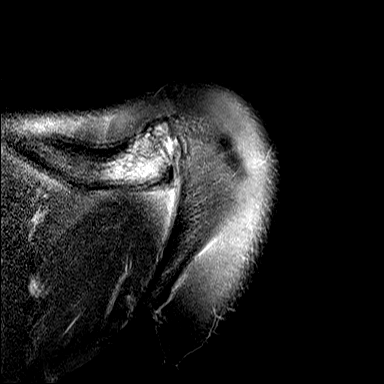
[im 22/22]
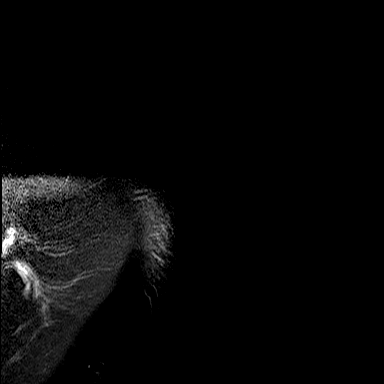

[Series 10: T2 fat-sat · oblique · left · 4.0mm · 0.44mm/px · 6 of 19 slices shown (2 of 4)]
[im 1/19]
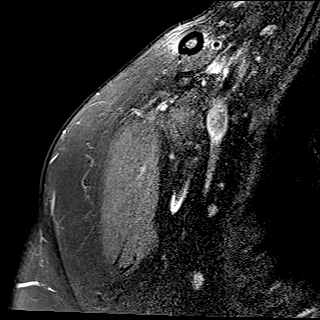
[im 4/19]
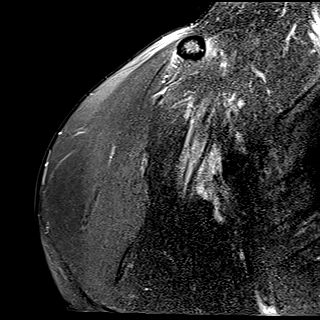
[im 8/19]
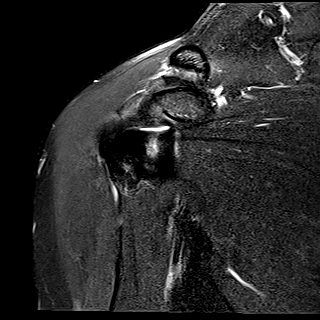
[im 11/19]
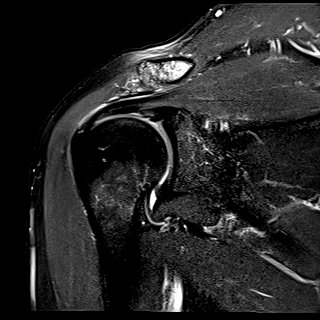
[im 15/19]
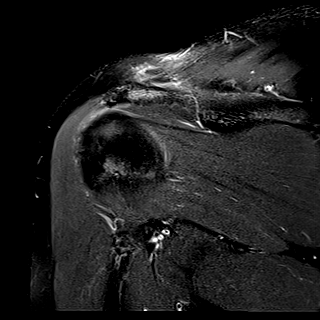
[im 19/19]
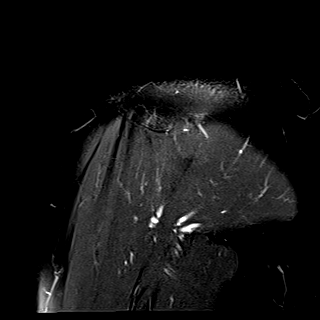

[Series 11: PD fat-sat · oblique · left · 4.0mm · 0.39mm/px · 6 of 20 slices shown]
[im 1/20]
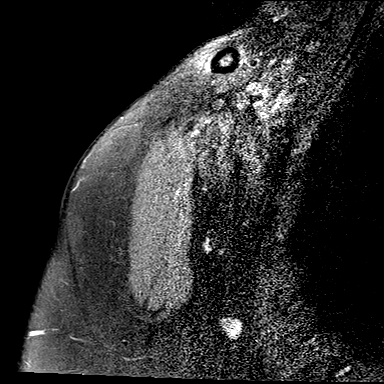
[im 4/20]
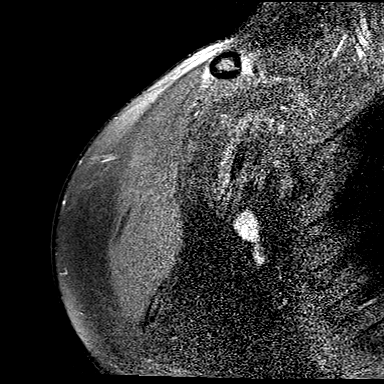
[im 8/20]
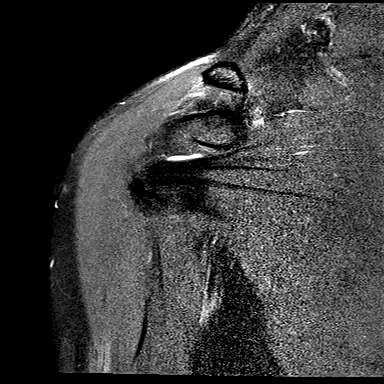
[im 12/20]
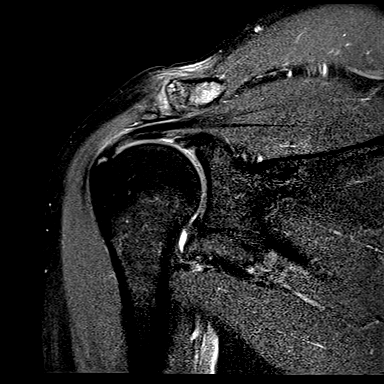
[im 16/20]
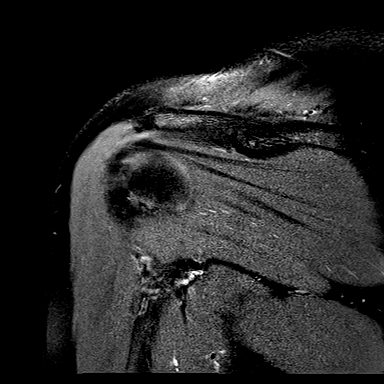
[im 20/20]
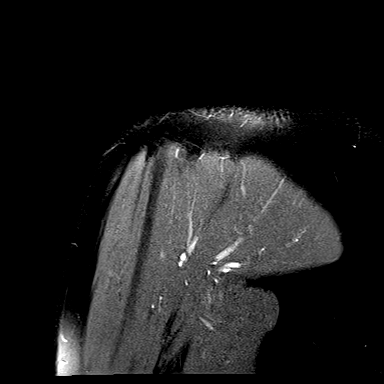

[Series 12: T2 fat-sat · coronal · left · 4.0mm · 0.44mm/px · 7 of 21 slices shown (3 of 4)]
[im 1/21]
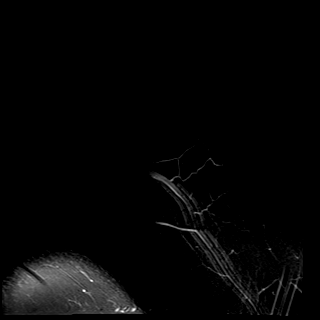
[im 4/21]
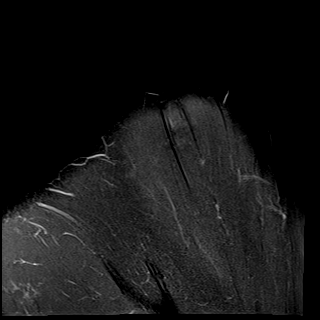
[im 7/21]
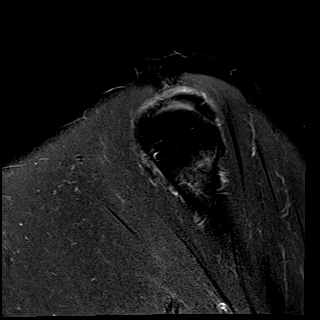
[im 11/21]
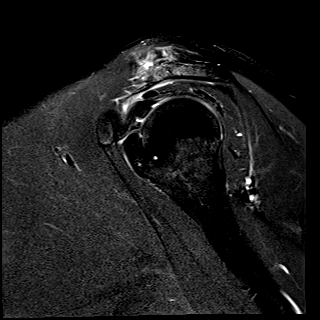
[im 14/21]
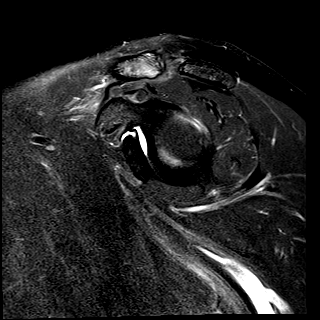
[im 17/21]
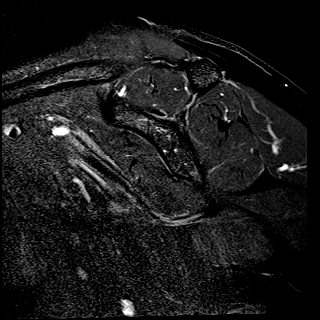
[im 21/21]
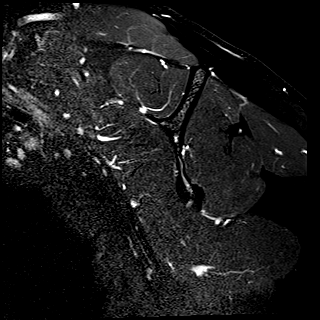

[Series 13: T1 · coronal · left · 4.0mm · 0.36mm/px · 4 of 21 slices shown]
[im 1/21]
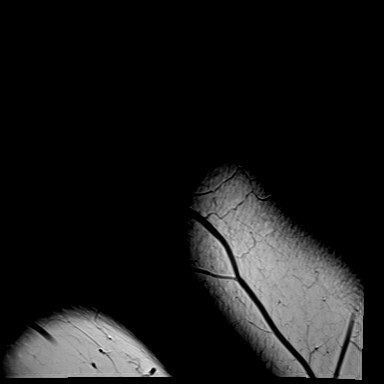
[im 4/21]
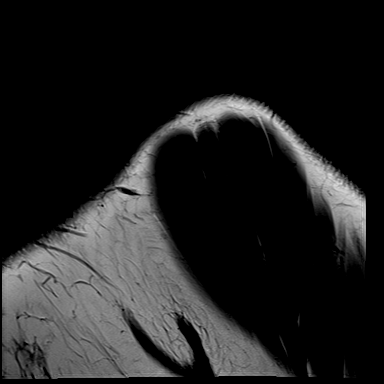
[im 7/21]
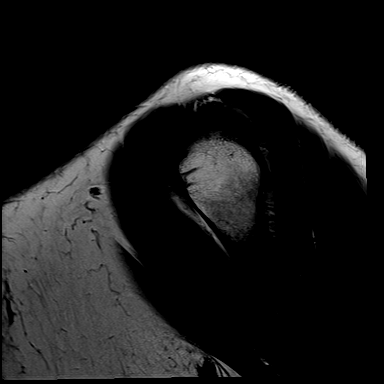
[im 11/21]
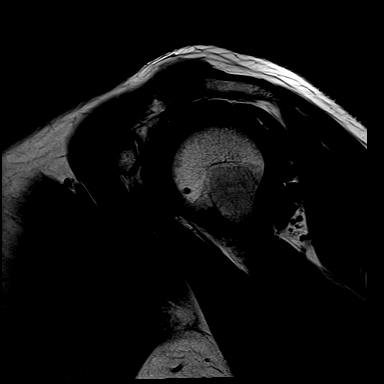

[Series 14: T2 fat-sat · axial · left · 4.0mm · 0.36mm/px · z∈[-115,-15]mm · 7 of 22 slices shown (4 of 4)]
[im 1/22]
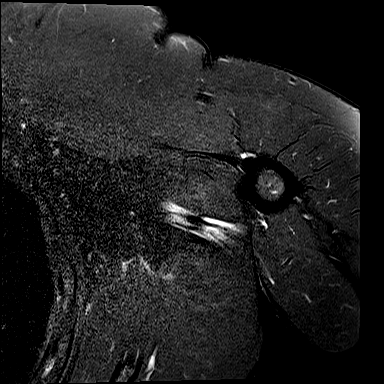
[im 4/22]
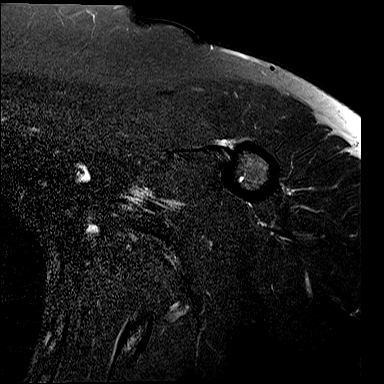
[im 8/22]
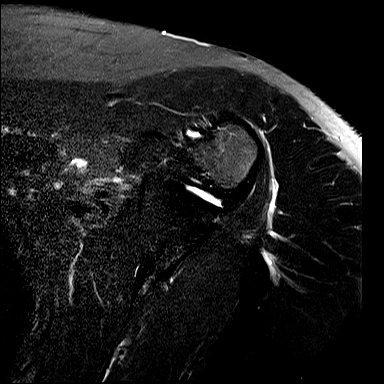
[im 11/22]
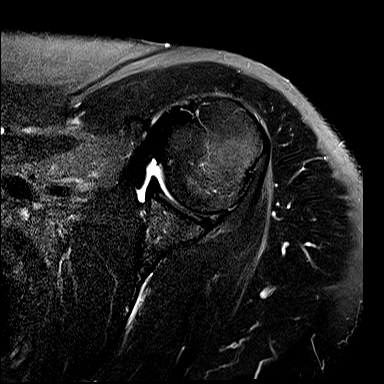
[im 15/22]
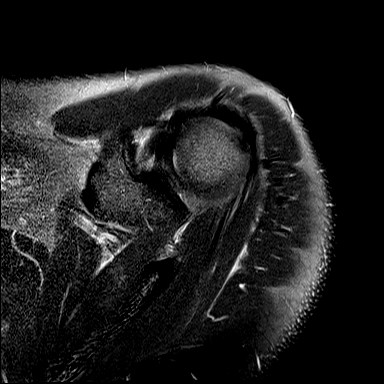
[im 18/22]
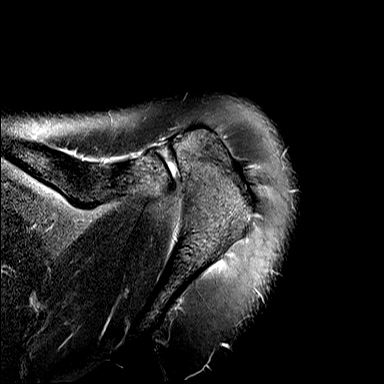
[im 22/22]
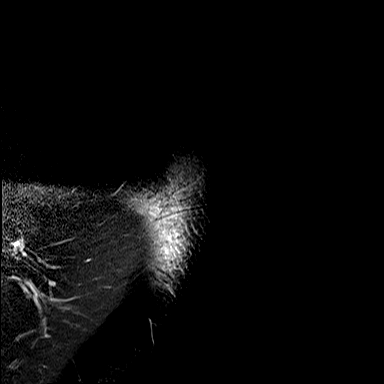

[37 of 40 positions shown; findings below may reference images not displayed]

FINDINGS: Despite efforts by the technologist and patient, mild motion
artifact is present on today's exam and could not be eliminated.
This reduces exam sensitivity and specificity. The axial images were
repeated.

Rotator cuff: Mild supraspinatus tendinosis with a small focus of
partial intrasubstance insertional tearing, best seen on the coronal
images. There is no full-thickness tendon tear or tendon retraction.
The infraspinatus, subscapularis and teres minor tendons appear
normal.

Muscles:  No focal muscular atrophy or edema.

Biceps long head: Intact and normally positioned. Mild tendinosis of
the intra-articular portion.

Acromioclavicular Joint: The acromion is type [DATE]. There are
moderate acromioclavicular degenerative changes. No significant
fluid in the subacromial-subdeltoid bursa.

Glenohumeral Joint: No significant shoulder joint effusion or
glenohumeral arthropathy.

Labrum:  No evidence of labral tear or paralabral cyst.

Bones: No acute or significant extra-articular osseous findings.

Other: No significant soft tissue findings.
IMPRESSION: 1. Mild supraspinatus tendinosis with small focus of partial
intrasubstance insertional tearing.
2. The additional components of the rotator cuff appear normal.
3. Moderate acromioclavicular degenerative changes.
4. The labrum and biceps tendon appear intact.

## 2022-01-03 ENCOUNTER — Other Ambulatory Visit (HOSPITAL_COMMUNITY): Payer: Self-pay

## 2022-01-04 ENCOUNTER — Other Ambulatory Visit (HOSPITAL_COMMUNITY): Payer: Self-pay

## 2022-01-04 DIAGNOSIS — J0100 Acute maxillary sinusitis, unspecified: Secondary | ICD-10-CM | POA: Diagnosis not present

## 2022-01-04 DIAGNOSIS — J309 Allergic rhinitis, unspecified: Secondary | ICD-10-CM | POA: Diagnosis not present

## 2022-01-04 MED ORDER — AMOXICILLIN 875 MG PO TABS
875.0000 mg | ORAL_TABLET | Freq: Two times a day (BID) | ORAL | 0 refills | Status: AC
  Filled 2022-01-04: qty 20, 10d supply, fill #0

## 2022-01-04 MED ORDER — PSEUDOEPH-BROMPHEN-DM 30-2-10 MG/5ML PO SYRP
10.0000 mL | ORAL_SOLUTION | Freq: Four times a day (QID) | ORAL | 0 refills | Status: AC | PRN
  Filled 2022-01-04: qty 60, 2d supply, fill #0
  Filled 2022-01-04: qty 120, 3d supply, fill #0

## 2022-01-05 ENCOUNTER — Other Ambulatory Visit (HOSPITAL_COMMUNITY): Payer: Self-pay

## 2022-01-07 ENCOUNTER — Other Ambulatory Visit (HOSPITAL_COMMUNITY): Payer: Self-pay

## 2022-01-07 MED ORDER — LEVOTHYROXINE SODIUM 50 MCG PO TABS
50.0000 ug | ORAL_TABLET | Freq: Every morning | ORAL | 4 refills | Status: DC
  Filled 2022-01-07: qty 90, 90d supply, fill #0
  Filled 2022-04-06: qty 90, 90d supply, fill #1
  Filled 2022-07-25: qty 90, 90d supply, fill #2
  Filled 2022-10-20: qty 90, 90d supply, fill #3

## 2022-01-07 MED ORDER — LEVOTHYROXINE SODIUM 50 MCG PO TABS: 50.0000 ug | ORAL_TABLET | Freq: Every morning | ORAL | 4 refills | Status: DC

## 2022-01-25 ENCOUNTER — Other Ambulatory Visit: Payer: Self-pay | Admitting: Endocrinology

## 2022-01-25 DIAGNOSIS — E049 Nontoxic goiter, unspecified: Secondary | ICD-10-CM

## 2022-02-03 ENCOUNTER — Other Ambulatory Visit (HOSPITAL_COMMUNITY): Payer: Self-pay

## 2022-03-02 ENCOUNTER — Ambulatory Visit
Admission: RE | Admit: 2022-03-02 | Discharge: 2022-03-02 | Disposition: A | Discharge status: Home / Self Care | Payer: 59 | Source: Ambulatory Visit | Attending: Endocrinology | Admitting: Endocrinology

## 2022-03-02 DIAGNOSIS — E039 Hypothyroidism, unspecified: Secondary | ICD-10-CM | POA: Diagnosis not present

## 2022-03-02 DIAGNOSIS — E7800 Pure hypercholesterolemia, unspecified: Secondary | ICD-10-CM | POA: Diagnosis not present

## 2022-03-02 DIAGNOSIS — R7301 Impaired fasting glucose: Secondary | ICD-10-CM | POA: Diagnosis not present

## 2022-03-02 DIAGNOSIS — E049 Nontoxic goiter, unspecified: Secondary | ICD-10-CM

## 2022-03-02 DIAGNOSIS — E042 Nontoxic multinodular goiter: Secondary | ICD-10-CM | POA: Diagnosis not present

## 2022-03-08 ENCOUNTER — Other Ambulatory Visit (HOSPITAL_COMMUNITY): Payer: Self-pay

## 2022-03-11 DIAGNOSIS — Z0000 Encounter for general adult medical examination without abnormal findings: Secondary | ICD-10-CM | POA: Diagnosis not present

## 2022-03-11 DIAGNOSIS — E7800 Pure hypercholesterolemia, unspecified: Secondary | ICD-10-CM | POA: Diagnosis not present

## 2022-03-11 DIAGNOSIS — G4700 Insomnia, unspecified: Secondary | ICD-10-CM | POA: Diagnosis not present

## 2022-03-11 DIAGNOSIS — E039 Hypothyroidism, unspecified: Secondary | ICD-10-CM | POA: Diagnosis not present

## 2022-03-29 DIAGNOSIS — E663 Overweight: Secondary | ICD-10-CM | POA: Diagnosis not present

## 2022-03-29 DIAGNOSIS — E7800 Pure hypercholesterolemia, unspecified: Secondary | ICD-10-CM | POA: Diagnosis not present

## 2022-03-29 DIAGNOSIS — E049 Nontoxic goiter, unspecified: Secondary | ICD-10-CM | POA: Diagnosis not present

## 2022-03-29 DIAGNOSIS — R7301 Impaired fasting glucose: Secondary | ICD-10-CM | POA: Diagnosis not present

## 2022-03-29 DIAGNOSIS — E039 Hypothyroidism, unspecified: Secondary | ICD-10-CM | POA: Diagnosis not present

## 2022-04-06 ENCOUNTER — Other Ambulatory Visit (HOSPITAL_COMMUNITY): Payer: Self-pay

## 2022-04-07 ENCOUNTER — Other Ambulatory Visit (HOSPITAL_COMMUNITY): Payer: Self-pay

## 2022-04-08 ENCOUNTER — Other Ambulatory Visit (HOSPITAL_COMMUNITY): Payer: Self-pay

## 2022-04-11 ENCOUNTER — Other Ambulatory Visit (HOSPITAL_COMMUNITY): Payer: Self-pay

## 2022-04-11 MED ORDER — ALPRAZOLAM 0.5 MG PO TABS
0.5000 mg | ORAL_TABLET | Freq: Every evening | ORAL | 2 refills | Status: DC | PRN
Start: 2022-04-10 — End: 2022-07-29
  Filled 2022-04-11: qty 30, 30d supply, fill #0
  Filled 2022-05-12 (×2): qty 30, 30d supply, fill #1
  Filled 2022-06-15: qty 30, 30d supply, fill #2

## 2022-04-14 DIAGNOSIS — M1712 Unilateral primary osteoarthritis, left knee: Secondary | ICD-10-CM | POA: Diagnosis not present

## 2022-04-14 DIAGNOSIS — M25562 Pain in left knee: Secondary | ICD-10-CM | POA: Diagnosis not present

## 2022-05-12 ENCOUNTER — Other Ambulatory Visit (HOSPITAL_COMMUNITY): Payer: Self-pay

## 2022-06-15 ENCOUNTER — Other Ambulatory Visit (HOSPITAL_COMMUNITY): Payer: Self-pay

## 2022-07-08 ENCOUNTER — Other Ambulatory Visit (HOSPITAL_COMMUNITY): Payer: Self-pay

## 2022-07-25 ENCOUNTER — Other Ambulatory Visit (HOSPITAL_COMMUNITY): Payer: Self-pay

## 2022-07-27 ENCOUNTER — Other Ambulatory Visit (HOSPITAL_COMMUNITY): Payer: Self-pay

## 2022-07-28 ENCOUNTER — Other Ambulatory Visit (HOSPITAL_COMMUNITY): Payer: Self-pay

## 2022-07-29 ENCOUNTER — Other Ambulatory Visit (HOSPITAL_COMMUNITY): Payer: Self-pay

## 2022-07-29 MED ORDER — ALPRAZOLAM 0.5 MG PO TABS
0.5000 mg | ORAL_TABLET | Freq: Every evening | ORAL | 2 refills | Status: DC | PRN
  Filled 2022-07-29: qty 30, 30d supply, fill #0
  Filled 2022-09-15: qty 30, 30d supply, fill #1
  Filled 2022-10-20: qty 30, 30d supply, fill #2

## 2022-08-29 ENCOUNTER — Other Ambulatory Visit: Payer: Self-pay | Admitting: Endocrinology

## 2022-08-29 DIAGNOSIS — E049 Nontoxic goiter, unspecified: Secondary | ICD-10-CM

## 2022-09-05 DIAGNOSIS — M1712 Unilateral primary osteoarthritis, left knee: Secondary | ICD-10-CM | POA: Diagnosis not present

## 2022-09-15 ENCOUNTER — Other Ambulatory Visit (HOSPITAL_COMMUNITY): Payer: Self-pay

## 2022-09-15 MED ORDER — CELECOXIB 200 MG PO CAPS
200.0000 mg | ORAL_CAPSULE | Freq: Every day | ORAL | 1 refills | Status: DC
  Filled 2022-09-15: qty 90, 90d supply, fill #0
  Filled 2022-12-08: qty 90, 90d supply, fill #1

## 2022-09-16 DIAGNOSIS — E7800 Pure hypercholesterolemia, unspecified: Secondary | ICD-10-CM | POA: Diagnosis not present

## 2022-09-16 DIAGNOSIS — F5104 Psychophysiologic insomnia: Secondary | ICD-10-CM | POA: Diagnosis not present

## 2022-09-16 DIAGNOSIS — E039 Hypothyroidism, unspecified: Secondary | ICD-10-CM | POA: Diagnosis not present

## 2022-09-18 DIAGNOSIS — M25562 Pain in left knee: Secondary | ICD-10-CM | POA: Diagnosis not present

## 2022-09-19 DIAGNOSIS — M25561 Pain in right knee: Secondary | ICD-10-CM | POA: Diagnosis not present

## 2022-09-19 DIAGNOSIS — M25562 Pain in left knee: Secondary | ICD-10-CM | POA: Diagnosis not present

## 2022-09-20 DIAGNOSIS — H52203 Unspecified astigmatism, bilateral: Secondary | ICD-10-CM | POA: Diagnosis not present

## 2022-09-20 DIAGNOSIS — H2513 Age-related nuclear cataract, bilateral: Secondary | ICD-10-CM | POA: Diagnosis not present

## 2022-09-22 DIAGNOSIS — M2242 Chondromalacia patellae, left knee: Secondary | ICD-10-CM | POA: Diagnosis not present

## 2022-09-22 DIAGNOSIS — S83232A Complex tear of medial meniscus, current injury, left knee, initial encounter: Secondary | ICD-10-CM | POA: Diagnosis not present

## 2022-09-26 DIAGNOSIS — M25562 Pain in left knee: Secondary | ICD-10-CM | POA: Diagnosis not present

## 2022-09-26 DIAGNOSIS — M25561 Pain in right knee: Secondary | ICD-10-CM | POA: Diagnosis not present

## 2022-10-03 DIAGNOSIS — M25561 Pain in right knee: Secondary | ICD-10-CM | POA: Diagnosis not present

## 2022-10-03 DIAGNOSIS — M25562 Pain in left knee: Secondary | ICD-10-CM | POA: Diagnosis not present

## 2022-10-12 DIAGNOSIS — M25561 Pain in right knee: Secondary | ICD-10-CM | POA: Diagnosis not present

## 2022-10-12 DIAGNOSIS — M25562 Pain in left knee: Secondary | ICD-10-CM | POA: Diagnosis not present

## 2022-10-17 ENCOUNTER — Ambulatory Visit
Admission: RE | Admit: 2022-10-17 | Discharge: 2022-10-17 | Disposition: A | Discharge status: Home / Self Care | Payer: Commercial Managed Care - PPO | Source: Ambulatory Visit | Attending: Endocrinology | Admitting: Endocrinology

## 2022-10-17 DIAGNOSIS — E042 Nontoxic multinodular goiter: Secondary | ICD-10-CM | POA: Diagnosis not present

## 2022-10-17 DIAGNOSIS — E049 Nontoxic goiter, unspecified: Secondary | ICD-10-CM

## 2022-10-20 ENCOUNTER — Other Ambulatory Visit (HOSPITAL_COMMUNITY): Payer: Self-pay

## 2022-10-21 ENCOUNTER — Other Ambulatory Visit: Payer: Self-pay | Admitting: Endocrinology

## 2022-10-21 DIAGNOSIS — E049 Nontoxic goiter, unspecified: Secondary | ICD-10-CM

## 2022-11-10 DIAGNOSIS — Z1231 Encounter for screening mammogram for malignant neoplasm of breast: Secondary | ICD-10-CM | POA: Diagnosis not present

## 2022-11-24 ENCOUNTER — Ambulatory Visit
Admission: RE | Admit: 2022-11-24 | Discharge: 2022-11-24 | Disposition: A | Discharge status: Home / Self Care | Payer: Commercial Managed Care - PPO | Source: Ambulatory Visit | Attending: Endocrinology | Admitting: Endocrinology

## 2022-11-24 ENCOUNTER — Other Ambulatory Visit (HOSPITAL_COMMUNITY)
Admission: RE | Admit: 2022-11-24 | Discharge: 2022-11-24 | Disposition: A | Discharge status: Home / Self Care | Payer: Commercial Managed Care - PPO | Source: Ambulatory Visit | Attending: Endocrinology | Admitting: Endocrinology

## 2022-11-24 DIAGNOSIS — E049 Nontoxic goiter, unspecified: Secondary | ICD-10-CM

## 2022-11-24 DIAGNOSIS — E041 Nontoxic single thyroid nodule: Secondary | ICD-10-CM | POA: Diagnosis not present

## 2022-11-28 LAB — CYTOLOGY - NON PAP

## 2022-12-08 ENCOUNTER — Other Ambulatory Visit (HOSPITAL_COMMUNITY): Payer: Self-pay

## 2022-12-08 MED ORDER — ALPRAZOLAM 0.5 MG PO TABS
0.5000 mg | ORAL_TABLET | Freq: Every evening | ORAL | 2 refills | Status: DC | PRN
  Filled 2022-12-08: qty 30, 30d supply, fill #0
  Filled 2023-01-05 – 2023-01-06 (×2): qty 30, 30d supply, fill #1
  Filled 2023-03-03: qty 30, 30d supply, fill #2

## 2022-12-16 DIAGNOSIS — M79671 Pain in right foot: Secondary | ICD-10-CM | POA: Diagnosis not present

## 2023-01-02 DIAGNOSIS — M79671 Pain in right foot: Secondary | ICD-10-CM | POA: Diagnosis not present

## 2023-01-05 ENCOUNTER — Other Ambulatory Visit (HOSPITAL_COMMUNITY): Payer: Self-pay

## 2023-01-05 DIAGNOSIS — Z780 Asymptomatic menopausal state: Secondary | ICD-10-CM | POA: Diagnosis not present

## 2023-01-05 DIAGNOSIS — Z90710 Acquired absence of both cervix and uterus: Secondary | ICD-10-CM | POA: Diagnosis not present

## 2023-01-05 DIAGNOSIS — Z01419 Encounter for gynecological examination (general) (routine) without abnormal findings: Secondary | ICD-10-CM | POA: Diagnosis not present

## 2023-01-05 MED ORDER — ESTRADIOL 0.1 MG/24HR TD PTTW
1.0000 | MEDICATED_PATCH | TRANSDERMAL | 4 refills | Status: DC
  Filled 2023-01-05: qty 24, 84d supply, fill #0
  Filled 2023-04-07: qty 24, 84d supply, fill #1
  Filled 2023-07-07: qty 24, 84d supply, fill #2
  Filled 2024-01-03: qty 24, 84d supply, fill #3

## 2023-01-06 ENCOUNTER — Other Ambulatory Visit (HOSPITAL_COMMUNITY): Payer: Self-pay

## 2023-01-06 ENCOUNTER — Other Ambulatory Visit: Payer: Self-pay

## 2023-01-10 ENCOUNTER — Other Ambulatory Visit (HOSPITAL_COMMUNITY): Payer: Self-pay

## 2023-01-10 DIAGNOSIS — G5761 Lesion of plantar nerve, right lower limb: Secondary | ICD-10-CM | POA: Diagnosis not present

## 2023-01-10 MED ORDER — MELOXICAM 7.5 MG PO TABS
7.5000 mg | ORAL_TABLET | Freq: Two times a day (BID) | ORAL | 0 refills | Status: AC
  Filled 2023-01-10: qty 60, 30d supply, fill #0

## 2023-01-30 ENCOUNTER — Other Ambulatory Visit (HOSPITAL_COMMUNITY): Payer: Self-pay

## 2023-01-30 DIAGNOSIS — L03119 Cellulitis of unspecified part of limb: Secondary | ICD-10-CM | POA: Diagnosis not present

## 2023-01-30 MED ORDER — LEVOTHYROXINE SODIUM 50 MCG PO TABS
50.0000 ug | ORAL_TABLET | Freq: Every morning | ORAL | 4 refills | Status: DC
  Filled 2023-01-30: qty 90, 90d supply, fill #0
  Filled 2023-05-04: qty 90, 90d supply, fill #1
  Filled 2023-08-09: qty 90, 90d supply, fill #2
  Filled 2023-11-20: qty 90, 90d supply, fill #3

## 2023-01-30 MED ORDER — CEPHALEXIN 500 MG PO CAPS
500.0000 mg | ORAL_CAPSULE | Freq: Four times a day (QID) | ORAL | 0 refills | Status: DC
  Filled 2023-01-30: qty 20, 5d supply, fill #0

## 2023-03-02 ENCOUNTER — Other Ambulatory Visit (HOSPITAL_COMMUNITY): Payer: Self-pay

## 2023-03-02 DIAGNOSIS — M79605 Pain in left leg: Secondary | ICD-10-CM | POA: Diagnosis not present

## 2023-03-02 MED ORDER — DOXYCYCLINE HYCLATE 100 MG PO CAPS
100.0000 mg | ORAL_CAPSULE | Freq: Two times a day (BID) | ORAL | 0 refills | Status: DC
  Filled 2023-03-02: qty 20, 10d supply, fill #0

## 2023-03-03 ENCOUNTER — Other Ambulatory Visit (HOSPITAL_COMMUNITY): Payer: Self-pay

## 2023-03-23 ENCOUNTER — Other Ambulatory Visit: Payer: Self-pay | Admitting: *Deleted

## 2023-03-23 DIAGNOSIS — M79606 Pain in leg, unspecified: Secondary | ICD-10-CM

## 2023-04-04 ENCOUNTER — Other Ambulatory Visit (HOSPITAL_COMMUNITY): Payer: Self-pay

## 2023-04-07 ENCOUNTER — Other Ambulatory Visit (HOSPITAL_COMMUNITY): Payer: Self-pay

## 2023-04-07 DIAGNOSIS — Z0000 Encounter for general adult medical examination without abnormal findings: Secondary | ICD-10-CM | POA: Diagnosis not present

## 2023-04-07 DIAGNOSIS — E039 Hypothyroidism, unspecified: Secondary | ICD-10-CM | POA: Diagnosis not present

## 2023-04-07 DIAGNOSIS — E7800 Pure hypercholesterolemia, unspecified: Secondary | ICD-10-CM | POA: Diagnosis not present

## 2023-04-07 DIAGNOSIS — J309 Allergic rhinitis, unspecified: Secondary | ICD-10-CM | POA: Diagnosis not present

## 2023-04-07 DIAGNOSIS — Z1211 Encounter for screening for malignant neoplasm of colon: Secondary | ICD-10-CM | POA: Diagnosis not present

## 2023-04-07 DIAGNOSIS — E2839 Other primary ovarian failure: Secondary | ICD-10-CM | POA: Diagnosis not present

## 2023-04-07 MED ORDER — CELECOXIB 200 MG PO CAPS
200.0000 mg | ORAL_CAPSULE | Freq: Every day | ORAL | 1 refills | Status: DC
  Filled 2023-04-07: qty 90, 90d supply, fill #0
  Filled 2023-07-07: qty 90, 90d supply, fill #1

## 2023-04-10 ENCOUNTER — Other Ambulatory Visit (HOSPITAL_COMMUNITY): Payer: Self-pay

## 2023-04-10 DIAGNOSIS — R7301 Impaired fasting glucose: Secondary | ICD-10-CM | POA: Diagnosis not present

## 2023-04-10 DIAGNOSIS — E039 Hypothyroidism, unspecified: Secondary | ICD-10-CM | POA: Diagnosis not present

## 2023-04-10 DIAGNOSIS — E7800 Pure hypercholesterolemia, unspecified: Secondary | ICD-10-CM | POA: Diagnosis not present

## 2023-04-11 ENCOUNTER — Other Ambulatory Visit (HOSPITAL_COMMUNITY): Payer: Self-pay

## 2023-04-14 ENCOUNTER — Other Ambulatory Visit (HOSPITAL_COMMUNITY): Payer: Self-pay

## 2023-04-14 ENCOUNTER — Other Ambulatory Visit: Payer: Self-pay

## 2023-04-14 MED ORDER — ALPRAZOLAM 0.5 MG PO TABS
0.5000 mg | ORAL_TABLET | Freq: Every evening | ORAL | 2 refills | Status: AC | PRN
Start: 2023-04-14 — End: ?
  Filled 2023-04-14: qty 30, 30d supply, fill #0
  Filled 2023-07-07: qty 30, 30d supply, fill #1
  Filled 2023-08-09: qty 30, 30d supply, fill #2

## 2023-04-19 ENCOUNTER — Encounter: Payer: Commercial Managed Care - PPO | Admitting: Vascular Surgery

## 2023-04-19 ENCOUNTER — Encounter (HOSPITAL_COMMUNITY): Payer: Commercial Managed Care - PPO

## 2023-04-21 ENCOUNTER — Other Ambulatory Visit (HOSPITAL_COMMUNITY): Payer: Self-pay | Admitting: Endocrinology

## 2023-04-21 DIAGNOSIS — E039 Hypothyroidism, unspecified: Secondary | ICD-10-CM | POA: Diagnosis not present

## 2023-04-21 DIAGNOSIS — E7800 Pure hypercholesterolemia, unspecified: Secondary | ICD-10-CM

## 2023-04-21 DIAGNOSIS — R7301 Impaired fasting glucose: Secondary | ICD-10-CM | POA: Diagnosis not present

## 2023-04-21 DIAGNOSIS — E663 Overweight: Secondary | ICD-10-CM | POA: Diagnosis not present

## 2023-04-21 DIAGNOSIS — E049 Nontoxic goiter, unspecified: Secondary | ICD-10-CM | POA: Diagnosis not present

## 2023-04-26 ENCOUNTER — Encounter: Payer: Self-pay | Admitting: Vascular Surgery

## 2023-04-26 ENCOUNTER — Ambulatory Visit (HOSPITAL_COMMUNITY)
Admission: RE | Admit: 2023-04-26 | Discharge: 2023-04-26 | Disposition: A | Discharge status: Home / Self Care | Payer: Commercial Managed Care - PPO | Source: Ambulatory Visit | Attending: Endocrinology | Admitting: Endocrinology

## 2023-04-26 ENCOUNTER — Ambulatory Visit (HOSPITAL_COMMUNITY)
Admission: RE | Admit: 2023-04-26 | Discharge: 2023-04-26 | Disposition: A | Discharge status: Home / Self Care | Payer: Commercial Managed Care - PPO | Source: Ambulatory Visit | Attending: Vascular Surgery | Admitting: Vascular Surgery

## 2023-04-26 ENCOUNTER — Ambulatory Visit (INDEPENDENT_AMBULATORY_CARE_PROVIDER_SITE_OTHER): Payer: Commercial Managed Care - PPO | Admitting: Vascular Surgery

## 2023-04-26 VITALS — BP 113/74 | HR 73 | Temp 98.4°F | Resp 20 | Ht 64.0 in | Wt 150.9 lb

## 2023-04-26 DIAGNOSIS — E7800 Pure hypercholesterolemia, unspecified: Secondary | ICD-10-CM

## 2023-04-26 DIAGNOSIS — M79606 Pain in leg, unspecified: Secondary | ICD-10-CM | POA: Insufficient documentation

## 2023-04-26 DIAGNOSIS — M25562 Pain in left knee: Secondary | ICD-10-CM

## 2023-04-26 LAB — VAS US ABI WITH/WO TBI
Left ABI: 0.98
Right ABI: 1.33

## 2023-04-26 NOTE — Progress Notes (Signed)
Patient ID: Erin Hall, female   DOB: 25-May-1961, 62 y.o.   MRN: [id number]  Reason for Consult: New Patient (Initial Visit)   Referred by Catha Gosselin, MD  Subjective:     HPI:  Erin Hall is a 62 y.o. female without a history of vascular disease.  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  She does not notice any real swelling.  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  She also follows with orthopedic surgery for knee pain.  She does not have any tissue loss or ulceration.  More recently she has noted some spider veins on the left medial thigh and leg.  Her father had varicosities.  She has never had DVT or lower extremity intervention.  She does wear compression stockings to work daily.  She denies cramping with ambulation.  Past Medical History:  Diagnosis Date   DJD (degenerative joint disease)    Family history of breast cancer    Family history of colon cancer    History of cellulitis    DOG BITE RIGHT THIGH 2002   History of colon polyps    History of positive PPD    History of seasonal allergies    History of uterine fibroid    Hyperlipidemia    hx of   Hypothyroidism    Multinodular goiter    followed by endocrinologist Dr Talmage Nap, on suppressive supplement therapy   Right knee meniscal tear    Family History  Problem Relation Age of Onset   Breast cancer Mother 63   Colon cancer Father        dx in his 27s, recurrance at age 72   Colon cancer Paternal Grandfather        d. in his 23s   Colon cancer Paternal Aunt        dx in 57s; d. in 44s   Colon cancer Paternal Uncle        dx in 105s; d. in 16s   Colon cancer Paternal Uncle        dx in 31s, d. in 41s   Colon cancer Paternal Uncle        dx in 80s, d. in 89s   Colon cancer Cousin        pat first cousin   Skin cancer Sister        dx in her 16s   Colon cancer Brother        dx in  his 63s; pat 1/2 brother   Heart attack Maternal Grandmother    Heart failure Maternal Grandfather    Colon cancer Cousin        pat first cousin   Colon cancer Other        PGFs brother   Colon cancer Cousin        father's multiple paternal first cousins   Past Surgical History:  Procedure Laterality Date   BILATERAL SALPINGECTOMY N/A 12/12/2013   Procedure: BILATERAL SALPINGECTOMY;  Surgeon: Serita Kyle, MD;  Location: [facility];  Service: Gynecology;  Laterality: N/A;   BREAST REDUCTION SURGERY Bilateral 02-08-2007   COLONOSCOPY     I & D RIGHT THIGH ABSCESS  12-04-2000   KNEE ARTHROSCOPY Left several -- last one 2008   KNEE ARTHROSCOPY Right 08/20/2015   Procedure: ARTHROSCOPY RIGHT KNEE/ MEDIAL AND LATERAL PARTIAL MENISCECTOMY/ MEDIAL AND LATERAL PARTIAL  CHONDROPLASTY/ BILATERAL TROCH INJECTION;  Surgeon: Durene Romans, MD;  Location: [facility];  Service: Orthopedics;  Laterality: Right;  AND BILATERAL HIPS   KNEE SURGERY Left age 12   osteomalacia   ROBOTIC ASSISTED TOTAL HYSTERECTOMY N/A 12/12/2013   Procedure: ROBOTIC ASSISTED TOTAL HYSTERECTOMY;  Surgeon: Serita Kyle, MD;  Location: [facility];  Service: Gynecology;  Laterality: N/A;   SHOULDER ARTHROSCOPY WITH ROTATOR CUFF REPAIR AND SUBACROMIAL DECOMPRESSION Right 01/25/2018   Procedure: RIGHT SHOULDER ARTHROSCOPY WITH ROTATOR CUFF REPAIR AND SUBACROMIAL DECOMPRESSION, DISTAL CLAVICLE RESECTION;  Surgeon: Francena Hanly, MD;  Location: [facility];  Service: Orthopedics;  Laterality: Right;   TONSILLECTOMY     UTERINE ARTERY EMBOLIZATION  04/17/2012   WISDOM TOOTH EXTRACTION      Short Social History:  Social History   Tobacco Use   Smoking status: Never   Smokeless tobacco: Never  Substance Use Topics   Alcohol use: No    No Known Allergies  Current Outpatient Medications  Medication Sig Dispense Refill   ALPRAZolam (XANAX) 0.5 MG tablet Take 1 tablet (0.5 mg total) by mouth at bedtime as  needed for sleep (Do not fill early) 30 tablet 2   celecoxib (CELEBREX) 200 MG capsule Take 1 capsule (200 mg total) by mouth daily. Need to take pepcid (famotidine)daily with this to prevent GI issues 90 capsule 1   atorvastatin (LIPITOR) 10 MG tablet Take 1 tablet (10 mg total) by mouth daily. (Patient not taking: Reported on 04/26/2023) 90 tablet 3   Biotin w/ Vitamins C & E (HAIR/SKIN/NAILS PO) Take 1 tablet by mouth daily.     cetirizine (ZYRTEC) 10 MG tablet Take 10 mg by mouth every evening.     Cholecalciferol (VITAMIN D3) 5000 units CAPS Take 10,000 Units by mouth daily.     cyclobenzaprine (FLEXERIL) 10 MG tablet Take 1 tablet (10 mg total) by mouth 3 (three) times daily as needed for muscle spasms. 30 tablet 0   doxycycline (VIBRAMYCIN) 100 MG capsule Take 1 capsule (100 mg total) by mouth 2 (two) times daily for 10 days. 20 capsule 0   estradiol (VIVELLE-DOT) 0.1 MG/24HR patch APPLY 1 PATCH ONTO THE SKIN TWICE A WEEK 24 patch 4   estradiol (VIVELLE-DOT) 0.1 MG/24HR patch Place 1 patch (0.1 mg total) onto the skin 2 (two) times a week. 24 patch 4   fish oil-omega-3 fatty acids 1000 MG capsule Take 1 g by mouth every evening.     levothyroxine (SYNTHROID) 50 MCG tablet Take 1 tablet (50 mcg total) by mouth every morning on an empty stomach 90 tablet 4   levothyroxine (SYNTHROID) 50 MCG tablet Take 1 tablet (50 mcg total) by mouth in the morning on an emtpy stomach 90 tablet 4   levothyroxine (SYNTHROID, LEVOTHROID) 50 MCG tablet Take 50 mcg by mouth daily before breakfast.   12   magnesium oxide (MAG-OX) 400 MG tablet Take 400 mg by mouth daily.     meloxicam (MOBIC) 7.5 MG tablet Take 1 tablet (7.5 mg total) by mouth 2 (two) times daily for 2 weeks, then as needed 60 tablet 0   MINIVELLE 0.1 MG/24HR patch Place 1 patch onto the skin 2 (two) times a week. Wednesdays and Saturdays.  2   Multiple Vitamin (MULTIVITAMIN WITH MINERALS) TABS Take 1 tablet by mouth every evening.     ondansetron  (ZOFRAN) 4 MG tablet Take 1 tablet (4 mg total) by mouth every 8 (eight) hours as needed for nausea or vomiting.  10 tablet 0   oxyCODONE-acetaminophen (PERCOCET) 5-325 MG tablet Take 1 tablet by mouth every 4 (four) hours as needed (max 6 q). 30 tablet 0   vitamin C (ASCORBIC ACID) 500 MG tablet Take 500 mg by mouth every evening.     No current facility-administered medications for this visit.    Review of Systems  Constitutional:  Constitutional negative. HENT: HENT negative.  Eyes: Eyes negative.  Respiratory: Respiratory negative.  Cardiovascular: Positive for leg swelling. Negative for claudication.  GI: Gastrointestinal negative.  Musculoskeletal: Positive for leg pain.  Neurological: Neurological negative. Hematologic: Hematologic/lymphatic negative.  Psychiatric: Psychiatric negative.        Objective:  Objective  Vitals:   04/26/23 0826  BP: 113/74  Pulse: 73  Resp: 20  Temp: 98.4 F (36.9 C)  SpO2: 98%     Physical Exam HENT:     Head: Normocephalic.     Nose: Nose normal.  Eyes:     Pupils: Pupils are equal, round, and reactive to light.  Cardiovascular:     Rate and Rhythm: Normal rate.     Pulses:          Dorsalis pedis pulses are 2+ on the right side and 1+ on the left side.  Pulmonary:     Effort: Pulmonary effort is normal.  Abdominal:     General: Abdomen is flat.  Musculoskeletal:        General: Normal range of motion.     Cervical back: Normal range of motion.     Right lower leg: No edema.     Left lower leg: No edema.     Comments: Minimal reticular veins left medial thigh and medial leg  Skin:    General: Skin is warm.     Capillary Refill: Capillary refill takes less than 2 seconds.  Neurological:     General: No focal deficit present.     Mental Status: She is alert.  Psychiatric:        Mood and Affect: Mood normal.        Thought Content: Thought content normal.        Judgment: Judgment normal.     Data: ABI Findings:   +---------+------------------+-----+---------+--------+  Right   Rt Pressure (mmHg)IndexWaveform Comment   +---------+------------------+-----+---------+--------+  Brachial 120                                       +---------+------------------+-----+---------+--------+  PTA     149               1.21 triphasic          +---------+------------------+-----+---------+--------+  DP      164               1.33 triphasic          +---------+------------------+-----+---------+--------+  Great Toe116               0.94 Normal             +---------+------------------+-----+---------+--------+   +---------+------------------+-----+--------+-------+  Left    Lt Pressure (mmHg)IndexWaveformComment  +---------+------------------+-----+--------+-------+  Brachial 123                                     +---------+------------------+-----+--------+-------+  PTA     119  0.97 biphasic         +---------+------------------+-----+--------+-------+  DP      121               0.98 biphasic         +---------+------------------+-----+--------+-------+  Great Toe98                0.80 Normal           +---------+------------------+-----+--------+-------+   +-------+-----------+-----------+------------+------------+  ABI/TBIToday's ABIToday's TBIPrevious ABIPrevious TBI  +-------+-----------+-----------+------------+------------+  Right 1.33       0.94                                 +-------+-----------+-----------+------------+------------+  Left  0.98       0.80                                 +-------+-----------+-----------+------------+------------+        Summary:  Right: Resting right ankle-brachial index is within normal range. The  right toe-brachial index is normal.   Left: Resting left ankle-brachial index is within normal range. The left  toe-brachial index is normal.       Assessment/Plan:     62 year old female with left lower extremity pain does not appear to be vascular in nature.  There may be a venous component but given that she has only minimal reticular veins of the left medial thigh and leg I would not recommend any venous evaluation unless her symptoms worsen.  I recommended continued compression stockings and activity as tolerated she can see me on an as-needed basis.      Maeola Harman MD [facility]

## 2023-05-04 ENCOUNTER — Other Ambulatory Visit (HOSPITAL_COMMUNITY): Payer: Self-pay

## 2023-05-19 ENCOUNTER — Other Ambulatory Visit: Payer: Self-pay | Admitting: Endocrinology

## 2023-05-19 DIAGNOSIS — E049 Nontoxic goiter, unspecified: Secondary | ICD-10-CM

## 2023-07-07 ENCOUNTER — Other Ambulatory Visit (HOSPITAL_COMMUNITY): Payer: Self-pay

## 2023-08-09 ENCOUNTER — Other Ambulatory Visit (HOSPITAL_COMMUNITY): Payer: Self-pay

## 2023-08-09 ENCOUNTER — Other Ambulatory Visit (HOSPITAL_BASED_OUTPATIENT_CLINIC_OR_DEPARTMENT_OTHER): Payer: Self-pay

## 2023-08-17 ENCOUNTER — Other Ambulatory Visit: Payer: Self-pay

## 2023-08-17 ENCOUNTER — Other Ambulatory Visit (HOSPITAL_COMMUNITY): Payer: Self-pay

## 2023-09-01 ENCOUNTER — Other Ambulatory Visit (HOSPITAL_COMMUNITY): Payer: Self-pay

## 2023-09-04 ENCOUNTER — Other Ambulatory Visit (HOSPITAL_COMMUNITY): Payer: Self-pay

## 2023-09-05 ENCOUNTER — Other Ambulatory Visit (HOSPITAL_COMMUNITY): Payer: Self-pay

## 2023-09-06 ENCOUNTER — Other Ambulatory Visit (HOSPITAL_COMMUNITY): Payer: Self-pay

## 2023-09-06 MED ORDER — ALPRAZOLAM 0.5 MG PO TABS
0.5000 mg | ORAL_TABLET | Freq: Every evening | ORAL | 2 refills | Status: AC
  Filled 2023-09-06: qty 30, 30d supply, fill #0
  Filled 2023-10-26: qty 30, 30d supply, fill #1
  Filled 2023-11-20 – 2023-11-24 (×2): qty 30, 30d supply, fill #2

## 2023-09-25 ENCOUNTER — Other Ambulatory Visit (HOSPITAL_COMMUNITY): Payer: Self-pay

## 2023-09-26 ENCOUNTER — Encounter: Payer: Self-pay | Admitting: Dermatology

## 2023-09-26 ENCOUNTER — Ambulatory Visit: Payer: Commercial Managed Care - PPO | Admitting: Dermatology

## 2023-09-26 VITALS — BP 100/68 | HR 77

## 2023-09-26 DIAGNOSIS — L658 Other specified nonscarring hair loss: Secondary | ICD-10-CM

## 2023-09-26 DIAGNOSIS — L6681 Central centrifugal cicatricial alopecia: Secondary | ICD-10-CM | POA: Diagnosis not present

## 2023-09-26 MED ORDER — SAFETY SEAL MISCELLANEOUS MISC: 2 refills | Status: AC

## 2023-09-26 NOTE — Progress Notes (Signed)
   New Patient Visit   Subjective  Erin Hall is a 63 y.o. female who presents for the following: New Pt - Hair Loss/Thinning   Patient states she has hair loss located at the scalp that she would like to have examined. Patient reports the areas have been there for 1 year. She reports the areas are not bothersome.Patient rates irritation 0 out of 10. She states that the areas have spread. Patient reports she has not previously been treated for these areas. Patient denies Hx of bx. Patient denies family history of skin cancer(s).  The patient has spots, moles and lesions to be evaluated, some may be new or changing and the patient may have concern these could be cancer.   The following portions of the chart were reviewed this encounter and updated as appropriate: medications, allergies, medical history  Review of Systems:  No other skin or systemic complaints except as noted in HPI or Assessment and Plan.  Objective  Well appearing patient in no apparent distress; mood and affect are within normal limits.   A focused examination was performed of the following areas: scalp & hair   Relevant exam findings are noted in the Assessment and Plan.                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 Clinical examination reveals traction alopecia and CCCA, with scarring and permanent follicle loss in some areas, while others retain viable follicles. The condition is attributed to genetic predisposition in women of color, with certain hair care practices potentially accelerating the progression. Patient has discontinued chemical relaxers for approximately five years.  Plan:   Prescribe compound with topical clobetasol and 8% minoxidil , to be applied every morning.   Recommend Vivescal supplements, 2 tablets daily.   Suggest Vital  Proteins collagen powder as a dietary supplement.   Advise on gentle hair care practices:     Avoid tight hairstyles and repetitive styling.     Limit heat styling to below 400F (ideally 350-375F) once weekly.     Use heat protectant spray before heat styling.   Recommend an anti-inflammatory diet rich in green leafy vegetables and deeply pigmented fruits.  TRACTION ALOPECIA   Related Medications Safety Seal Miscellaneous MISC AA Gel with Minoxidil  USP 10% and Clobetasol USP 0.05% - use on affected areas daily every morning. CENTRAL CENTRIFUGAL CICATRICIAL ALOPECIA   Related Medications Safety Seal Miscellaneous MISC AA Gel with Minoxidil  USP 10% and Clobetasol USP 0.05% - use on affected areas daily every morning.  Return in about 4 months (around 01/24/2024) for CCCA & traction alopecia.    Documentation: I have reviewed the above documentation for accuracy and completeness, and I agree with the above.   I, Shirron Maranda, CMA, am acting as scribe for [employer], DO.   Delon Lenis, DO

## 2023-09-26 NOTE — Patient Instructions (Addendum)
 Hello Elektra,  Thank you for visiting my office today.   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 This regimen is aimed at reducing inflammation and promoting hair growth.   Expectation: Significant results should be noticeable in about 4 months.  Supplements:   Viviscal: Take 2 tablets daily to support hair growth.   Vital Proteins Collagen Powder: Consider adding this to your coffee or smoothies for additional benefits.  Hair Care Products: Opt for gentle, hydrating shampoo and conditioner. The SEEN brand is recommended due to its dermatologist-developed formula.  Hair Styling:   Avoid tight hairstyles and excessive heat exposure.   Use a heat protectant spray when blow-drying, keeping the temperature between 350-375 degrees.  Diet: Enhance your diet with anti-inflammatory foods, such as green leafy vegetables and deeply pigmented fruits. Keep sugar and carbohydrate intake to moderate levels.  Follow-Up: We will see you again in 4 months to evaluate your progress and adjust the treatment plan as needed.  Cost: The topical treatment is $45, not covered by insurance. [facility] pharmacy will reach out to arrange delivery.  We look forward to witnessing the improvements in your next visit. Should you have any questions or concerns before then, please feel free to contact our office.  Warm regards,  Dr. Delon Lenis, Dermatology             What is central centrifugal cicatricial alopecia? Central Centrifugal Cicatricial Alopecia (CCCA) is a form of scarring alopecia on the scalp that results in permanent hair loss. It is the most common form of scarring hair loss seen in black women. However, it may be seen in  men and among persons of all races and hair colour (though rarely). Middle-aged women are most commonly affected.  What is the cause of central centrifugal cicatricial alopecia? The exact cause of CCCA is unknown and is likely multifactorial. A genetic component has been suggested, with a link to mutations of the gene PADI3, which encodes peptidyl arginine deiminase, type III (PADI3), an enzyme that modifies proteins that are essential to formation of the hair-shaft. Hair care practices, such as the use of the hot comb, relaxers, tight extensions and weaves, have been implicated for decades, but studies have not shown a consistent link. Other proposed causative factors include fungal infections, bacterial infections, autoimmune disease, and genetics. One study has shown an association with medical conditions such as type 2 diabetes mellitus.  What patients often ask their dermatologist about CCCA Board-certified dermatologists are the medical doctors who have the most experience diagnosing and treating hair loss, including CCCA. When patients see their dermatologist about CCCA, they often ask the following questions.  Why am I going bald in the center of my head? This type of hair loss often begins in the center of the scalp as a small, balding, and round patch that grows over time.  While more common in Black women, this type of hair loss develops in men and people of all races.  Central centrifugal cicatricial alopecia (CCCA) The first sign of CCCA is often noticeable hair loss in the center, or crown, of your scalp.  Woman with CCCA has hair loss in the center of her scalp If you have this type of hair loss, you want to treat it early. Starting  treatment early can prevent CCCA from spreading outward and causing more permanent hair loss. Some people also have hair regrowth when treatment starts early.  Early treatment is important because this disease destroys hair follicles. These are tiny  pores (or openings) in your scalp from which your hair grows. Once a hair follicle has been destroyed, it is replaced by scar tissue. This is why hair loss can be permanent.  You can tell when scarring develops by looking at your scalp. After many hair follicles develop scars, you'll have a bald area that feels smooth to the touch.  Can CCCA be reversed? You may be able to reverse (or grow some hair) if you treat CCCA early before hair follicles develop scars. Once a hair follicle scars completely, treatment to regrow hair becomes difficult and hair loss is more likely to be permanent.  While treatment for CCCA may not always be able to reverse the disease and regrow hair, treatment can prevent CCCA from destroying more hair follicles. This means that the patch of hair loss that you have can remain the same size instead of getting larger. Without treatment, CCCA often continues to destroy hair follicles, and the patch of hair loss becomes larger and may eventually involve most of the scalp.  How is CCCA treated? You cannot effectively treat CCCA with hair loss treatments that you can buy online or in stores. A dermatologist must prescribe medication to treat this type of hair loss.  A dermatologist can also give you self-care tips that can make treatment more effective.  Even if you don't want to treat the hair loss, it's important to see a board-certified dermatologist if you have patchy hair loss in the center of your head. Occasionally, CCCA is a sign of a medical problem like a thyroid  condition. The hair loss could also be a sign that you need more iron or certain vitamins.  If you have noticeable hair loss on the top of your head, dermatologists encourage you to make an appointment today. As tempting as it can be to hide a small area of hair loss, remember that the small area tends to get larger and larger without treatment.  Why does the baldness start from the top of the head? Exactly why  CCCA usually starts on the top of the head is not completely understood.  In studying CCCA, dermatologists have learned that it is a unique type of hair loss that usually starts on the top of the head. As CCCA progresses, the round patch grows.  Studies have also found that:  Where this type of hair loss develops, there's inflammation.  CCCA is the most common type of scarring hair loss for women of African descent.  In the [place] , it's the most frequent cause of scarring hair loss in African American women and usually begins during middle age.  CCCA runs in families.  Noticeable hair loss is one sign of CCCA. Some people feel small, raised bumps on their scalp. Many people who have untreated CCCA say that their scalp burns, stings, or itches.  You may develop other signs or symptoms. You'll find more information about these, along with pictures at Central centrifugal cicatricial alopecia: Symptoms.    Important Information  Due to recent changes in healthcare laws, you may see results of your pathology and/or laboratory studies on MyChart before the doctors have had a chance to review them. We understand that in some cases there may be results that are confusing or concerning to  you. Please understand that not all results are received at the same time and often the doctors may need to interpret multiple results in order to provide you with the best plan of care or course of treatment. Therefore, we ask that you please give us  2 business days to thoroughly review all your results before contacting the office for clarification. Should we see a critical lab result, you will be contacted sooner.   If You Need Anything After Your Visit  If you have any questions or concerns for your doctor, please call our main line at [phone] If no one answers, please leave a voicemail as directed and we will return your call as soon as possible. Messages left after 4 pm will be answered the  following business day.   You may also send us  a message via MyChart. We typically respond to MyChart messages within 1-2 business days.  For prescription refills, please ask your pharmacy to contact our office. Our fax number is [phone].  If you have an urgent issue when the clinic is closed that cannot wait until the next business day, you can page your doctor at the number below.    Please note that while we do our best to be available for urgent issues outside of office hours, we are not available 24/7.   If you have an urgent issue and are unable to reach us , you may choose to seek medical care at your doctor's office, retail clinic, urgent care center, or emergency room.  If you have a medical emergency, please immediately call 911 or go to the emergency department. In the event of inclement weather, please call our main line at [phone] for an update on the status of any delays or closures.  Dermatology Medication Tips: Please keep the boxes that topical medications come in in order to help keep track of the instructions about where and how to use these. Pharmacies typically print the medication instructions only on the boxes and not directly on the medication tubes.   If your medication is too expensive, please contact our office at [phone] or send us  a message through MyChart.   We are unable to tell what your co-pay for medications will be in advance as this is different depending on your insurance coverage. However, we may be able to find a substitute medication at lower cost or fill out paperwork to get insurance to cover a needed medication.   If a prior authorization is required to get your medication covered by your insurance company, please allow us  1-2 business days to complete this process.  Drug prices often vary depending on where the prescription is filled and some pharmacies may offer cheaper prices.  The website www.goodrx.com contains coupons for  medications through different pharmacies. The prices here do not account for what the cost may be with help from insurance (it may be cheaper with your insurance), but the website can give you the price if you did not use any insurance.  - You can print the associated coupon and take it with your prescription to the pharmacy.  - You may also stop by our office during regular business hours and pick up a GoodRx coupon card.  - If you need your prescription sent electronically to a different pharmacy, notify our office through [facility] or by phone at [phone]

## 2023-10-11 ENCOUNTER — Ambulatory Visit: Payer: Commercial Managed Care - PPO | Admitting: Dermatology

## 2023-10-26 ENCOUNTER — Other Ambulatory Visit (HOSPITAL_COMMUNITY): Payer: Self-pay

## 2023-10-26 ENCOUNTER — Other Ambulatory Visit: Payer: Self-pay

## 2023-10-26 MED ORDER — CELECOXIB 200 MG PO CAPS
200.0000 mg | ORAL_CAPSULE | Freq: Every day | ORAL | 2 refills | Status: DC
  Filled 2023-10-26: qty 90, 90d supply, fill #0
  Filled 2024-01-25 – 2024-02-21 (×2): qty 90, 90d supply, fill #1
  Filled 2024-05-17 – 2024-06-04 (×2): qty 90, 90d supply, fill #2

## 2023-11-14 ENCOUNTER — Ambulatory Visit (INDEPENDENT_AMBULATORY_CARE_PROVIDER_SITE_OTHER): Admitting: Family Medicine

## 2023-11-14 ENCOUNTER — Encounter: Payer: Self-pay | Admitting: Family Medicine

## 2023-11-14 VITALS — BP 112/80 | Ht 63.0 in | Wt 150.0 lb

## 2023-11-14 DIAGNOSIS — G8929 Other chronic pain: Secondary | ICD-10-CM | POA: Diagnosis not present

## 2023-11-14 DIAGNOSIS — M25562 Pain in left knee: Secondary | ICD-10-CM

## 2023-11-14 DIAGNOSIS — M170 Bilateral primary osteoarthritis of knee: Secondary | ICD-10-CM | POA: Diagnosis not present

## 2023-11-14 DIAGNOSIS — M25561 Pain in right knee: Secondary | ICD-10-CM | POA: Diagnosis not present

## 2023-11-14 NOTE — Patient Instructions (Addendum)
 [facility] Address: [address], [place], [place] [address] Phone: [phone]

## 2023-11-14 NOTE — Progress Notes (Signed)
 DATE OF VISIT: 11/14/2023        Erin Hall DOB: 1961-06-09 MRN: [id number]  CC:  bilateral knee pain  History- Erin Hall is a 63 y.o. female for evaluation and treatment of b/l knee pain Chronic knee pain for many years Hx of leg length discrepancy s/p surgery when she was younger and sixth/seventh grade -Surgery was completed on the left leg She has had a chronic issues with the knees and has been following with [facility]-Dr. Charlann Boxer Has completed multiple cortisone injections in each knees with minimal improvement Has completed multiple HA injection series of the knees with minimal improvement History of bilateral knee arthroscopy, last about 6 to 7 years ago No recent physical therapy, last was at the time of her last knee surgery Feels that her legs are weaker, would be interested in dedicated home exercise program to improve strengthening Is here today to explore all potential treatment options that might be available  Past Medical History Past Medical History:  Diagnosis Date   DJD (degenerative joint disease)    Family history of breast cancer    Family history of colon cancer    History of cellulitis    DOG BITE RIGHT THIGH 2002   History of colon polyps    History of positive PPD    History of seasonal allergies    History of uterine fibroid    Hyperlipidemia    hx of   Hypothyroidism    Multinodular goiter    followed by endocrinologist Dr Talmage Nap, on suppressive supplement therapy   Right knee meniscal tear     Past Surgical History Past Surgical History:  Procedure Laterality Date   BILATERAL SALPINGECTOMY N/A 12/12/2013   Procedure: BILATERAL SALPINGECTOMY;  Surgeon: Serita Kyle, MD;  Location: [facility];  Service: Gynecology;  Laterality: N/A;   BREAST REDUCTION SURGERY Bilateral 02-08-2007   COLONOSCOPY     I & D RIGHT THIGH ABSCESS  12-04-2000   KNEE ARTHROSCOPY Left several -- last one 2008   KNEE ARTHROSCOPY Right 08/20/2015    Procedure: ARTHROSCOPY RIGHT KNEE/ MEDIAL AND LATERAL PARTIAL MENISCECTOMY/ MEDIAL AND LATERAL PARTIAL CHONDROPLASTY/ BILATERAL TROCH INJECTION;  Surgeon: Durene Romans, MD;  Location: [facility];  Service: Orthopedics;  Laterality: Right;  AND BILATERAL HIPS   KNEE SURGERY Left age 41   osteomalacia   ROBOTIC ASSISTED TOTAL HYSTERECTOMY N/A 12/12/2013   Procedure: ROBOTIC ASSISTED TOTAL HYSTERECTOMY;  Surgeon: Serita Kyle, MD;  Location: [facility];  Service: Gynecology;  Laterality: N/A;   SHOULDER ARTHROSCOPY WITH ROTATOR CUFF REPAIR AND SUBACROMIAL DECOMPRESSION Right 01/25/2018   Procedure: RIGHT SHOULDER ARTHROSCOPY WITH ROTATOR CUFF REPAIR AND SUBACROMIAL DECOMPRESSION, DISTAL CLAVICLE RESECTION;  Surgeon: Francena Hanly, MD;  Location: [facility];  Service: Orthopedics;  Laterality: Right;   TONSILLECTOMY     UTERINE ARTERY EMBOLIZATION  04/17/2012   WISDOM TOOTH EXTRACTION      Medications Current Outpatient Medications  Medication Sig Dispense Refill   ALPRAZolam (XANAX) 0.5 MG tablet Take 1 tablet (0.5 mg total) by mouth at bedtime as needed for sleep (Do not fill early) 30 tablet 2   ALPRAZolam (XANAX) 0.5 MG tablet Take 1 tablet (0.5 mg total) by mouth at bedtime as needed for sleep. 30 tablet 2   atorvastatin (LIPITOR) 10 MG tablet Take 1 tablet (10 mg total) by mouth daily. 90 tablet 3   Biotin w/ Vitamins C & E (HAIR/SKIN/NAILS PO) Take 1 tablet by mouth daily.     celecoxib (CELEBREX)  200 MG capsule Take 1 capsule (200 mg total) by mouth daily. Need to take pepcid (famotidine) daily with this to prevent GI issues. 90 capsule 2   cetirizine (ZYRTEC) 10 MG tablet Take 10 mg by mouth every evening.     Cholecalciferol (VITAMIN D3) 5000 units CAPS Take 10,000 Units by mouth daily.     cyclobenzaprine (FLEXERIL) 10 MG tablet Take 1 tablet (10 mg total) by mouth 3 (three) times daily as needed for muscle spasms. 30 tablet 0   estradiol (VIVELLE-DOT) 0.1 MG/24HR patch Place  1 patch (0.1 mg total) onto the skin 2 (two) times a week. 24 patch 4   fish oil-omega-3 fatty acids 1000 MG capsule Take 1 g by mouth every evening.     levothyroxine (SYNTHROID) 50 MCG tablet Take 1 tablet (50 mcg total) by mouth in the morning on an empty stomach 90 tablet 4   magnesium oxide (MAG-OX) 400 MG tablet Take 400 mg by mouth daily.     meloxicam (MOBIC) 7.5 MG tablet Take 1 tablet (7.5 mg total) by mouth 2 (two) times daily for 2 weeks, then as needed 60 tablet 0   MINIVELLE 0.1 MG/24HR patch Place 1 patch onto the skin 2 (two) times a week. Wednesdays and Saturdays.  2   Multiple Vitamin (MULTIVITAMIN WITH MINERALS) TABS Take 1 tablet by mouth every evening.     Safety Seal Miscellaneous MISC AA Gel with Minoxidil USP 10% and Clobetasol USP 0.05% - use on affected areas daily every morning. 30 g 2   vitamin C (ASCORBIC ACID) 500 MG tablet Take 500 mg by mouth every evening.     No current facility-administered medications for this visit.    Allergies has no known allergies.  Family History - reviewed per EMR and intake form  Social History   reports no history of alcohol use.  reports that she has never smoked. She has never used smokeless tobacco.  reports no history of drug use. 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  Previously worked with [employer]   EXAM: Vitals: BP 112/80   Ht 5\' 3"  (1.6 m)   Wt 150 lb (68 kg)   LMP 02/27/2014   BMI 26.57 kg/m  General: AOx3, NAD, pleasant SKIN: no rashes or lesions, skin clean, dry, intact MSK: Knees: Left knee with well-healed medial surgical scar from prior likely surgery.  She has no swelling or effusion.  Has full range of motion with 1+ patellofemoral crepitus.  She is tender palpation along the medial joint line.  No lateral joint line tenderness.  Negative McMurray, negative Lachman, negative varus/valgus stress. Right knee with  full range of motion with 1+ patellofemoral crepitus.  No swelling or effusion.  Has mild medial joint line tenderness.  No lateral joint line tenderness.  Negative McMurray, negative Lachman, negative varus/valgus stress. Walking with a normal gait Neurovascularly intact distally  IMAGING: MRI Rt knee w/o contrast 03/23/15 showing: IMPRESSION: 1. Maceration of the anterior horn of the lateral meniscus at the anterior root. 2. Progressive mild to moderate lateral compartment osteoarthritis. Mild medial and patellofemoral compartment osteoarthritis. 3. Large effusion with degenerative synovitis. 4. Leaking or ruptured Baker's cyst.  MRI Lt knee w/o contrast 12/22/11 showing: IMPRESSION:  1. Radial tear of the posterior horn of the medial meniscus.  2.  Grade II sprain or partial tear of the medial collateral ligament, with surrounding edema.  3.  Lateral patellar tilt  and mild lateral patellar subluxation.  There is some mild thinning of the lateral patellofemoral articular  cartilage, possibly from mild excessive lateral pressure.  The  lateral patellar retinaculum appears thickened and irregular, query  lateral retinacular release versus prior lateral retinacular tear.  4.  Trace knee effusion with small Baker's cyst.  5.  Medial compartmental osteoarthritis.   Assessment & Plan Chronic pain of both knees Chronic bilateral knee pain with known osteoarthritis, status post arthroscopy in both knees, also history of left leg length discrepancy procedure when she was younger -Limited improvement with multiple cortisone injections in the past -Limited improvement with multiple hyaluronic acid injections in the past -No recent x-ray imaging  Plan: -Prior MRI results reviewed as noted above - Will obtain updated x-rays of both knees standing films to assess underlying degree of arthritis -Reviewed potential treatment options including: Repeat HA injections; PRP injections; knee replacement.   Will be better able to discuss options pending imaging results -Was given a home exercise program to improve strength and stability around the knee -Follow-up pending imaging to discuss further treatment Primary osteoarthritis of both knees Chronic bilateral knee pain with known osteoarthritis, status post arthroscopy in both knees, also history of left leg length discrepancy procedure when she was younger -MRI right knee 2016 showing mild to moderate OA -MRI left knee 2013 showing OA -Limited improvement with multiple cortisone injections in the past -Limited improvement with multiple hyaluronic acid injections in the past -No recent x-ray imaging  Plan: -Prior MRI results reviewed as noted above - Will obtain updated x-rays of both knees standing films to assess underlying degree of arthritis -Reviewed potential treatment options including: Repeat HA injections; PRP injections; knee replacement.  Will be better able to discuss options pending imaging results -Was given a home exercise program to improve strength and stability around the knee -Follow-up pending imaging to discuss further treatment  Patient expressed understanding & agreement with above.  Encounter Diagnoses  Name Primary?   Chronic pain of both knees Yes   Primary osteoarthritis of both knees     Orders Placed This Encounter  Procedures   DG Knee 4 Views W/Patella Right   DG Knee 4 Views W/Patella Left    Orders Placed This Encounter  Procedures   DG Knee 4 Views W/Patella Right   DG Knee 4 Views W/Patella Left

## 2023-11-14 NOTE — Assessment & Plan Note (Signed)
 Chronic bilateral knee pain with known osteoarthritis, status post arthroscopy in both knees, also history of left leg length discrepancy procedure when she was younger -MRI right knee 2016 showing mild to moderate OA -MRI left knee 2013 showing OA -Limited improvement with multiple cortisone injections in the past -Limited improvement with multiple hyaluronic acid injections in the past -No recent x-ray imaging  Plan: -Prior MRI results reviewed as noted above - Will obtain updated x-rays of both knees standing films to assess underlying degree of arthritis -Reviewed potential treatment options including: Repeat HA injections; PRP injections; knee replacement.  Will be better able to discuss options pending imaging results -Was given a home exercise program to improve strength and stability around the knee -Follow-up pending imaging to discuss further treatment

## 2023-11-20 ENCOUNTER — Other Ambulatory Visit (HOSPITAL_COMMUNITY): Payer: Self-pay

## 2023-11-24 ENCOUNTER — Other Ambulatory Visit: Payer: Self-pay

## 2024-01-03 ENCOUNTER — Other Ambulatory Visit (HOSPITAL_COMMUNITY): Payer: Self-pay

## 2024-01-04 ENCOUNTER — Other Ambulatory Visit (HOSPITAL_COMMUNITY): Payer: Self-pay

## 2024-01-04 ENCOUNTER — Encounter (HOSPITAL_COMMUNITY): Payer: Self-pay

## 2024-01-05 ENCOUNTER — Other Ambulatory Visit (HOSPITAL_COMMUNITY): Payer: Self-pay

## 2024-01-05 MED ORDER — ALPRAZOLAM 0.5 MG PO TABS
0.5000 mg | ORAL_TABLET | Freq: Every evening | ORAL | 2 refills | Status: DC | PRN
  Filled 2024-01-05: qty 30, 30d supply, fill #0
  Filled 2024-02-21: qty 30, 30d supply, fill #1
  Filled 2024-03-27: qty 30, 30d supply, fill #2

## 2024-01-08 ENCOUNTER — Other Ambulatory Visit (HOSPITAL_COMMUNITY): Payer: Self-pay

## 2024-01-08 DIAGNOSIS — L658 Other specified nonscarring hair loss: Secondary | ICD-10-CM | POA: Diagnosis not present

## 2024-01-08 DIAGNOSIS — L6681 Central centrifugal cicatricial alopecia: Secondary | ICD-10-CM | POA: Diagnosis not present

## 2024-01-08 MED ORDER — MINOXIDIL 2.5 MG PO TABS
1.2500 mg | ORAL_TABLET | Freq: Every day | ORAL | 4 refills | Status: AC
  Filled 2024-01-08: qty 45, 90d supply, fill #0
  Filled 2024-02-21: qty 45, 90d supply, fill #1
  Filled 2024-06-05: qty 45, 90d supply, fill #2

## 2024-01-25 ENCOUNTER — Other Ambulatory Visit (HOSPITAL_COMMUNITY): Payer: Self-pay

## 2024-01-25 ENCOUNTER — Ambulatory Visit: Payer: Commercial Managed Care - PPO | Admitting: Dermatology

## 2024-01-25 DIAGNOSIS — M25552 Pain in left hip: Secondary | ICD-10-CM | POA: Diagnosis not present

## 2024-01-25 DIAGNOSIS — M170 Bilateral primary osteoarthritis of knee: Secondary | ICD-10-CM | POA: Diagnosis not present

## 2024-01-25 DIAGNOSIS — M25551 Pain in right hip: Secondary | ICD-10-CM | POA: Diagnosis not present

## 2024-02-06 ENCOUNTER — Other Ambulatory Visit (HOSPITAL_COMMUNITY): Payer: Self-pay

## 2024-02-21 ENCOUNTER — Other Ambulatory Visit: Payer: Self-pay

## 2024-02-21 ENCOUNTER — Other Ambulatory Visit (HOSPITAL_COMMUNITY): Payer: Self-pay

## 2024-02-21 MED ORDER — LEVOTHYROXINE SODIUM 50 MCG PO TABS
50.0000 ug | ORAL_TABLET | Freq: Every morning | ORAL | 4 refills | Status: AC
  Filled 2024-02-21 – 2024-06-04 (×2): qty 90, 90d supply, fill #0

## 2024-02-21 MED ORDER — LEVOTHYROXINE SODIUM 50 MCG PO TABS
50.0000 ug | ORAL_TABLET | Freq: Every morning | ORAL | 4 refills | Status: AC
  Filled 2024-02-21: qty 90, 90d supply, fill #0

## 2024-02-22 ENCOUNTER — Other Ambulatory Visit: Payer: Self-pay | Admitting: Endocrinology

## 2024-02-22 DIAGNOSIS — E049 Nontoxic goiter, unspecified: Secondary | ICD-10-CM

## 2024-03-26 ENCOUNTER — Other Ambulatory Visit (HOSPITAL_COMMUNITY): Payer: Self-pay

## 2024-03-27 ENCOUNTER — Other Ambulatory Visit: Payer: Self-pay

## 2024-03-27 ENCOUNTER — Other Ambulatory Visit (HOSPITAL_COMMUNITY): Payer: Self-pay

## 2024-03-29 ENCOUNTER — Other Ambulatory Visit (HOSPITAL_COMMUNITY): Payer: Self-pay

## 2024-04-05 ENCOUNTER — Other Ambulatory Visit (HOSPITAL_COMMUNITY): Payer: Self-pay

## 2024-04-09 ENCOUNTER — Ambulatory Visit
Admission: RE | Admit: 2024-04-09 | Discharge: 2024-04-09 | Disposition: A | Discharge status: Home / Self Care | Source: Ambulatory Visit | Attending: Endocrinology | Admitting: Endocrinology

## 2024-04-09 DIAGNOSIS — E049 Nontoxic goiter, unspecified: Secondary | ICD-10-CM

## 2024-04-09 DIAGNOSIS — Z1231 Encounter for screening mammogram for malignant neoplasm of breast: Secondary | ICD-10-CM | POA: Diagnosis not present

## 2024-04-09 DIAGNOSIS — R7301 Impaired fasting glucose: Secondary | ICD-10-CM | POA: Diagnosis not present

## 2024-04-09 DIAGNOSIS — E041 Nontoxic single thyroid nodule: Secondary | ICD-10-CM | POA: Diagnosis not present

## 2024-04-09 DIAGNOSIS — E7800 Pure hypercholesterolemia, unspecified: Secondary | ICD-10-CM | POA: Diagnosis not present

## 2024-04-09 DIAGNOSIS — E039 Hypothyroidism, unspecified: Secondary | ICD-10-CM | POA: Diagnosis not present

## 2024-04-15 DIAGNOSIS — M25562 Pain in left knee: Secondary | ICD-10-CM | POA: Diagnosis not present

## 2024-04-15 DIAGNOSIS — M25561 Pain in right knee: Secondary | ICD-10-CM | POA: Diagnosis not present

## 2024-04-16 ENCOUNTER — Other Ambulatory Visit (HOSPITAL_COMMUNITY): Payer: Self-pay

## 2024-04-16 DIAGNOSIS — E663 Overweight: Secondary | ICD-10-CM | POA: Diagnosis not present

## 2024-04-16 DIAGNOSIS — E039 Hypothyroidism, unspecified: Secondary | ICD-10-CM | POA: Diagnosis not present

## 2024-04-16 DIAGNOSIS — R7301 Impaired fasting glucose: Secondary | ICD-10-CM | POA: Diagnosis not present

## 2024-04-16 DIAGNOSIS — E049 Nontoxic goiter, unspecified: Secondary | ICD-10-CM | POA: Diagnosis not present

## 2024-04-16 MED ORDER — LEVOTHYROXINE SODIUM 50 MCG PO TABS
50.0000 ug | ORAL_TABLET | Freq: Every morning | ORAL | 5 refills | Status: AC
  Filled 2024-04-16 – 2024-08-29 (×2): qty 90, 90d supply, fill #0

## 2024-05-02 ENCOUNTER — Other Ambulatory Visit (HOSPITAL_COMMUNITY): Payer: Self-pay

## 2024-05-03 ENCOUNTER — Encounter (HOSPITAL_COMMUNITY): Payer: Self-pay

## 2024-05-03 ENCOUNTER — Other Ambulatory Visit (HOSPITAL_COMMUNITY): Payer: Self-pay

## 2024-05-03 DIAGNOSIS — M25561 Pain in right knee: Secondary | ICD-10-CM | POA: Diagnosis not present

## 2024-05-03 DIAGNOSIS — M25562 Pain in left knee: Secondary | ICD-10-CM | POA: Diagnosis not present

## 2024-05-06 ENCOUNTER — Other Ambulatory Visit (HOSPITAL_COMMUNITY): Payer: Self-pay

## 2024-05-07 ENCOUNTER — Other Ambulatory Visit (HOSPITAL_COMMUNITY): Payer: Self-pay

## 2024-05-07 ENCOUNTER — Encounter (HOSPITAL_COMMUNITY): Payer: Self-pay

## 2024-05-08 ENCOUNTER — Other Ambulatory Visit (HOSPITAL_COMMUNITY): Payer: Self-pay

## 2024-05-08 MED ORDER — ALPRAZOLAM 0.5 MG PO TABS
0.5000 mg | ORAL_TABLET | Freq: Every evening | ORAL | 2 refills | Status: AC | PRN
  Filled 2024-05-08: qty 30, 30d supply, fill #0
  Filled 2024-06-04 – 2024-06-05 (×2): qty 30, 30d supply, fill #1

## 2024-05-09 ENCOUNTER — Other Ambulatory Visit (HOSPITAL_COMMUNITY): Payer: Self-pay

## 2024-05-13 ENCOUNTER — Other Ambulatory Visit (HOSPITAL_COMMUNITY): Payer: Self-pay

## 2024-05-15 ENCOUNTER — Other Ambulatory Visit (HOSPITAL_COMMUNITY): Payer: Self-pay

## 2024-05-15 MED ORDER — ESTRADIOL 0.1 MG/24HR TD PTTW
1.0000 | MEDICATED_PATCH | TRANSDERMAL | 4 refills | Status: AC
  Filled 2024-05-15: qty 24, 84d supply, fill #0
  Filled 2024-08-29: qty 24, 84d supply, fill #1

## 2024-05-17 ENCOUNTER — Other Ambulatory Visit (HOSPITAL_COMMUNITY): Payer: Self-pay

## 2024-05-28 ENCOUNTER — Other Ambulatory Visit: Payer: Self-pay

## 2024-05-28 ENCOUNTER — Other Ambulatory Visit (HOSPITAL_COMMUNITY): Payer: Self-pay

## 2024-06-04 ENCOUNTER — Other Ambulatory Visit (HOSPITAL_COMMUNITY): Payer: Self-pay

## 2024-06-05 ENCOUNTER — Other Ambulatory Visit (HOSPITAL_COMMUNITY): Payer: Self-pay

## 2024-06-05 ENCOUNTER — Encounter (HOSPITAL_COMMUNITY): Payer: Self-pay

## 2024-06-05 ENCOUNTER — Other Ambulatory Visit: Payer: Self-pay

## 2024-06-06 ENCOUNTER — Other Ambulatory Visit: Payer: Self-pay

## 2024-06-07 ENCOUNTER — Other Ambulatory Visit (HOSPITAL_COMMUNITY): Payer: Self-pay

## 2024-06-07 MED ORDER — ALPRAZOLAM 1 MG PO TABS
1.0000 mg | ORAL_TABLET | Freq: Every evening | ORAL | 2 refills | Status: DC | PRN
  Filled 2024-06-20: qty 30, 30d supply, fill #0
  Filled 2024-07-24: qty 30, 30d supply, fill #1
  Filled 2024-08-19 – 2024-08-23 (×2): qty 30, 30d supply, fill #2

## 2024-06-20 ENCOUNTER — Other Ambulatory Visit (HOSPITAL_COMMUNITY): Payer: Self-pay

## 2024-07-24 ENCOUNTER — Other Ambulatory Visit (HOSPITAL_COMMUNITY): Payer: Self-pay

## 2024-08-19 ENCOUNTER — Other Ambulatory Visit (HOSPITAL_COMMUNITY): Payer: Self-pay

## 2024-08-23 ENCOUNTER — Other Ambulatory Visit: Payer: Self-pay

## 2024-08-29 ENCOUNTER — Encounter (HOSPITAL_COMMUNITY): Payer: Self-pay

## 2024-08-29 ENCOUNTER — Other Ambulatory Visit: Payer: Self-pay

## 2024-08-29 ENCOUNTER — Other Ambulatory Visit (HOSPITAL_COMMUNITY): Payer: Self-pay

## 2024-08-29 MED ORDER — CELECOXIB 200 MG PO CAPS
200.0000 mg | ORAL_CAPSULE | Freq: Every day | ORAL | 2 refills | Status: AC
  Filled 2024-08-29: qty 30, 30d supply, fill #0

## 2024-09-02 ENCOUNTER — Other Ambulatory Visit (HOSPITAL_COMMUNITY): Payer: Self-pay

## 2024-09-03 ENCOUNTER — Other Ambulatory Visit (HOSPITAL_COMMUNITY): Payer: Self-pay

## 2024-09-04 ENCOUNTER — Other Ambulatory Visit (HOSPITAL_COMMUNITY): Payer: Self-pay

## 2024-09-24 ENCOUNTER — Other Ambulatory Visit: Payer: Self-pay

## 2024-09-24 ENCOUNTER — Other Ambulatory Visit (HOSPITAL_COMMUNITY): Payer: Self-pay

## 2024-09-25 ENCOUNTER — Other Ambulatory Visit (HOSPITAL_COMMUNITY): Payer: Self-pay

## 2024-09-25 MED ORDER — ALPRAZOLAM 1 MG PO TABS
1.0000 mg | ORAL_TABLET | Freq: Every evening | ORAL | 2 refills | Status: AC | PRN
  Filled 2024-09-25 – 2024-09-26 (×2): qty 30, 30d supply, fill #0

## 2024-09-26 ENCOUNTER — Other Ambulatory Visit (HOSPITAL_COMMUNITY): Payer: Self-pay
